# Patient Record
Sex: Female | Born: 1965 | Race: White | Hispanic: No | Marital: Married | Smoking: Never smoker
Health system: Southern US, Community
[De-identification: ages and names within clinical notes are randomized; demographics above are authoritative.]

## PROBLEM LIST (undated history)

## (undated) DIAGNOSIS — F319 Bipolar disorder, unspecified: Secondary | ICD-10-CM

---

## 1999-10-26 ENCOUNTER — Other Ambulatory Visit: Admission: RE | Admit: 1999-10-26 | Discharge: 1999-10-26 | Payer: Self-pay | Admitting: Obstetrics and Gynecology

## 2000-03-26 ENCOUNTER — Encounter: Admission: RE | Admit: 2000-03-26 | Discharge: 2000-03-26 | Payer: Self-pay | Admitting: Gastroenterology

## 2000-03-26 ENCOUNTER — Encounter: Payer: Self-pay | Admitting: Gastroenterology

## 2001-08-15 ENCOUNTER — Other Ambulatory Visit: Admission: RE | Admit: 2001-08-15 | Discharge: 2001-08-15 | Payer: Self-pay | Admitting: Obstetrics and Gynecology

## 2003-02-25 ENCOUNTER — Other Ambulatory Visit: Admission: RE | Admit: 2003-02-25 | Discharge: 2003-02-25 | Payer: Self-pay | Admitting: Obstetrics and Gynecology

## 2005-04-02 ENCOUNTER — Other Ambulatory Visit: Admission: RE | Admit: 2005-04-02 | Discharge: 2005-04-02 | Payer: Self-pay | Admitting: Obstetrics and Gynecology

## 2006-10-29 ENCOUNTER — Other Ambulatory Visit: Admission: RE | Admit: 2006-10-29 | Discharge: 2006-10-29 | Payer: Self-pay | Admitting: Family Medicine

## 2008-01-02 ENCOUNTER — Other Ambulatory Visit: Admission: RE | Admit: 2008-01-02 | Discharge: 2008-01-02 | Payer: Self-pay | Admitting: Family Medicine

## 2008-01-13 ENCOUNTER — Encounter: Admission: RE | Admit: 2008-01-13 | Discharge: 2008-01-13 | Payer: Self-pay | Admitting: Family Medicine

## 2009-08-29 ENCOUNTER — Other Ambulatory Visit
Admission: RE | Admit: 2009-08-29 | Discharge: 2009-08-29 | Payer: Self-pay | Source: Home / Self Care | Admitting: Family Medicine

## 2010-04-01 ENCOUNTER — Encounter: Payer: Self-pay | Admitting: Family Medicine

## 2010-04-02 ENCOUNTER — Encounter: Payer: Self-pay | Admitting: Family Medicine

## 2010-11-24 ENCOUNTER — Observation Stay (HOSPITAL_COMMUNITY)
Admission: EM | Admit: 2010-11-24 | Discharge: 2010-11-26 | DRG: 035 | Disposition: A | Discharge status: Home / Self Care | Payer: BC Managed Care – PPO | Attending: Diagnostic Neuroimaging | Admitting: Diagnostic Neuroimaging

## 2010-11-24 ENCOUNTER — Emergency Department (HOSPITAL_COMMUNITY): Payer: BC Managed Care – PPO

## 2010-11-24 DIAGNOSIS — R269 Unspecified abnormalities of gait and mobility: Principal | ICD-10-CM | POA: Insufficient documentation

## 2010-11-24 DIAGNOSIS — IMO0001 Reserved for inherently not codable concepts without codable children: Secondary | ICD-10-CM | POA: Insufficient documentation

## 2010-11-24 DIAGNOSIS — F411 Generalized anxiety disorder: Secondary | ICD-10-CM | POA: Insufficient documentation

## 2010-11-24 DIAGNOSIS — R5381 Other malaise: Secondary | ICD-10-CM | POA: Insufficient documentation

## 2010-11-24 DIAGNOSIS — R5383 Other fatigue: Secondary | ICD-10-CM | POA: Insufficient documentation

## 2010-11-24 DIAGNOSIS — F319 Bipolar disorder, unspecified: Secondary | ICD-10-CM | POA: Insufficient documentation

## 2010-11-24 DIAGNOSIS — R29898 Other symptoms and signs involving the musculoskeletal system: Secondary | ICD-10-CM | POA: Insufficient documentation

## 2010-11-24 DIAGNOSIS — F909 Attention-deficit hyperactivity disorder, unspecified type: Secondary | ICD-10-CM | POA: Insufficient documentation

## 2010-11-25 ENCOUNTER — Inpatient Hospital Stay (HOSPITAL_COMMUNITY): Payer: BC Managed Care – PPO

## 2010-11-25 LAB — CBC
HCT: 40 % (ref 36.0–46.0)
Hemoglobin: 13.5 g/dL (ref 12.0–15.0)
MCH: 31.9 pg (ref 26.0–34.0)
MCHC: 33.8 g/dL (ref 30.0–36.0)
MCV: 94.6 fL (ref 78.0–100.0)
Platelets: 256 10*3/uL (ref 150–400)
RBC: 4.23 MIL/uL (ref 3.87–5.11)
RDW: 12.2 % (ref 11.5–15.5)
WBC: 4.8 10*3/uL (ref 4.0–10.5)

## 2010-11-25 LAB — CSF CELL COUNT WITH DIFFERENTIAL
RBC Count, CSF: 1 /mm3 — ABNORMAL HIGH
Tube #: 1
WBC, CSF: 1 /mm3 (ref 0–5)

## 2010-11-25 LAB — COMPREHENSIVE METABOLIC PANEL
ALT: 18 U/L (ref 0–35)
AST: 21 U/L (ref 0–37)
Albumin: 3 g/dL — ABNORMAL LOW (ref 3.5–5.2)
Alkaline Phosphatase: 43 U/L (ref 39–117)
BUN: 14 mg/dL (ref 6–23)
CO2: 29 mEq/L (ref 19–32)
Calcium: 8.9 mg/dL (ref 8.4–10.5)
Chloride: 104 mEq/L (ref 96–112)
Creatinine, Ser: 0.95 mg/dL (ref 0.50–1.10)
GFR calc Af Amer: >60 mL/min (ref >60)
GFR calc non Af Amer: >60 mL/min (ref >60)
Glucose, Bld: 80 mg/dL (ref 70–99)
Potassium: 3.8 mEq/L (ref 3.5–5.1)
Sodium: 140 mEq/L (ref 135–145)
Total Bilirubin: 0.2 mg/dL — ABNORMAL LOW (ref 0.3–1.2)
Total Protein: 6 g/dL (ref 6.0–8.3)

## 2010-11-25 LAB — TSH: TSH: 1.395 u[IU]/mL (ref 0.350–4.500)

## 2010-11-25 LAB — PROTIME-INR
INR: 0.92 (ref 0.00–1.49)
Prothrombin Time: 12.6 seconds (ref 11.6–15.2)

## 2010-11-25 LAB — VITAMIN B12: Vitamin B-12: 448 pg/mL (ref 211–911)

## 2010-11-25 LAB — CRYPTOCOCCAL ANTIGEN, CSF: Crypto Ag: NEGATIVE

## 2010-11-25 LAB — PROTEIN AND GLUCOSE, CSF
Glucose, CSF: 74 mg/dL (ref 43–76)
Total  Protein, CSF: 22 mg/dL (ref 15–45)

## 2010-11-25 LAB — SEDIMENTATION RATE: Sed Rate: 2 mm/hr (ref 0–22)

## 2010-11-25 MED ORDER — GADOBENATE DIMEGLUMINE 529 MG/ML IV SOLN
13.0000 mL | Freq: Once | INTRAVENOUS | Status: AC | PRN
  Administered 2010-11-25: 13 mL via INTRAVENOUS

## 2010-11-26 NOTE — Consult Note (Signed)
  Tabitha Bennett, Tabitha Bennett NO.:  [id number]  MEDICAL RECORD NO.:  [id number]  LOCATION:  3035                         FACILITY:  [facility]  PHYSICIAN:  Thana Farr, MD    DATE OF BIRTH:  [birth year]  DATE OF PROCEDURE:  11/25/2010                                 PROCEDURE NOTE   The patient's MRI of the brain and MRI of the thoracic spine were reviewed.  Thoracic spine was only significant for an area of degenerative changes.  Cord was intact.  MRI of the brain shows some nonspecific white matter changes.  With these findings on MRI of the brain and the patient's gait, cannot rule out the possibility of MS. This was discussed with the patient.  It was determined that the patient would have a spinal tap performed this evening.  Procedure was explained to the patient and risk and benefits discussed as well.  Consent was signed.  The patient was placed in the seated position.  Area was cleaned with Betadine and anesthetized.  Under sterile conditions, 20- gauge LP needle was placed at approximately L3-L4 and 20 mL of clear and colorless fluid was obtained.  No complications were noted.  Fluid was sent for studies.          ______________________________ Thana Farr, MD     LR/MEDQ  D:  11/25/2010  T:  11/25/2010  Job:  [id number]  Electronically Signed by Thana Farr MD on 11/26/2010 09:32:07 AM

## 2010-11-27 LAB — PATHOLOGIST SMEAR REVIEW

## 2010-11-27 LAB — ANA: Anti Nuclear Antibody(ANA): NEGATIVE

## 2010-11-28 LAB — CSF IGG: IgG, CSF: 1 mg/dL (ref 0.8–7.7)

## 2010-11-29 LAB — CSF CULTURE W GRAM STAIN

## 2010-11-29 LAB — CSF CULTURE: Culture: NO GROWTH

## 2010-12-01 LAB — MYELIN BASIC PROTEIN, CSF: Myelin Basic Protein: <2 mcg/L (ref 0.0–4.0)

## 2010-12-01 LAB — OLIGOCLONAL BANDS, CSF + SERM

## 2010-12-07 NOTE — Discharge Summary (Signed)
[name], [name] NO.:  [id number]  MEDICAL RECORD NO.:  [id number]  LOCATION:  3035                         FACILITY:  [facility]  PHYSICIAN:  Tabitha Farr, MD   DATE OF BIRTH:  1966-02-13  DATE OF ADMISSION:  11/24/2010 DATE OF DISCHARGE:  11/26/2010                              DISCHARGE SUMMARY   ADMISSION DIAGNOSES: 1. Gait abnormality. 2. Bipolar disorder. 3. Attention deficit hyperactivity disorder.  DISCHARGE DIAGNOSES: 1. Gait disorder/inpatient neurological workup unremarkable.     Improved. 2. Bipolar disorder. 3. Attention deficit hyperactivity disorder.  HISTORY OF PRESENT ILLNESS:  [name] is a 45 year old female with a history of bipolar disorder and ADHD, who was in normal state of health until November 22, 2010, when she developed lower extremity weakness, shakiness, and difficulty walking.  She thinks she may have taken a double dose of Lamictal the evening prior, but could not be certain.  On Thursday evening, her symptoms were present and worsened through Friday morning. She drove herself to her primary care who evaluated her and recommended she come to emergency room for further evaluation. The patient was examined  by Dr. Marjory Bennett, and found her symptoms localizing to the thoracic or L-spine route.  She was admitted for further evaluation.  Of note, there is a family history of polymysitis, for which the patient had a negative work up  by her primary care provider, Dr. Laurann Bennett, in February 2012, including a TSH, CPK, LDH, aldolase, ANA, LFTs, and HIV.   COURSE OF HOSPITAL:  The patient was admitted to [facility] on November 24, 2010, with diagnoses of lower extremity weakness and gait disturbance.    MRI of the T-spine was negative except for mild chronic T7 inferior endplate deformity.  MRI of the L-spine without contrast was negative except for mild-to- moderate facet degeneration at L3-4 and L4-L5.  MRI of the  brain with and without contrast on November 25, 2010, showed no acute intracranial abnormality.  Mild age advanced nonspecific cerebral white matter signal changes and no associated enhancement. Otherwise, MRI normal appearance.  Laboratory studies in the hospital showed a white count of 4.8, hemoglobin 13.5, and platelets 256.  Sodium 140, potassium 3.8, BUN 14, creatinine 0.95.  LFTs within normal limits.  INR was normal at 0.92. Sed rate was 2.  TSH was 1.395 and B12 was 448.  The patient was seen by physical therapy during her hospital stay.  The patient was able to ambulate 86 feet with minimal guarded assistance. At times, she took short steps with upper extremities postured posteriorly.  She is able to change the speed of her gait with cueing, was able to take a long steps and slower steps.  There seemed to be intermittent ataxia of the lower extremities.  Fluctuating symptoms with inconsistent overall picture.  The patient was given closed chair lower extremity exercises and encouraged to ambulate with assistance.  PT will make final recommendations prior to discharge.  On the afternoon of November 25, 2010, the patient underwent lumbar puncture by Dr. Thad Bennett which was performed without difficulty.  A 20 gauge LP needle was placed at L3-L4 and 20 mL of clear colorless fluid was obtained and  sent for studies.  Initial results showed clear fluid with one wbc, one rbc, glucose of 74, and protein of 22.  Initial culture shows no organisms seen.  Cryptococcal antigen is negative. Fungal, acid fast, IgG, Lyme, myelin basic protein and oligoclonal bands are pending and will need to be followed up as an outpatient.  On November 26, 2010, the patient was feeling stable for discharge. She is ambulating without assistance, although leaning slightly to the right.  Her gait is steady.  Her strength is equal in her bilateral lower  extremities and deep tendon reflexes are 2-3+ in the  patellars and two plus at the Achilles.  She does complain of some left-sided neck pain which keeps her from turning her head freely.  This can be evaluated further as an outpatient.  She does have some tenderness around the trapezius muscles, the left side of the neck.  DISCHARGE/PLAN:   1. The patient will be discharged to home.  2. We will plan for outpatient neurology followup.   3. I will call physical therapy prior to her discharge to see if she requires any DME or additional outpatient physical therapy.  The patient was encouraged to perform the exercises as recommended by physical therapy.  4. No changes to her medications made.   5. 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  6. If she has any problems or questions in the interim, she needs to call her primary care provider, Dr. Cliffton Bennett or [facility].     Tabitha Bennett, P.A.   ______________________________ Tabitha Farr, MD    TCJ/MEDQ  D:  11/26/2010  T:  11/26/2010  Job:  [id number]  cc:   Tabitha Bennett, M.D. Dr. Kandace Parkins, MD  Electronically Signed by Tabitha Bennett P.A. on 11/28/2010 06:51:34 PM Electronically Signed by Tabitha Farr MD on 12/07/2010 03:18:37 PM

## 2010-12-08 LAB — B. BURGDORFI ANTIBODIES, CSF

## 2010-12-24 LAB — FUNGUS CULTURE W SMEAR: Fungal Smear: NONE SEEN

## 2010-12-29 NOTE — H&P (Signed)
Tabitha Bennett, Tabitha Bennett            ACCOUNT NO.:  [id number]  MEDICAL RECORD NO.:  [id number]  LOCATION:  [facility]                         FACILITY:  [facility]  PHYSICIAN:  Joycelyn Schmid, MD   DATE OF BIRTH:  Mar 12, 1966  DATE OF ADMISSION:  11/24/2010 DATE OF DISCHARGE:                             HISTORY & PHYSICAL   CHIEF COMPLAINT:  Lower extremity weakness and gait difficulty.  HISTORY OF PRESENT ILLNESS:  A 45 year old female with history of bipolar disorder, ADHD, who was in normal state of health until November 22, 2010, when she developed lower extremity weakness, shakiness, and difficulty walking.  She thinks she may have taken double the dose of Lamictal in that evening, but she cannot recall.  The next morning, she felt better throughout the day.  On Thursday, her symptoms returned.  On Friday morning, today, the patient's lower extremity weakness worsened again.  She drove herself to her primary care physician who evaluated her and recommended that she come to the emergency room for further evaluation.  Of note, the day prior to her evaluation, the patient had seen her primary care physician, Laurann Montana and was inquiring about her diffuse muscle weakness and fatigue. The patient's grandmother had diagnosis of polymyositis.  Then, the patient has had generalized muscle fatigue since February 2012. Therefore, she was evaluated with screening lab work including TSH, CPK, LDH, aldolase, ANA, LFTs, HIV; all of which were negative.  The patient has been under increased stress related to her work as a Education officer, [employer].  PAST MEDICAL HISTORY:  Bipolar disorder and ADHD.  MEDICATIONS: 1. Vyvanse 50 mg daily. 2. Lamictal 100 mg in the morning, 2 mg at night. 3. Citalopram 20 mg daily. 4. Xanax 0.5 mg 3 times a day.  ALLERGIES:  EFFEXOR, NUVIGIL, PAXIL, FLUVOXAMINE, ABILIFY.  FAMILY HISTORY:  Polymyositis in her grandmother.  SOCIAL HISTORY:  She lives with her  husband and 2 children.  Denies tobacco use.  She has rare alcohol use.  No illicit drug use.  REVIEW OF SYSTEMS:  As per the HPI.  No recent fevers, chills, nausea, vomiting, diarrhea, rash, chest pain.  She does have some shortness of breath on exertion that has been going on since February 2012.  She had a vaccination 1 month ago.  PHYSICAL EXAMINATION:  VITAL SIGNS:  Temperature 98.2, blood pressure 104/72, heart rate 76, respirations 16, O2 sat 100% on room air. GENERAL:  Awake and alert, thin female. NEUROLOGIC:  Language is fluent and comprehension intact.  Cranial nerve examination; pupils reactive from 3-2 mm.  Visual fields full to confrontation.  Extraocular muscles intact.  Facial sensation and strength is symmetric.  Uvula is midline.  Shoulders are symmetric. Tongue is midline.  Motor examination; normal bulk and tone, about 5/5 strength bilateral upper extremities.  Mild weakness in the triceps bilaterally.  Bilateral lower extremities give way.  Weakness of fluctuation in the bilateral hip flexors, knee flexors, and knee extension.  5/5 strength distally and dorsiflexion and plantar flexion of the foot.  Leg abduction is 5/5, leg adduction fluctuates.  At times, she is able to give momentary 5/5 strength.  Sensory examination intact to light touch, pinprick, temperature, vibration, and  proprioception. Reflexes 2+ in the arms, 3 at the knees, 2 at the ankles.  Downgoing toes.  Coordination testing, finger-nose-finger smooth and symmetric. Heel-to-shin is limited by the patient's weakness, gait.  The patient is able to sit at the bedside without difficulty.  Once she places her feet on the ground, she stoops over and begins to shake with her upper and lower body.  When I told the patient to relax, take deep breaths, and stand up right, she is able to do so without shaking.  When I ask her to take some steps, she finds her foot forward along the ground one at a time with  small steps.  When I ask her to take steps by picking her feet up off the ground, she is able to do so, although very cautiously.  She has a narrow-based gait.  Overall, I would characterize her gait and station as astasia-abasia.  She is able to take a few steps on her toes. She is able to tandem.  TEST RESULTS:  Outside lab testing includes LFTs, TSH, CPK, LDH, aldolase, ANA, HIV; all of which are negative.  MRI of the lumbar spine which I reviewed shows no spinal stenosis or foraminal narrowing.  Study is essentially normal.  ASSESSMENT AND PLAN:  A 45 year old female with bipolar disorder, attention deficit hyperactivity disorder, with new-onset lower extremity weakness.  Symptoms localize to thoracic spine or lumbosacral roots. Examination is quite variable with some functional overlay.  DIFFERENTIAL DIAGNOSES:  Thoracic spine process, Guillain-Barre syndrome, conversion reaction.  RECOMMENDATIONS:  MRI of the thoracic spine, lumbar puncture, PT evaluation.  After this testing, the patient agreed for admission.     Joycelyn Schmid, MD     VP/MEDQ  D:  11/24/2010  T:  11/24/2010  Job:  [id number]  Electronically Signed by Joycelyn Schmid  on 12/29/2010 05:44:42 PM

## 2011-01-09 LAB — AFB CULTURE WITH SMEAR (NOT AT ARMC): Acid Fast Smear: NONE SEEN

## 2012-06-24 ENCOUNTER — Other Ambulatory Visit: Payer: Self-pay | Admitting: Dermatology

## 2012-08-08 ENCOUNTER — Other Ambulatory Visit (HOSPITAL_COMMUNITY)
Admission: RE | Admit: 2012-08-08 | Discharge: 2012-08-08 | Disposition: A | Discharge status: Home / Self Care | Payer: BC Managed Care – PPO | Source: Ambulatory Visit | Attending: Family Medicine | Admitting: Family Medicine

## 2012-08-08 ENCOUNTER — Other Ambulatory Visit: Payer: Self-pay | Admitting: Family Medicine

## 2012-08-08 DIAGNOSIS — Z0000 Encounter for general adult medical examination without abnormal findings: Secondary | ICD-10-CM | POA: Insufficient documentation

## 2014-07-22 ENCOUNTER — Other Ambulatory Visit: Payer: Self-pay | Admitting: Family Medicine

## 2014-07-22 ENCOUNTER — Other Ambulatory Visit (HOSPITAL_COMMUNITY)
Admission: RE | Admit: 2014-07-22 | Discharge: 2014-07-22 | Disposition: A | Discharge status: Home / Self Care | Payer: 59 | Source: Ambulatory Visit | Attending: Family Medicine | Admitting: Family Medicine

## 2014-07-22 DIAGNOSIS — Z124 Encounter for screening for malignant neoplasm of cervix: Secondary | ICD-10-CM | POA: Diagnosis not present

## 2014-07-23 LAB — CYTOLOGY - PAP

## 2014-10-27 DIAGNOSIS — Z85828 Personal history of other malignant neoplasm of skin: Secondary | ICD-10-CM | POA: Insufficient documentation

## 2015-03-17 ENCOUNTER — Other Ambulatory Visit: Payer: Self-pay | Admitting: Family Medicine

## 2015-03-17 ENCOUNTER — Ambulatory Visit
Admission: RE | Admit: 2015-03-17 | Discharge: 2015-03-17 | Disposition: A | Discharge status: Home / Self Care | Payer: BLUE CROSS/BLUE SHIELD | Source: Ambulatory Visit | Attending: Family Medicine | Admitting: Family Medicine

## 2015-03-17 DIAGNOSIS — R0600 Dyspnea, unspecified: Secondary | ICD-10-CM

## 2016-02-20 DIAGNOSIS — Z719 Counseling, unspecified: Secondary | ICD-10-CM | POA: Insufficient documentation

## 2016-04-22 ENCOUNTER — Emergency Department (HOSPITAL_COMMUNITY): Payer: BLUE CROSS/BLUE SHIELD

## 2016-04-22 ENCOUNTER — Encounter (HOSPITAL_COMMUNITY): Payer: Self-pay | Admitting: Emergency Medicine

## 2016-04-22 ENCOUNTER — Emergency Department (HOSPITAL_COMMUNITY)
Admission: EM | Admit: 2016-04-22 | Discharge: 2016-04-23 | Disposition: A | Discharge status: Home / Self Care | Payer: BLUE CROSS/BLUE SHIELD | Attending: Emergency Medicine | Admitting: Emergency Medicine

## 2016-04-22 DIAGNOSIS — R260 Ataxic gait: Secondary | ICD-10-CM | POA: Diagnosis not present

## 2016-04-22 DIAGNOSIS — R270 Ataxia, unspecified: Secondary | ICD-10-CM

## 2016-04-22 DIAGNOSIS — R269 Unspecified abnormalities of gait and mobility: Secondary | ICD-10-CM

## 2016-04-22 DIAGNOSIS — R262 Difficulty in walking, not elsewhere classified: Secondary | ICD-10-CM | POA: Diagnosis present

## 2016-04-22 HISTORY — DX: Bipolar disorder, unspecified: F31.9

## 2016-04-22 LAB — CBC
HCT: 40.2 % (ref 36.0–46.0)
Hemoglobin: 13.5 g/dL (ref 12.0–15.0)
MCH: 32.4 pg (ref 26.0–34.0)
MCHC: 33.6 g/dL (ref 30.0–36.0)
MCV: 96.4 fL (ref 78.0–100.0)
Platelets: 264 10*3/uL (ref 150–400)
RBC: 4.17 MIL/uL (ref 3.87–5.11)
RDW: 12.4 % (ref 11.5–15.5)
WBC: 5.8 10*3/uL (ref 4.0–10.5)

## 2016-04-22 LAB — URINALYSIS, ROUTINE W REFLEX MICROSCOPIC
Bilirubin Urine: NEGATIVE
Glucose, UA: NEGATIVE mg/dL
Hgb urine dipstick: NEGATIVE
Ketones, ur: NEGATIVE mg/dL
Nitrite: NEGATIVE
Protein, ur: NEGATIVE mg/dL
Specific Gravity, Urine: 1.005 (ref 1.005–1.030)
pH: 6 (ref 5.0–8.0)

## 2016-04-22 LAB — BASIC METABOLIC PANEL
Anion gap: 6 (ref 5–15)
BUN: 10 mg/dL (ref 6–20)
CO2: 28 mmol/L (ref 22–32)
Calcium: 9.1 mg/dL (ref 8.9–10.3)
Chloride: 103 mmol/L (ref 101–111)
Creatinine, Ser: 1.02 mg/dL — ABNORMAL HIGH (ref 0.44–1.00)
GFR calc Af Amer: >60 mL/min (ref >60)
GFR calc non Af Amer: >60 mL/min (ref >60)
Glucose, Bld: 110 mg/dL — ABNORMAL HIGH (ref 65–99)
Potassium: 3.8 mmol/L (ref 3.5–5.1)
Sodium: 137 mmol/L (ref 135–145)

## 2016-04-22 LAB — I-STAT BETA HCG BLOOD, ED (MC, WL, AP ONLY): I-stat hCG, quantitative: <5 m[IU]/mL (ref <5)

## 2016-04-22 LAB — CBG MONITORING, ED: Glucose-Capillary: 93 mg/dL (ref 65–99)

## 2016-04-22 LAB — LITHIUM LEVEL: Lithium Lvl: <0.06 mmol/L — ABNORMAL LOW (ref 0.60–1.20)

## 2016-04-22 NOTE — ED Provider Notes (Signed)
[facility]-EMERGENCY DEPT Provider Note   CSN: [id number] Arrival date & time: 04/22/16  1352     History   Chief Complaint Chief Complaint  Patient presents with  . Weakness    HPI Tabitha Bennett is a 51 y.o. female.  Patient with history of bipolar disorder on Lamictal and escitalopram for 20 years -- presents with trouble walking and lower extremity weakness. Patient reports having difficulty with her memory over the past 1 year. Patient saw an integrative medicine specialist 2 days ago prescribed multiple supplements which she took once 2 days ago. Upon awaking yesterday, patient got out of bed and had difficulty with ambulation. She feels like her legs are rigid. She has not fallen. She does not feel particularly off-balance. She has not had symptoms like this in the past. She denies difficulty speaking, facial droop. She does not feel like her legs are weak. She has significant tremors when she walks or stands up. Patient reports having a minor neck injury in December after lifting heavy object. Patient had some minimal tingling in her right arm after this this resolved with chiropractic treatment. She has had continued minor right neck soreness. The onset of this condition was acute. The course is constant. Aggravating factors: none. Alleviating factors: none.   Supplements: super omega 3, lithium orotate, daily vitamin, pregnenolone, ashwaghanda, vitamin E, naltrexone, vitamin C, taurine, co q10, topical magnesium, chinese skullcap, alpha lipoic acid, l-acetyl carnitine, niacinamide, CBD capsule, benfotiamine, pantothenic acid, vinpocetine, bacopa, melatonin, 5-HTP, coriander.       Past Medical History:  Diagnosis Date  . Bipolar 1 disorder (HCC)     There are no active problems to display for this patient.   History reviewed. No pertinent surgical history.  OB History    No data available       Home Medications    Prior to Admission medications   Not on File     Family History No family history on file.  Social History Social History  Substance Use Topics  . Smoking status: Never Smoker  . Smokeless tobacco: Never Used  . Alcohol use Yes     Comment: 1 glass of wine per week     Allergies   Patient has no allergy information on record.   Review of Systems Review of Systems  Constitutional: Negative for fever.  HENT: Negative for congestion, dental problem, rhinorrhea, sinus pressure and sore throat.   Eyes: Negative for photophobia, discharge, redness and visual disturbance.  Respiratory: Negative for cough and shortness of breath.   Cardiovascular: Negative for chest pain.  Gastrointestinal: Negative for abdominal pain, diarrhea, nausea and vomiting.  Genitourinary: Negative for dysuria.  Musculoskeletal: Positive for gait problem. Negative for myalgias, neck pain and neck stiffness.  Skin: Negative for rash.  Neurological: Positive for tremors (with standing/walking) and weakness. Negative for dizziness, seizures, syncope, facial asymmetry, speech difficulty, light-headedness, numbness and headaches.  Psychiatric/Behavioral: Positive for decreased concentration. Negative for confusion.     Physical Exam Updated Vital Signs BP 124/80 (BP Location: Right Arm)   Pulse 69   Temp 98.4 F (36.9 C) (Oral)   Resp 12   Ht 5\' 7"  (1.702 m)   Wt 59.6 kg   SpO2 99%   BMI 20.60 kg/m   Physical Exam  Constitutional: She is oriented to person, place, and time. She appears well-developed and well-nourished.  HENT:  Head: Normocephalic and atraumatic.  Right Ear: Tympanic membrane, external ear and ear canal normal.  Left Ear: Tympanic  membrane, external ear and ear canal normal.  Nose: Nose normal.  Mouth/Throat: Uvula is midline, oropharynx is clear and moist and mucous membranes are normal.  Eyes: Conjunctivae, EOM and lids are normal. Pupils are equal, round, and reactive to light. Right eye exhibits no nystagmus. Left eye  exhibits no nystagmus.  Neck: Normal range of motion. Neck supple.  Cardiovascular: Normal rate and regular rhythm.   Pulmonary/Chest: Effort normal and breath sounds normal.  Abdominal: Soft. There is no tenderness.  Musculoskeletal:       Cervical back: She exhibits normal range of motion, no tenderness and no bony tenderness.  Neurological: She is alert and oriented to person, place, and time. She has normal strength. No cranial nerve deficit or sensory deficit. She displays a negative Romberg sign. Coordination and gait normal. GCS eye subscore is 4. GCS verbal subscore is 5. GCS motor subscore is 6.  Reflex Scores:      Tricep reflexes are 2+ on the right side and 2+ on the left side.      Brachioradialis reflexes are 2+ on the right side and 2+ on the left side.      Patellar reflexes are 4+ on the right side and 4+ on the left side. Normal finger-to-nose. Patient can stand without difficulty. Can stay upright with difficulty with Romberg. Walks with shaking in her legs, slides her feet along the floor slowly. Normal distal sensation. Cranial nerves intact.   Skin: Skin is warm and dry.  Psychiatric: She has a normal mood and affect.  Nursing note and vitals reviewed.    ED Treatments / Results  Labs (all labs ordered are listed, but only abnormal results are displayed) Labs Reviewed  BASIC METABOLIC PANEL - Abnormal; Notable for the following:       Result Value   Glucose, Bld 110 (*)    Creatinine, Ser 1.02 (*)    All other components within normal limits  URINALYSIS, ROUTINE W REFLEX MICROSCOPIC - Abnormal; Notable for the following:    Color, Urine STRAW (*)    Leukocytes, UA TRACE (*)    Bacteria, UA FEW (*)    Squamous Epithelial / LPF 0-5 (*)    All other components within normal limits  LITHIUM LEVEL - Abnormal; Notable for the following:    Lithium Lvl <0.06 (*)    All other components within normal limits  CBC  VITAMIN B12  ZINC  COPPER, SERUM  CBG MONITORING,  ED  I-STAT BETA HCG BLOOD, ED ([facility], [facility], [facility] ONLY)    Radiology Ct Head Wo Contrast  Result Date: 04/22/2016 CLINICAL DATA:  Lower extremity weakness, ataxia EXAM: CT HEAD WITHOUT CONTRAST TECHNIQUE: Contiguous axial images were obtained from the base of the skull through the vertex without intravenous contrast. COMPARISON:  None. FINDINGS: Brain: No evidence of acute infarction, hemorrhage, hydrocephalus, extra-axial collection or mass lesion/mass effect. Subcortical white matter and periventricular small vessel ischemic changes. Vascular: No hyperdense vessel or unexpected calcification. Skull: Normal. Negative for fracture or focal lesion. Sinuses/Orbits: No acute finding. Other: None. IMPRESSION: No evidence of acute intracranial abnormality. Mild small vessel ischemic changes. Electronically Signed   By: Charline Bills M.D.   On: 04/22/2016 17:47   Mr Brain Wo Contrast  Result Date: 04/22/2016 CLINICAL DATA:  51 y/o F; weakness, ataxia, bilateral leg weakness, and unstable gait. EXAM: MRI HEAD WITHOUT CONTRAST TECHNIQUE: Multiplanar, multiecho pulse sequences of the brain and surrounding structures were obtained without intravenous contrast. COMPARISON:  11/25/2010 MRI of the  head. FINDINGS: Brain: Enlarged pituitary measuring 11 mm craniocaudal with convex superior margin, possibly in contact with the optic chiasm. In comparison with prior MRI this may be minimally increased in size. Nonspecific scattered foci of T2 FLAIR hyperintense foci predominantly in subcortical white matter, with few foci in the left cerebellar hemisphere, is are mildly progressed in comparison with the prior MRI of the brain. No lesion is identified within the brainstem, basal ganglia, or corpus callosum. No focal mass effect. No abnormal susceptibility hypointensity to indicate intracranial hemorrhage. No diffusion signal abnormality. No extra-axial collection. No hydrocephalus. Vascular: Normal flow voids. Skull and upper  cervical spine: Normal marrow signal. Sinuses/Orbits: Negative. Other: None. IMPRESSION: 1. No acute intracranial abnormality identified. 2. Foci of white matter T2 FLAIR hyperintensity probably represent mild chronic microvascular ischemic changes and are mildly progressed from 2012. 3. Enlarged pituitary gland may represent an underlying pituitary adenoma, stable to minimally increased in size from 2012. This can be further characterized with a pituitary protocol MRI of the brain with and without intravenous contrast. Electronically Signed   By: Mitzi Hansen M.D.   On: 04/22/2016 22:46   Mr Cervical Spine Wo Contrast  Result Date: 04/22/2016 CLINICAL DATA:  51 y/o  F; lower extremity weakness and ataxia. EXAM: MRI CERVICAL, THORACIC AND LUMBAR SPINE WITHOUT CONTRAST TECHNIQUE: Multiplanar and multiecho pulse sequences of the cervical spine, to include the craniocervical junction and cervicothoracic junction, and thoracic and lumbar spine, were obtained without intravenous contrast. COMPARISON:  Concurrent MRI of the brain. 11/24/2010 lumbar MRI. 11/25/2010 thoracic MRI. FINDINGS: MRI CERVICAL SPINE FINDINGS Alignment: Mild reversal of cervical lordosis and grade 1 degenerative anterolisthesis at C3-4. Vertebrae: No fracture, evidence of discitis, or bone lesion. Cord: No abnormal cord signal. Posterior Fossa, vertebral arteries, paraspinal tissues: Negative. Disc levels: C2-3: No significant disc displacement, foraminal narrowing, or canal stenosis. Mild facet hypertrophy. C3-4: Anterolisthesis with small uncovered disc bulge and mild facet hypertrophy. Mild bilateral foraminal narrowing. No significant canal stenosis. C4-5: No significant disc displacement, foraminal narrowing, or canal stenosis. C5-6: Small disc osteophyte complex with uncovertebral and facet hypertrophy. Moderate left and mild right foraminal narrowing. Mild canal stenosis. C6-7: Disc osteophyte complex eccentric to the right  and right-sided uncovertebral hypertrophy. Moderate right foraminal narrowing. No significant left foraminal narrowing or canal stenosis. C7-T1: No significant disc displacement, foraminal narrowing, or canal stenosis. MRI THORACIC SPINE FINDINGS Alignment:  Physiologic. Vertebrae: Stable T7 inferior endplate Schmorl's node. No evidence of acute discitis, fracture, or bone lesion. Cord:  Normal signal and morphology. Paraspinal and other soft tissues: Negative. Disc levels: Tiny T8-9 and T11-12 right subarticular disc protrusion without cord or nerve root contact are new. MRI LUMBAR SPINE FINDINGS Segmentation:  Standard. Alignment:  Physiologic. Vertebrae:  No fracture, evidence of discitis, or bone lesion. Conus medullaris: Extends to the L1 level and appears normal. Paraspinal and other soft tissues: Negative. Disc levels: L1-2: No significant disc displacement, foraminal narrowing, or canal stenosis. L2-3: Small disc bulge eccentric to the left. No significant foraminal narrowing or canal stenosis. L3-4: Increased moderate disc bulge eccentric to the left with moderate facet and ligamentum flavum hypertrophy. Mild bilateral foraminal and lateral recess narrowing. No significant canal stenosis. L4-5: Increased moderate disc bulge with small central protrusion and annular fissure combined with moderate facet and ligamentum flavum hypertrophy. Mild bilateral foraminal and lateral recess narrowing with mild canal stenosis. L5-S1: Minimal disc bulge and mild facet hypertrophy. No significant foraminal narrowing or canal stenosis. IMPRESSION: 1. No acute osseous abnormality  or abnormal cord signal of the cervical, thoracic, and lumbar spine. 2. No high-grade canal stenosis or foraminal narrowing of the cervical, thoracic, and lumbar spine. 3. Multilevel cervical spondylosis greatest at the C5-6 level where there is moderate left mild right foraminal narrowing and mild canal stenosis. 4. New tiny T8-9 and T11-12 right  subarticular disc protrusions without cord or nerve root contact. 5. Progression of lumbar spondylosis at L3-4 where there is mild bilateral foraminal narrowing and at L4-5 where there is mild foraminal narrowing and mild canal stenosis. Electronically Signed   By: Mitzi Hansen M.D.   On: 04/22/2016 23:03   Mr Thoracic Spine Wo Contrast  Result Date: 04/22/2016 CLINICAL DATA:  51 y/o  F; lower extremity weakness and ataxia. EXAM: MRI CERVICAL, THORACIC AND LUMBAR SPINE WITHOUT CONTRAST TECHNIQUE: Multiplanar and multiecho pulse sequences of the cervical spine, to include the craniocervical junction and cervicothoracic junction, and thoracic and lumbar spine, were obtained without intravenous contrast. COMPARISON:  Concurrent MRI of the brain. 11/24/2010 lumbar MRI. 11/25/2010 thoracic MRI. FINDINGS: MRI CERVICAL SPINE FINDINGS Alignment: Mild reversal of cervical lordosis and grade 1 degenerative anterolisthesis at C3-4. Vertebrae: No fracture, evidence of discitis, or bone lesion. Cord: No abnormal cord signal. Posterior Fossa, vertebral arteries, paraspinal tissues: Negative. Disc levels: C2-3: No significant disc displacement, foraminal narrowing, or canal stenosis. Mild facet hypertrophy. C3-4: Anterolisthesis with small uncovered disc bulge and mild facet hypertrophy. Mild bilateral foraminal narrowing. No significant canal stenosis. C4-5: No significant disc displacement, foraminal narrowing, or canal stenosis. C5-6: Small disc osteophyte complex with uncovertebral and facet hypertrophy. Moderate left and mild right foraminal narrowing. Mild canal stenosis. C6-7: Disc osteophyte complex eccentric to the right and right-sided uncovertebral hypertrophy. Moderate right foraminal narrowing. No significant left foraminal narrowing or canal stenosis. C7-T1: No significant disc displacement, foraminal narrowing, or canal stenosis. MRI THORACIC SPINE FINDINGS Alignment:  Physiologic. Vertebrae: Stable  T7 inferior endplate Schmorl's node. No evidence of acute discitis, fracture, or bone lesion. Cord:  Normal signal and morphology. Paraspinal and other soft tissues: Negative. Disc levels: Tiny T8-9 and T11-12 right subarticular disc protrusion without cord or nerve root contact are new. MRI LUMBAR SPINE FINDINGS Segmentation:  Standard. Alignment:  Physiologic. Vertebrae:  No fracture, evidence of discitis, or bone lesion. Conus medullaris: Extends to the L1 level and appears normal. Paraspinal and other soft tissues: Negative. Disc levels: L1-2: No significant disc displacement, foraminal narrowing, or canal stenosis. L2-3: Small disc bulge eccentric to the left. No significant foraminal narrowing or canal stenosis. L3-4: Increased moderate disc bulge eccentric to the left with moderate facet and ligamentum flavum hypertrophy. Mild bilateral foraminal and lateral recess narrowing. No significant canal stenosis. L4-5: Increased moderate disc bulge with small central protrusion and annular fissure combined with moderate facet and ligamentum flavum hypertrophy. Mild bilateral foraminal and lateral recess narrowing with mild canal stenosis. L5-S1: Minimal disc bulge and mild facet hypertrophy. No significant foraminal narrowing or canal stenosis. IMPRESSION: 1. No acute osseous abnormality or abnormal cord signal of the cervical, thoracic, and lumbar spine. 2. No high-grade canal stenosis or foraminal narrowing of the cervical, thoracic, and lumbar spine. 3. Multilevel cervical spondylosis greatest at the C5-6 level where there is moderate left mild right foraminal narrowing and mild canal stenosis. 4. New tiny T8-9 and T11-12 right subarticular disc protrusions without cord or nerve root contact. 5. Progression of lumbar spondylosis at L3-4 where there is mild bilateral foraminal narrowing and at L4-5 where there is mild foraminal narrowing and  mild canal stenosis. Electronically Signed   By: Mitzi Hansen  M.D.   On: 04/22/2016 23:03   Mr Lumbar Spine Wo Contrast  Result Date: 04/22/2016 CLINICAL DATA:  51 y/o  F; lower extremity weakness and ataxia. EXAM: MRI CERVICAL, THORACIC AND LUMBAR SPINE WITHOUT CONTRAST TECHNIQUE: Multiplanar and multiecho pulse sequences of the cervical spine, to include the craniocervical junction and cervicothoracic junction, and thoracic and lumbar spine, were obtained without intravenous contrast. COMPARISON:  Concurrent MRI of the brain. 11/24/2010 lumbar MRI. 11/25/2010 thoracic MRI. FINDINGS: MRI CERVICAL SPINE FINDINGS Alignment: Mild reversal of cervical lordosis and grade 1 degenerative anterolisthesis at C3-4. Vertebrae: No fracture, evidence of discitis, or bone lesion. Cord: No abnormal cord signal. Posterior Fossa, vertebral arteries, paraspinal tissues: Negative. Disc levels: C2-3: No significant disc displacement, foraminal narrowing, or canal stenosis. Mild facet hypertrophy. C3-4: Anterolisthesis with small uncovered disc bulge and mild facet hypertrophy. Mild bilateral foraminal narrowing. No significant canal stenosis. C4-5: No significant disc displacement, foraminal narrowing, or canal stenosis. C5-6: Small disc osteophyte complex with uncovertebral and facet hypertrophy. Moderate left and mild right foraminal narrowing. Mild canal stenosis. C6-7: Disc osteophyte complex eccentric to the right and right-sided uncovertebral hypertrophy. Moderate right foraminal narrowing. No significant left foraminal narrowing or canal stenosis. C7-T1: No significant disc displacement, foraminal narrowing, or canal stenosis. MRI THORACIC SPINE FINDINGS Alignment:  Physiologic. Vertebrae: Stable T7 inferior endplate Schmorl's node. No evidence of acute discitis, fracture, or bone lesion. Cord:  Normal signal and morphology. Paraspinal and other soft tissues: Negative. Disc levels: Tiny T8-9 and T11-12 right subarticular disc protrusion without cord or nerve root contact are new. MRI  LUMBAR SPINE FINDINGS Segmentation:  Standard. Alignment:  Physiologic. Vertebrae:  No fracture, evidence of discitis, or bone lesion. Conus medullaris: Extends to the L1 level and appears normal. Paraspinal and other soft tissues: Negative. Disc levels: L1-2: No significant disc displacement, foraminal narrowing, or canal stenosis. L2-3: Small disc bulge eccentric to the left. No significant foraminal narrowing or canal stenosis. L3-4: Increased moderate disc bulge eccentric to the left with moderate facet and ligamentum flavum hypertrophy. Mild bilateral foraminal and lateral recess narrowing. No significant canal stenosis. L4-5: Increased moderate disc bulge with small central protrusion and annular fissure combined with moderate facet and ligamentum flavum hypertrophy. Mild bilateral foraminal and lateral recess narrowing with mild canal stenosis. L5-S1: Minimal disc bulge and mild facet hypertrophy. No significant foraminal narrowing or canal stenosis. IMPRESSION: 1. No acute osseous abnormality or abnormal cord signal of the cervical, thoracic, and lumbar spine. 2. No high-grade canal stenosis or foraminal narrowing of the cervical, thoracic, and lumbar spine. 3. Multilevel cervical spondylosis greatest at the C5-6 level where there is moderate left mild right foraminal narrowing and mild canal stenosis. 4. New tiny T8-9 and T11-12 right subarticular disc protrusions without cord or nerve root contact. 5. Progression of lumbar spondylosis at L3-4 where there is mild bilateral foraminal narrowing and at L4-5 where there is mild foraminal narrowing and mild canal stenosis. Electronically Signed   By: Mitzi Hansen M.D.   On: 04/22/2016 23:03    Procedures Procedures (including critical care time)  Medications Ordered in ED Medications - No data to display   Initial Impression / Assessment and Plan / ED Course  I have reviewed the triage vital signs and the nursing notes.  Pertinent labs &  imaging results that were available during my care of the patient were reviewed by me and considered in my medical decision making (see  chart for details).     Patient seen and examined. Work-up initiated.   Vital signs reviewed and are as follows: BP 124/80 (BP Location: Right Arm)   Pulse 69   Temp 98.4 F (36.9 C) (Oral)   Resp 12   Ht 5\' 7"  (1.702 m)   Wt 59.6 kg   SpO2 99%   BMI 20.60 kg/m   Discussed with Dr. Juleen ChinaKohut who will see.   6:44 PM Head CT neg. Pt stable. Spoke with Dr. Grace Isaacoqeer. Advises re-imaging of the spine and brain with MRI.   Spoke later with Dr. Otelia LimesLindzen. Discussed plan for imaging. If neg, can discharge with outpatient neurology follow-up. Encouraged adding a B12 level, zinc, copper. If any grossly abnormal findings, will call back.  12:13 AM discussed MRI findings with patient and husband at bedside. We discussed minor disc bulges, white matter changes, slightly enlarged pituitary gland. Discussed that these were unlikely to cause her current symptoms.  Discussed that at this point, if patient feels comfortable, we can discharge her to home. She states that she has good support at home and feels safe with this. She will stop all supplements. [facility]. Patient encouraged to call for an appointment as well. Patient encouraged to follow-up with her primary care physician.  We discussed at length that symptoms can be unpredictable. Given findings tonight, feel that she is low risk overall for symptom progression and worsening, but I cannot assure her that symptoms won't progress. However, if she does experience any worsening, including worsening weakness, inability to walk, difficulty breathing, that she would need to return to the emergency department immediately. She states that she understands and seems reliable to do this.  Final Clinical Impressions(s) / ED Diagnoses   Final diagnoses:  Ataxia  Abnormality of gait   Patient with gait  difficulty starting acutely yesterday. Patient had similar symptoms in 2012. Prior to this occurrence, patient took multiple different supplements documented above. Unclear if any interactions are contributing. Neuro input appreciated. Imaging performed tonight without any findings that would require inpatient admission or neurosurgery consultation. Patient does not seem to be having ascending paralysis consistent with Guillain-Barr, however patient will need close neuro follow-up. We discussed return instructions as above. Patient ambulated without assistance prior to discharge.   New Prescriptions New Prescriptions   No medications on file     Renne CriglerJoshua Sherronda Sweigert, PA-C 04/23/16 0017    Raeford RazorStephen Kohut, MD 04/30/16 [phone]

## 2016-04-22 NOTE — ED Notes (Signed)
PT unable to give urine sample at this time.Will info staff when able to give

## 2016-04-22 NOTE — ED Provider Notes (Signed)
Medical screening examination/treatment/procedure(s) were conducted as a shared visit with non-physician practitioner(s) and myself.  I personally evaluated the patient during the encounter.   EKG Interpretation None      50yF with ambulatory dysfunction. On exam, she has hyperreflexia in b/l LE. Muscle tone seems normal and strength is normal but movements are somewhat spastic. +Rhomberg. Toes down going.   She denies back pain or acute trauma. No urinary retention/incontinence. Has neck pain but this sounds more chronic in nature and, if this is a spinal cord issue, symptoms correspond to a lower process.   Interestingly, she reports similar symptoms previously but not quite as severe. Per review of records, in 2012 she was seen by neurology. W/u included LP, MR thoracic/lumbar spine and brain. No clear etiology and symptoms resolved.   Symptoms/exam interesting but I don't have a clear explanation. Will discuss with neurology.    Raeford RazorStephen Jamaine Quintin, MD 04/22/16 1655

## 2016-04-22 NOTE — ED Triage Notes (Addendum)
Patient seen by a doctor and he prescribed her 22 different vitamins/herbs on Friday. Reports Saturday morning she began to feel weak and has a shuffling gait. Patient alert and oriented. Patient able to move all extremities and follow commands.

## 2016-04-22 NOTE — ED Notes (Signed)
Pt has been able to ambulate to the bathroom and back to her room with assistance by family member.

## 2016-04-22 NOTE — ED Notes (Signed)
ED Provider at bedside. 

## 2016-04-23 LAB — VITAMIN B12: Vitamin B-12: 1476 pg/mL — ABNORMAL HIGH (ref 180–914)

## 2016-04-23 NOTE — Discharge Instructions (Signed)
Please read and follow all provided instructions.  Your diagnoses today include:  1. Abnormality of gait   2. Ataxia     Tests performed today include:  CT scan of your head that did not show any serious injury.  MRI of your brain and spine - your pituitary is slightly enlarged, you have some small disc bulges, no findings that explain your trouble walking  Blood counts and electrolytes  Vital signs. See below for your results today.   Medications prescribed:   None  Take any prescribed medications only as directed.  Home care instructions:  Follow any educational materials contained in this packet.  Follow-up instructions: Please follow-up with your primary care provider in the next 3 days for further evaluation of your symptoms. You should also follow-up with the neurologist for further evaluation.   Return instructions:  SEEK IMMEDIATE MEDICAL ATTENTION IF:  You notice dizziness or unsteadiness which is getting worse, or inability to walk.   You have convulsions or unconsciousness.   You experience severe, persistent headaches not relieved by Tylenol.  There are changes in pupil sizes. (This is the black center in the colored part of the eye)   There is clear or bloody discharge from the nose or ears.   You have change in speech, vision, swallowing, or understanding.   Localized weakness, numbness, tingling, or change in bowel or bladder control.  You have any other emergent concerns.  Your vital signs today were: BP 114/76 (BP Location: Right Arm)    Pulse 62    Temp 98.4 F (36.9 C) (Oral)    Resp 20    Ht 5\' 7"  (1.702 m)    Wt 59.6 kg    SpO2 99%    BMI 20.60 kg/m  If your blood pressure (BP) was elevated above 135/85 this visit, please have this repeated by your doctor within one month. --------------

## 2016-04-25 ENCOUNTER — Telehealth: Payer: Self-pay | Admitting: Neurology

## 2016-04-25 ENCOUNTER — Encounter: Payer: Self-pay | Admitting: Neurology

## 2016-04-25 ENCOUNTER — Ambulatory Visit (INDEPENDENT_AMBULATORY_CARE_PROVIDER_SITE_OTHER): Payer: BLUE CROSS/BLUE SHIELD | Admitting: Neurology

## 2016-04-25 VITALS — BP 116/72 | HR 83 | Ht 66.0 in | Wt 129.4 lb

## 2016-04-25 DIAGNOSIS — R9389 Abnormal findings on diagnostic imaging of other specified body structures: Secondary | ICD-10-CM

## 2016-04-25 DIAGNOSIS — R938 Abnormal findings on diagnostic imaging of other specified body structures: Secondary | ICD-10-CM | POA: Diagnosis not present

## 2016-04-25 DIAGNOSIS — G3184 Mild cognitive impairment, so stated: Secondary | ICD-10-CM | POA: Diagnosis not present

## 2016-04-25 DIAGNOSIS — R930 Abnormal findings on diagnostic imaging of skull and head, not elsewhere classified: Secondary | ICD-10-CM | POA: Diagnosis not present

## 2016-04-25 LAB — ZINC: Zinc: 74 ug/dL (ref 56–134)

## 2016-04-25 LAB — COPPER, SERUM: Copper: 112 ug/dL (ref 72–166)

## 2016-04-25 NOTE — Telephone Encounter (Signed)
Pt called said her symptoms are getting worse, says she is concerned, she doesn't expect to get worked in sooner than her appt today at 2:00. Pt said she is going to come to clinic and sit in the parking lot and wait, said she wants to be close if something happens. Operator advised her to go ED as this office does not triage. Pt said she does not think ED will be of benefit, said she's had 4 MRI's and was seen at ED 2/11. Operator asked what her symptoms are, she said odd gait, arms feel heavy, head tingling. Again operator advised her to go to ED, she said her boyfriend is on the way to pick her up and they will sit in the parking lot that if an opening becomes available to be seen

## 2016-04-25 NOTE — Progress Notes (Signed)
[facility] [address] [place]. [place] [address] [phone]       OFFICE CONSULT NOTE  Ms. Halford Decamp Date of Birth:  Apr 15, 1965 Medical Record Number:  [id number]   Referring MD:  Raeford Razor Reason for Referral:  Abnormal gait HPI: Ms Tabitha Bennett is a 73 year Caucasian lady accompanied today by her [place]. History is obtained from the patient, fianc and review of ER visit notes. Patient states that about a week ago she saw of physician Dr. Reino Kent from [facility] who started her on multiple herbs. A few days after starting it she feels she may have had a reaction. She complains that her legs are not working. She has trouble walking and stepping. She is able to get up by herself. But she feels her legs and on right. She feels she is off balance. She had no falls or injuries. She also now complains of numbness and tingling in the last few days as well as heaviness in the chest. She has trouble sitting up straight. She has tingling sensation in her head. Her neck hurts. She was seen in the emergency room and 04/22/16 and was found to have brisk hyperreflexia and underwent extensive imaging studies including MRI scan of the brain, cervical, thoracic and lumbar spine which have reviewed personally. MRI scan of the brain shows multiple scattered periventricular and subcortical nonspecific white matter hyperintensities which appear to have progressed compared with previous MRI scan dated 2012. MRI scan also Michael spine shows degenerative changes at C5-6 with bulging disc but no significant compression. MRI scan of the thoracic spine also shows disc degenerative changes at T8-9 and T11-12 but no definite compression. MRI scan the lumbar spine shows spondylitic changes at L3-4 and L4-5 with mild foraminal narrowing but no significant compression again. The patient also had extensive blood work on 04/22/16 including zinc, copper, lithium levels which were normal. Vitamin B12  levels were supranormal. Basic metabolic panel, CBC was normal. Patient was evaluated earlier in September 2012 atthe hospital by Dr. Steele Berg emboli for lower extremity weakness and gait difficulty. At that time MRI scan had shown nonspecific white matter hyperintensities in the brain. She was evaluated for inflammatory, autoimmune and multiple sclerosis and workup was all negative. Lab work included a that time TSH, CPK, LDH, aldolase, ANA, LFTs, HIV all of it was negative. Spinal tap was also done and CSF was benign and oligoclonal bands were negative. MRI of the cervical and thoracic spine was also obtain a that time which was unremarkable. Patient denies any symptoms suggestive of multiple sclerosis in the form of loss of vision, blurred vision, diplopia, vertigo, panda or bowel control issues and fatigue. She does complain of cognitive impairment but trouble with attention, focusing and multitasking. She's not had detailed cognitive testing for that yet.  ROS:   14 system review of systems is positive for  fatigue, memory loss, numbness, weakness, dizziness, depression, change in appetite, disinterest in activities and all other systems negative  PMH:  Past Medical History:  Diagnosis Date  . Bipolar 1 disorder (HCC)     Social History:  Social History   Social History  . Marital status: Married    Spouse name: N/A  . Number of children: N/A  . Years of education: N/A   Occupational History  . Not on file.   Social History Main Topics  . Smoking status: Never Smoker  . Smokeless tobacco: Never Used  . Alcohol use 0.6 oz/week  1 Glasses of wine per week     Comment: 1 glass of wine per week  . Drug use: No  . Sexual activity: Not on file   Other Topics Concern  . Not on file   Social History Narrative  . No narrative on file    Medications:   Current Outpatient Prescriptions on File Prior to Visit  Medication Sig Dispense Refill  . BENFOTIAMINE PO Take 80 mg by mouth  daily.    . citalopram (CELEXA) 20 MG tablet Take 30 mg by mouth daily.  1  . estradiol (ESTRACE) 0.5 MG tablet Take 0.5 mg by mouth daily.  2  . ibuprofen (ADVIL,MOTRIN) 200 MG tablet Take 200 mg by mouth every 6 (six) hours as needed for fever, headache or cramping.    . lamoTRIgine (LAMICTAL) 100 MG tablet Take 300 mg by mouth daily. Take 200mg  by mouth in the morning, and 100mg  by mouth at bedtime  1  . Multiple Vitamin (MULTIVITAMIN WITH MINERALS) TABS tablet Take 1 tablet by mouth daily. "highly absorbable daily multivitamin" by health as it ought to be    . progesterone (PROMETRIUM) 100 MG capsule Take 200 mg by mouth at bedtime.  2   No current facility-administered medications on file prior to visit.     Allergies:  No Known Allergies  Physical Exam General: Frail middle-aged Caucasian lady, seated, in no evident distress Head: head normocephalic and atraumatic.   Neck: supple with no carotid or supraclavicular bruits Cardiovascular: regular rate and rhythm, no murmurs Musculoskeletal: no deformity Skin:  no rash/petichiae Vascular:  Normal pulses all extremities  Neurologic Exam Mental Status: Awake and fully alert. Oriented to place and time. Recent and remote memory intact. Attention span, concentration and fund of knowledge appropriate. Mood and affect appropriate.   Recall is 3/3. Animal naming 13. Clock drawing 4/4. Does not appear depressed Cranial Nerves: Fundoscopic exam reveals sharp disc margins. Pupils equal, briskly reactive to light. Extraocular movements full without nystagmus. Visual fields full to confrontation. Hearing intact. Facial sensation intact. Face, tongue, palate moves normally and symmetrically.  Motor: Normal bulk and tone. Normal strength in all tested extremity muscles. Sensory.: intact to touch , pinprick , position and vibratory sensation.  Coordination: Rapid alternating movements normal in all extremities. Finger-to-nose and heel-to-shin  performed accurately bilaterally. Gait and Station: Arises from chair without difficulty. Stance is normal. Gait is bizarre with nonorganic quality with long measured steps with stiffness but exam shows normal tone.Reflexes: 2+ brisk and symmetric except both ankle jerks are exaggerated. No clonus. Toes downgoing.       ASSESSMENT:  50 year occasional lady with new onset of gait difficulties, paresthesias of unclear etiology. Temporal relationship with starting of herbal medications makes this a possible side effect .  Neurological exam is unremarkable except for mild hyperreflexia. Abnormal MRI scan of the brain showing nonspecific.periventricular and subcortical white matter hyperintensities which appear to have progressed compared to previous scan from 2012. Mild cognitive difficulties of unclear etiology which need further evaluation    PLAN: I had a long discussion with the patient and her fianc about her recent episodes of gait and walking difficulties of sudden onset seizure of unclear etiology and perhaps related to recent starting multiple herbal medications. I also discussed with her the results of MRI scan of the brain showing multiple nonspecific white matter hyperintensities and discussed the differential diagnosis and plan for evaluation. I recommend repeat MRI scan of the brain with contrast  to  look for any enhancing lesions and check lab work for inflammatory conditions. Referred to neuropsychology for detailed cognitive testing for her mild cognitive impairment. I also advised her to see her physician from integrative medicine to discuss her recent symptoms following starting the herbal medications. Also check Transcranial Doppler bubble study to look for patent foramen ovale has similar white matter hyperintensities have been described in patients with PFO. MRI brain reports suggest an enlarged pituitary but my impression it does not appear significantly changed compared to the  previous MRI from 2012 Greater than 50% time during this 45 minute consultation visit was spent on counseling and coordination of care about her gait and multifocal symptoms, abnormal brain MRI scan, plan for evaluation and on some questions Delia HeadyPramod Lashana Spang, MD  [facility] [address] [place] [id number]  Phone [phone] Fax [phone] Note: This document was prepared with digital dictation and possible smart phrase technology. Any transcriptional errors that result from this process are unintentional.

## 2016-04-25 NOTE — Telephone Encounter (Signed)
Rn call patient back. Pt stated she is not in the parking lot but wanted to be close.Pt stated her arms are heavy and she has a weak gait. PT stated she went to the ED on 04/22/2016 and they could not find out why she is having this weakness. Pt stated her boyfriend is close by and she was just nervous. Rn stated her appt is at 0200pm with Dr. Pearlean BrownieSethi. Rn stated If she feels worse or cannot function to call 911 and seek the hospital. Pt verbalized understanding.

## 2016-04-25 NOTE — Telephone Encounter (Signed)
Called to schedule Patient's Doppler / TCD/Bubble . Patient relayed she want's to hold off on Doppler  until MRI results come back.  Dr. Pearlean BrownieSethi are you ok with this please advise Annabelle HarmanDana?

## 2016-04-25 NOTE — Patient Instructions (Addendum)
I had a long discussion with the patient and her fianc about her recent episodes of gait and walking difficulties of sudden onset seizure of unclear etiology and perhaps related to recent starting multiple herbal medications. I also discussed with her the results of MRI scan of the brain showing multiple nonspecific white matter hyperintensities and discussed the differential diagnosis and plan for evaluation. I recommend repeat MRI scan of the brain with contrast  to look for any enhancing lesions and check lab work for inflammatory conditions. Referred to neuropsychology for detailed cognitive testing for her mild cognitive impairment. I also advised her to see her physician from integrative medicine to discuss her recent symptoms following starting the herbal medications. Also check transcranial Doppler bubble study to look for patent foramen ovale has similar white matter hyperintensities have been described in patients with PFO

## 2016-04-26 ENCOUNTER — Ambulatory Visit (INDEPENDENT_AMBULATORY_CARE_PROVIDER_SITE_OTHER): Payer: Self-pay

## 2016-04-26 ENCOUNTER — Other Ambulatory Visit: Payer: BLUE CROSS/BLUE SHIELD

## 2016-04-26 ENCOUNTER — Telehealth: Payer: Self-pay | Admitting: Neurology

## 2016-04-26 ENCOUNTER — Other Ambulatory Visit: Payer: Self-pay

## 2016-04-26 DIAGNOSIS — Z0289 Encounter for other administrative examinations: Secondary | ICD-10-CM

## 2016-04-26 DIAGNOSIS — R930 Abnormal findings on diagnostic imaging of skull and head, not elsewhere classified: Secondary | ICD-10-CM

## 2016-04-26 LAB — ANA: Anti Nuclear Antibody(ANA): NEGATIVE

## 2016-04-26 LAB — B. BURGDORFI ANTIBODIES: Lyme IgG/IgM Ab: <0.91 {ISR} (ref 0.00–0.90)

## 2016-04-26 LAB — HIV ANTIBODY (ROUTINE TESTING W REFLEX): HIV Screen 4th Generation wRfx: NONREACTIVE

## 2016-04-26 LAB — ANGIOTENSIN CONVERTING ENZYME: Angio Convert Enzyme: 24 U/L (ref 14–82)

## 2016-04-26 NOTE — Telephone Encounter (Signed)
New order #[id number] placed for bubble study for pt to have [facility].

## 2016-04-26 NOTE — Telephone Encounter (Signed)
Patient is requesting to have her TCD/ Bubble done at [facility] . [phone].  Order needs to be redone to this order number [id number]  . Thanks Annabelle Harmanana.

## 2016-04-26 NOTE — Telephone Encounter (Signed)
[facility] is [facility] 05/01/2016 arrive at 2:30 for 3:00  apt.  Dr. Pearlean BrownieSethi will be in the hospital that week [facility] will reach him buy pager. Patient is having TCD/ Bubble . Patient is aware of all details.

## 2016-04-26 NOTE — Telephone Encounter (Signed)
Dr. Pearlean BrownieSethi Patient has decided to she is going to have TCD/ Bubble. Patient called this am. Thanks Annabelle Harmanana.

## 2016-04-26 NOTE — Telephone Encounter (Signed)
Ok

## 2016-05-01 ENCOUNTER — Ambulatory Visit (HOSPITAL_COMMUNITY)
Admission: RE | Admit: 2016-05-01 | Discharge: 2016-05-01 | Disposition: A | Discharge status: Home / Self Care | Payer: BLUE CROSS/BLUE SHIELD | Source: Ambulatory Visit | Attending: Neurology | Admitting: Neurology

## 2016-05-01 ENCOUNTER — Ambulatory Visit (HOSPITAL_COMMUNITY): Payer: BLUE CROSS/BLUE SHIELD

## 2016-05-01 DIAGNOSIS — R930 Abnormal findings on diagnostic imaging of skull and head, not elsewhere classified: Secondary | ICD-10-CM | POA: Insufficient documentation

## 2016-05-01 NOTE — Progress Notes (Signed)
*  PRELIMINARY RESULTS* Vascular Ultrasound Transcranial Doppler with Bubbles has been completed with Dr. Pearlean BrownieSethi. High Intensity Transient Signals (HITS) were heard at rest and with valsalva maneuver, suggestive of a medium to large PFO.  05/01/2016 4:40 PM Tabitha Bennett, BS, RVT, RDCS, RDMS

## 2016-05-03 ENCOUNTER — Emergency Department (HOSPITAL_COMMUNITY): Payer: BLUE CROSS/BLUE SHIELD

## 2016-05-03 ENCOUNTER — Encounter (HOSPITAL_COMMUNITY): Payer: Self-pay

## 2016-05-03 ENCOUNTER — Emergency Department (HOSPITAL_COMMUNITY)
Admission: EM | Admit: 2016-05-03 | Discharge: 2016-05-03 | Discharge status: Against Medical Advice | Payer: BLUE CROSS/BLUE SHIELD | Attending: Emergency Medicine | Admitting: Emergency Medicine

## 2016-05-03 DIAGNOSIS — R079 Chest pain, unspecified: Secondary | ICD-10-CM

## 2016-05-03 DIAGNOSIS — Z79899 Other long term (current) drug therapy: Secondary | ICD-10-CM | POA: Insufficient documentation

## 2016-05-03 DIAGNOSIS — R0789 Other chest pain: Secondary | ICD-10-CM | POA: Diagnosis not present

## 2016-05-03 LAB — CBC
HCT: 39.5 % (ref 36.0–46.0)
Hemoglobin: 12.8 g/dL (ref 12.0–15.0)
MCH: 31.3 pg (ref 26.0–34.0)
MCHC: 32.4 g/dL (ref 30.0–36.0)
MCV: 96.6 fL (ref 78.0–100.0)
Platelets: 279 10*3/uL (ref 150–400)
RBC: 4.09 MIL/uL (ref 3.87–5.11)
RDW: 12.3 % (ref 11.5–15.5)
WBC: 6.6 10*3/uL (ref 4.0–10.5)

## 2016-05-03 LAB — BASIC METABOLIC PANEL
Anion gap: 8 (ref 5–15)
BUN: 13 mg/dL (ref 6–20)
CO2: 26 mmol/L (ref 22–32)
Calcium: 9.2 mg/dL (ref 8.9–10.3)
Chloride: 104 mmol/L (ref 101–111)
Creatinine, Ser: 1.02 mg/dL — ABNORMAL HIGH (ref 0.44–1.00)
GFR calc Af Amer: >60 mL/min (ref >60)
GFR calc non Af Amer: >60 mL/min (ref >60)
Glucose, Bld: 100 mg/dL — ABNORMAL HIGH (ref 65–99)
Potassium: 3.8 mmol/L (ref 3.5–5.1)
Sodium: 138 mmol/L (ref 135–145)

## 2016-05-03 LAB — I-STAT TROPONIN, ED
Troponin i, poc: 0 ng/mL (ref 0.00–0.08)
Troponin i, poc: 0.01 ng/mL (ref 0.00–0.08)

## 2016-05-03 NOTE — ED Notes (Addendum)
Pt states she has a prickling feeling in her chest. Pt denies nausea, vomiting, diaphoresis. Pt states she started to have weakness. 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 Pt currently not in acute distress. Vitals stable.

## 2016-05-03 NOTE — ED Provider Notes (Signed)
[facility]-EMERGENCY DEPT Provider Note   CSN: [id number] Arrival date & time: 05/03/16  1521     History   Chief Complaint Chief Complaint  Patient presents with  . Chest Pain    HPI Tabitha Bennett is a 51 y.o. female presenting with increased heart rate and burning chest with a prickly feeling in her left upper chest and doesn't feel like she is walking as well. She states that she was sitting down at onset and felt weak and dizzy and her chest burning. She currently feels lightheaded and chest tightness. She denies shortness of breath, nausea, vomiting, leg swelling. She expires that 2 weeks ago she has had an episode of trouble walking shaky legs and weakness and had a full workup done where they did an MRI she was doing fine and back to normal 2 days ago before she had this episode today.  HPI  Past Medical History:  Diagnosis Date  . Bipolar 1 disorder [facility])     Patient Active Problem List   Diagnosis Date Noted  . Abnormal MRI 04/25/2016    History reviewed. No pertinent surgical history.  OB History    No data available       Home Medications    Prior to Admission medications   Medication Sig Start Date End Date Taking? Authorizing Provider  citalopram (CELEXA) 20 MG tablet Take 30 mg by mouth daily. 02/13/16  Yes Historical Provider, MD  estradiol (ESTRACE) 0.5 MG tablet Take 0.5 mg by mouth at bedtime.  04/20/16  Yes Historical Provider, MD  lamoTRIgine (LAMICTAL) 100 MG tablet Take 100-200 mg by mouth See admin instructions. Take 200mg  by mouth in the morning, and 100mg  by mouth at bedtime 03/19/16  Yes Historical Provider, MD  lisdexamfetamine (VYVANSE) 30 MG capsule Take 30 mg by mouth daily.   Yes Historical Provider, MD  progesterone (PROMETRIUM) 200 MG capsule Take 200 mg by mouth at bedtime.   Yes Historical Provider, MD    Family History No family history on file.  Social History Social History  Substance Use Topics  . Smoking status: Never Smoker  .  Smokeless tobacco: Never Used  . Alcohol use 0.6 oz/week    1 Glasses of wine per week     Comment: 1 glass of wine per week     Allergies   Patient has no known allergies.   Review of Systems Review of Systems  Constitutional: Negative for chills and fever.  HENT: Positive for ear pain. Negative for congestion and sore throat.   Eyes: Negative for pain and visual disturbance.  Respiratory: Positive for chest tightness. Negative for cough, shortness of breath and wheezing.   Cardiovascular: Negative for chest pain and palpitations.  Gastrointestinal: Negative for abdominal distention, abdominal pain, blood in stool, diarrhea, nausea and vomiting.  Genitourinary: Negative for difficulty urinating, dysuria and hematuria.  Musculoskeletal: Positive for neck pain. Negative for arthralgias, back pain and neck stiffness.       Reports months of left-sided neck pain which she thinks the blood vessel.  Skin: Negative for color change and rash.  Neurological: Positive for weakness and light-headedness. Negative for seizures, syncope, numbness and headaches.  All other systems reviewed and are negative.    Physical Exam Updated Vital Signs BP 116/76   Pulse 76   Temp 99.8 F (37.7 C) (Oral)   Resp 18   SpO2 100%   Physical Exam  Constitutional: She appears well-developed and well-nourished. No distress.  Afebrile, nontoxic-appearing, lying comfortably  in bed in no acute distress  HENT:  Head: Normocephalic and atraumatic.  Eyes: Conjunctivae are normal. Pupils are equal, round, and reactive to light.  Neck: Normal range of motion. Neck supple.  Cardiovascular: Normal rate, regular rhythm, normal heart sounds and intact distal pulses.   No murmur heard. Pulmonary/Chest: Effort normal and breath sounds normal. No respiratory distress. She has no wheezes. She has no rales. She exhibits no tenderness.  Abdominal: Soft. She exhibits no distension. There is no tenderness. There is no  guarding.  Musculoskeletal: Normal range of motion. She exhibits no edema.  Neurological: She is alert.  Skin: Skin is warm and dry. She is not diaphoretic. No pallor.  Psychiatric: She has a normal mood and affect.  Nursing note and vitals reviewed.    ED Treatments / Results  Labs (all labs ordered are listed, but only abnormal results are displayed) Labs Reviewed  BASIC METABOLIC PANEL - Abnormal; Notable for the following:       Result Value   Glucose, Bld 100 (*)    Creatinine, Ser 1.02 (*)    All other components within normal limits  CBC  I-STAT TROPOININ, ED  Rosezena SensorI-STAT TROPOININ, ED    EKG  EKG Interpretation None       Radiology Dg Chest 2 View  Result Date: 05/03/2016 CLINICAL DATA:  51 y/o  F; chest pain. EXAM: CHEST  2 VIEW COMPARISON:  03/17/2015 chest radiograph. FINDINGS: Stable heart size and mediastinal contours are within normal limits. Both lungs are clear. The visualized skeletal structures are unremarkable. IMPRESSION: No active cardiopulmonary disease. Electronically Signed   By: Mitzi HansenLance  Furusawa-Stratton M.D.   On: 05/03/2016 15:55    Procedures Procedures (including critical care time)  Medications Ordered in ED Medications - No data to display   Initial Impression / Assessment and Plan / ED Course  I have reviewed the triage vital signs and the nursing notes.  Pertinent labs & imaging results that were available during my care of the patient were reviewed by me and considered in my medical decision making (see chart for details).     Labs unremarkable, chest x-ray without acute cardiopulmonary process, EKG normal sinus rhythm Reassuring exam, stable vitals. Patient left AMA before workup was fully completed.  Most of the results were already discussed with patient. I had updated her, explaining that her initial troponin was negative, chest x-ray was negative and EKG normal . She was well-appearing in no acute distress, eating dinner sitting  comfortably in bed   Final Clinical Impressions(s) / ED Diagnoses   Final diagnoses:  Nonspecific chest pain    New Prescriptions Discharge Medication List as of 05/03/2016  8:59 PM       Georgiana ShoreJessica B Wanona Stare, PA-C 05/04/16 0216    Azalia BilisKevin Campos, MD 05/04/16 [id number]

## 2016-05-03 NOTE — ED Notes (Signed)
Pt's SO came to nurse's station, asking if they could leave now. I encouraged them to wait for their provider to speak with the pt and provide d/c instructions and f/u recommendations. Spoke with pt about same, but also stated it was in her right to depart AMA, understanding that she accepted the risks of such conditions. Pt agreed to acceptance of risks, and signed out AMA. Pt departed in NAD, refusing use of wheelchair.

## 2016-05-03 NOTE — ED Notes (Signed)
ED Provider at bedside. 

## 2016-05-03 NOTE — ED Notes (Signed)
Pt states she is pain free and "ready to go".

## 2016-05-03 NOTE — ED Triage Notes (Signed)
Pt reports chest heaviness and burning that started today associated with dizziness, lack of appetite and "it feels like my heart is beating fast."

## 2016-05-04 ENCOUNTER — Encounter: Payer: Self-pay | Admitting: Neurology

## 2016-05-07 ENCOUNTER — Encounter: Payer: Self-pay | Admitting: Neurology

## 2016-05-07 DIAGNOSIS — R930 Abnormal findings on diagnostic imaging of skull and head, not elsewhere classified: Secondary | ICD-10-CM | POA: Diagnosis not present

## 2016-05-08 ENCOUNTER — Ambulatory Visit (INDEPENDENT_AMBULATORY_CARE_PROVIDER_SITE_OTHER): Payer: Self-pay

## 2016-05-08 ENCOUNTER — Encounter: Payer: Self-pay | Admitting: Neurology

## 2016-05-08 DIAGNOSIS — R930 Abnormal findings on diagnostic imaging of skull and head, not elsewhere classified: Secondary | ICD-10-CM

## 2016-05-08 DIAGNOSIS — Z0289 Encounter for other administrative examinations: Secondary | ICD-10-CM

## 2016-05-09 ENCOUNTER — Other Ambulatory Visit: Payer: BLUE CROSS/BLUE SHIELD

## 2016-05-10 ENCOUNTER — Telehealth: Payer: Self-pay | Admitting: Neurology

## 2016-05-10 NOTE — Telephone Encounter (Signed)
Pt called request MRI results. Pt says she had MRI on Monday, I said Tuesday she said no Monday. I advised her it can take 4-5 days to get the results as the provider has to read the images. Pt was advised RN or provider will call once the image is read. Pt understood.

## 2016-05-10 NOTE — Telephone Encounter (Signed)
Rn call patient to let her know the MRI brain with contrast was normal. Rn also stated to patient Dr.Sethi will call her back to discuss all her test and discuss the plan for her care. PT verbalized understanding.

## 2016-05-10 NOTE — Telephone Encounter (Signed)
I returned patient's call and informed her of the results of MRI scan of the brain with contrast being normal enzyme do not believe the white matter hyperintensities on brain MRI scan from multiple sclerosis. I had also done a transcranial Doppler   bubble study which was positive indicative of a patent foramen ovale but I do not believe that this is responsible for the patient's current symptoms and do not believe PFO closure is indicated at the present time. She voiced understanding. I recommend she keep her appointment for neuropsych testing on March 29. I personally spoke to the neuropsychologist to see if the appointment could be moved earlier and she said she would put the patient on a cancellation list. The patient appears frustrated due to lack of definite diagnosis and her persistent ongoing symptoms. I offered a second opinion in town as well as a referral to a tertiary care center but the patient wants to wait.

## 2016-05-10 NOTE — Telephone Encounter (Signed)
Rn spoke with Dr.Sethi and the patients MRI was normal. PEr Dr. Pearlean BrownieSethi he will call the patient back to discuss all the test and overall plan.

## 2016-05-15 ENCOUNTER — Other Ambulatory Visit: Payer: Self-pay | Admitting: Neurology

## 2016-05-15 ENCOUNTER — Telehealth: Payer: Self-pay | Admitting: Neurology

## 2016-05-15 DIAGNOSIS — R42 Dizziness and giddiness: Secondary | ICD-10-CM

## 2016-05-15 NOTE — Telephone Encounter (Signed)
Ok will do so 

## 2016-05-15 NOTE — Telephone Encounter (Signed)
Referral put in for patient to see per her request Dr. Cristopher PeruHemang Shah for second opinion.

## 2016-05-15 NOTE — Telephone Encounter (Signed)
Patient called and she would like a referral to see Dr. Verdie MosherHernang Shah  For 2nd Opinion  He is affiliated  with [facility] . Telephone [phone] - fax [phone].   I relayed to Patient that Dr. Pearlean BrownieSethi was out of the office until next week. Patient understood details.

## 2016-05-16 NOTE — Telephone Encounter (Signed)
Noted Referral has been sent. Thanks Dana. °

## 2016-06-07 ENCOUNTER — Ambulatory Visit (INDEPENDENT_AMBULATORY_CARE_PROVIDER_SITE_OTHER): Payer: BLUE CROSS/BLUE SHIELD | Admitting: Psychology

## 2016-06-07 DIAGNOSIS — F319 Bipolar disorder, unspecified: Secondary | ICD-10-CM

## 2016-06-07 DIAGNOSIS — R413 Other amnesia: Secondary | ICD-10-CM

## 2016-06-07 NOTE — Progress Notes (Signed)
NEUROPSYCHOLOGICAL INTERVIEW (CPT: [id number])  Name: Tabitha Bennett Date of Birth: [birth year] Date of Interview: 06/07/2016  Reason for Referral:  Tabitha Bennett is a 51 y.o. female who is referred for neuropsychological evaluation by Dr. Pearlean Brownie of [facility] due to cognitive concerns. This patient is unaccompanied in the office for today's visit.  History of Presenting Problem:  Tabitha Bennett reported a change in cognitive functioning which she first noticed in January 2017. There were no medical issues or situational factors that she can identifying correlating with onset of cognitive changes. She reported that cognitive functioning has continued to worsen since then. Cognitive symptoms are present every day and interfere with her ability to function as she used to. She reported she makes frequent errors and has attention lapses even when completing everyday tasks like making coffee. She stated she will get everything out and then put away an item before she even uses it. This is very frustrating to her. The other day she turned on her hazard lights when driving and when she went to turn them off she could not remember where they were. She reported she had to change the home screen on her iphone so that it has only 2 apps on it because she was too confused and overwhelmed by the page full of apps and would not know what to do. She reported she is less organized and it takes her a long time to complete tasks which used to be quick and easy for her. She reported she misplaces items all the time, has difficulty concentrating, starts but does not finish tasks and processing information more slowly. She endorsed word finding difficulty. She denied forgetting recent conversations or events or difficulty with navigating when driving. She denied comprehension difficulty in conversation. 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 She reported that as a result of her cognitive symptoms she no longer wants to be around people, because she feels stupid and finds it stressful. She continues to live alone and manage all instrumental ADLs. She denied any problems with driving, managing her medications and managing her appointments. She is finding complex financial paperwork more difficult; she has procrastinated on completing her taxes this year because she knows it will be challenging and she may even hire someone to do it for her. She does her own cooking but has difficulty with attention/concentration and organization when doing so.   Tabitha Bennett was seen in the ED on 04/22/2016 for gait abnormality. She was having difficulty walking and felt as though her legs were rigid. She had seen an integrative medicine specialist two days prior and started multiple supplements. On exam, she was noted to have hyperreflexia in bilateral lower extremities. Neuroimaging did not find any cause for symptoms and bloodwork was normal. Her gait improved and she was discharged home. She was advised to stop all supplements. She followed up with outpatient neurology (Dr. Pearlean Brownie at [facility]) on 04/25/2016. She also complained of paresthesias as that time. Neurological exam was unremarkable aside except for mild hyperreflexia. Etiology of her symptoms remained unclear. She completed MRI of the brain with contrast on 04/28/2016; there were no abnormal enhancing lesions noted.   MRI of the brain without contrast during her ED visit on 04/22/2016 did not reveal any acute intracranial abnormality. There were foci of white matter T2 FLAIR hyperintensity probably representing mild chronic  microvascular ischemic changes, mildly progressed from 2012. Also noted was enlarged pituitary gland (relatively stable from 2012).   The patient also has a history of gait difficulty on one occasion in 2012 but she reports this was less  severe. She was hospitalized 11/24/2010-11/25/2016 and underwent neuroimaging and lumbar puncture. No etiology for symptoms was found and they eventually resolved.  Tabitha Bennett reported that she continues to have gait difficulty at this time but it comes and goes. She showed me a video on her phone of a night she was having difficulty walking. Spasticity and tremors in upper extremities were observed. She is going to see a [facility] neurologist for a second opinion of her gait difficulties. The last time she had symptoms was four days prior to her clinical interview with me. She reported that she was walking as slow as a snail in the morning but later on she was walking normally. She has not had any falls. She does feel that her walking is overall less steady over the past couple of weeks, even when she is not having other symptoms.  Family neurologic history is significant for polymyositis in her grandmother and Alzheimer's disease in her mother (mother passed away in June 2017 but showed significant signs at age 52 and in fact had to stop working at that age).   With regard to psychiatric history, the patient has a history of bipolar disorder, diagnosed around age 51. She reported a history of hypomania. She feels she has been stable for a while now. Her symptoms are managed with Lamictal and Celexa. She has a history of one psychiatric hospitalization and suicide attempt at age 51. Family history is significant for bipolar disorder in both her mother and sister. [name] has seen psychiatrists and therapists in the past but is not currently. Her medications are now prescribed by her PCP. She also has taken Vyvanse for 15 years. She reported that this was prescribed more for depression than ADHD, but both of her sons have ADHD. She noted that she recently lowered the dose of her Vyvanse and found that she felt less confused but a bit more depressed.   At the present time, she reports depression and anxiety  related to current stressors (neurologic symptoms, cognitive decline, and wedding planning). She denied any significant sleep difficulty. Her appetite is "voracious" currently but she reported that when she started having the walking difficulties earlier this year she completely lost her appetite. She denied current suicidal ideation or intention. She denied hallucinations.  History of substance abuse or dependence was denied.    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 After she had problem the problems with her legs earlier this year, she was out for 2 weeks. She agreed to be listed as a substitute for the rest of semester rather than coming back full time.  Marital history: She was married for 28 years and got divorced 3-4 years ago. She is currently engaged. She has two sons from her first marriage. Alcohol/Tobacco/Substances: She reported she drinks one glass of wine 2-3 times a week. She has never been a smoker or tobacco user. She denied recreational drug use.    Medical History: Past Medical History:  Diagnosis Date  . Bipolar 1 disorder (HCC)      Current Medications:  Outpatient Encounter Prescriptions as of 06/07/2016  Medication Sig  . citalopram (CELEXA) 20 MG tablet Take 30 mg by mouth daily.  Marland Kitchen. estradiol (ESTRACE) 0.5 MG tablet  Take 0.5 mg by mouth at bedtime.   . lamoTRIgine (LAMICTAL) 100 MG tablet Take 100-200 mg by mouth See admin instructions. Take 200mg  by mouth in the morning, and 100mg  by mouth at bedtime  . lisdexamfetamine (VYVANSE) 30 MG capsule Take 30 mg by mouth daily.  . progesterone (PROMETRIUM) 200 MG capsule Take 200 mg by mouth at bedtime.   No facility-administered encounter medications on file as of 06/07/2016.      Behavioral Observations:   Appearance: Neatly and appropriately dressed and groomed Gait: Ambulated independently, no gross abnormalities observed Speech: Fluent; normal rate, rhythm and  volume Thought process: Generally linear Affect: Full, mildly anxious, dysthymic Interpersonal: Pleasant, appropriate   TESTING: There is medical necessity to proceed with neuropsychological assessment as the results will be used to aid in differential diagnosis and clinical decision-making and to inform specific treatment recommendations. Per the patient and medical records reviewed, there has been a change in cognitive functioning and a reasonable suspicion of neurocognitive disorder. There is a need for objective assessment of her subjective complaints in order to determine neurologic vs psychiatric etiology.   PLAN: The patient will return for a full battery of neuropsychological testing with a psychometrician under my supervision. Education regarding testing procedures was provided. Subsequently, the patient will see this provider for a follow-up session at which time her test performances and my impressions and treatment recommendations will be reviewed in detail.   Full neuropsychological evaluation report to follow.

## 2016-06-08 ENCOUNTER — Encounter: Payer: Self-pay | Admitting: Psychology

## 2016-06-11 ENCOUNTER — Ambulatory Visit (INDEPENDENT_AMBULATORY_CARE_PROVIDER_SITE_OTHER): Payer: BLUE CROSS/BLUE SHIELD | Admitting: Psychology

## 2016-06-11 DIAGNOSIS — R413 Other amnesia: Secondary | ICD-10-CM

## 2016-06-12 NOTE — Progress Notes (Signed)
   Neuropsychology Note  Tabitha Bennett returned today for 3 hours of neuropsychological testing with technician, Wallace Keller, BS, under the supervision of Dr. Elvis Coil. The patient did not appear overtly distressed by the testing session, per behavioral observation or via self-report to the technician. Rest breaks were offered. Tabitha Bennett will return within 2 weeks for a feedback session with Dr. Alinda Dooms at which time her test performances, clinical impressions and treatment recommendations will be reviewed in detail. The patient understands she can contact our office should she require our assistance before this time.  Full report to follow.

## 2016-06-20 NOTE — Progress Notes (Signed)
NEUROPSYCHOLOGICAL EVALUATION   Name:    Tabitha Bennett  Date of Birth:   01/01/1966 Date of Interview:  06/07/2016 Date of Testing:  06/11/2016   Date of Feedback:  06/21/2016       Background Information:  Reason for Referral:  Tabitha Bennett is a 51 y.o. female referred by Dr. Leonie Man of [facility] to assess her current level of cognitive functioning and assist in differential diagnosis. The current evaluation consisted of a review of available medical records, an interview with the patient, and the completion of a neuropsychological testing battery. Informed consent was obtained.  The patient requested that this report not be sent directly to Dr. Leonie Man. She reported she has transferred her care to the [facility]. She requested that I provide a summary of this report to her new neurologist, Dr. Brigitte Pulse.  History of Presenting Problem:  Tabitha Bennett reported a change in cognitive functioning which she first noticed in January 2017. There were no medical issues or situational factors that she can identifying correlating with onset of cognitive changes. She reported that cognitive functioning has continued to worsen since then. Cognitive symptoms are present every day and interfere with her ability to function as she used to. She reported she makes frequent errors and has attention lapses even when completing everyday tasks like making coffee. She stated she will get everything out and then put away an item before she even uses it. This is very frustrating to her. The other day she turned on her hazard lights when driving and when she went to turn them off she could not remember where they were. She reported she had to change the home screen on her iphone so that it has only 2 apps on it because she was too confused and overwhelmed by the page full of apps and would not know what to do. She reported she is less organized and it takes her a long time to complete tasks  which used to be quick and easy for her. She reported she misplaces items all the time, has difficulty concentrating, starts but does not finish tasks and is processing information more slowly. She endorsed word finding difficulty. She denied forgetting recent conversations or events or difficulty with navigating when driving. She denied comprehension difficulty in conversation. 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 She reported that as a result of her cognitive symptoms she no longer wants to be around people, because she feels stupid and finds it stressful. She continues to live alone and manage all instrumental ADLs. She denied any problems with driving, managing her medications and managing her appointments. She is finding complex financial paperwork more difficult; she has procrastinated on completing her taxes this year because she knows it will be challenging and she may even hire someone to do it for her. She does her own cooking but has difficulty with attention/concentration and organization when doing so.   Tabitha Bennett was seen in the ED on 04/22/2016 for gait abnormality. She was having difficulty walking and felt as though her legs were rigid. She had seen an integrative medicine specialist two days prior and started multiple supplements. On exam, she was noted to have hyperreflexia in bilateral lower extremities. Neuroimaging did not find any cause for symptoms and bloodwork was normal. Her gait improved and she was discharged home. She  was advised to stop all supplements. She followed up with outpatient neurology (Dr. Leonie Man at [facility]) on 04/25/2016. She also complained of paresthesias as that time. Neurological exam was unremarkable aside except for mild hyperreflexia. Etiology of her symptoms remained unclear. She completed MRI of the brain with contrast on 04/28/2016; there were no  abnormal enhancing lesions noted.   MRI of the brain without contrast during her ED visit on 04/22/2016 did not reveal any acute intracranial abnormality. There were foci of white matter T2 FLAIR hyperintensity probably representing mild chronic microvascular ischemic changes, mildly progressed from 2012. Also noted was enlarged pituitary gland (relatively stable from 2012).   The patient also has a history of gait difficulty on one occasion in 2012 but she reports this was less severe. She was hospitalized 11/24/2010-11/26/2010 and underwent neuroimaging and lumbar puncture. No etiology for symptoms was found and they eventually resolved.  Tabitha Bennett reported that she continues to have gait difficulty but it comes and goes. She showed me a video on her phone of a night she was having difficulty walking. Spasticity was observed as were tremors in the upper extremities. She is going to see a [facility] neurologist for a second opinion of her gait difficulties. The last time she had symptoms was four days prior to her clinical interview with me. She reported that she was walking "as slow as a snail" in the morning but later on she was walking normally. She has not had any falls. She does feel that her walking is overall less steady over the past couple of weeks, even when she is not having other symptoms.  Family neurologic history is significant for polymyositis in her grandmother and Alzheimer's disease in her mother (mother passed away in Sep 11, 2015 but showed significant signs at age 71 and in fact had to stop working at that age).   With regard to psychiatric history, the patient has a history of bipolar disorder, diagnosed around age 61. She reported a history of hypomania, with most recent hypomanic episode in October 2017. Her symptoms are managed with Lamictal and Celexa. She has a history of one psychiatric hospitalization and suicide attempt at age 72. Family history is significant for bipolar disorder  in both her mother and sister. Tabitha Bennett has seen psychiatrists and therapists in the past but is not currently. Her medications are now prescribed by her PCP. She also has taken Vyvanse for 15 years. She reported that this was prescribed more for depression than ADHD, but both of her sons have ADHD. She noted that she recently started taking only half the dose of her prescribed Vyvanse and found that she felt less confused but a bit more depressed.   At the present time, she reports depression and anxiety related to current stressors (neurologic symptoms, cognitive decline, and wedding planning). She denied any significant sleep difficulty. Her appetite is "voracious" currently but she reported that when she started having the walking difficulties earlier this year she completely lost her appetite. She denied current suicidal ideation or intention. She denied hallucinations.  History of substance abuse or dependence was denied.   Social History: Educational history: Attended college as an adult (age 41-41). College graduate (16 years). Occupational history: Was working as an Higher education careers adviser at [employer]. After she had problem the problems with her legs earlier this year, she was out for 2 weeks. She agreed to be listed as a substitute for the rest of semester rather than coming back full time.  Marital history:  She was married for 28 years and got divorced 3-4 years ago. She is currently engaged. She has two sons from her first marriage. Alcohol/Tobacco/Substances: She reported she drinks one glass of wine 2-3 times a week. She has never been a smoker or tobacco user. She denied recreational drug use.   Medical History:  Past Medical History:  Diagnosis Date  . Bipolar 1 disorder ([place])     Current medications:  Outpatient Encounter Prescriptions as of 06/21/2016  Medication Sig  . citalopram (CELEXA) 20 MG tablet Take 30 mg by mouth daily.  Marland Kitchen estradiol (ESTRACE) 0.5 MG tablet Take 0.5 mg by mouth  at bedtime.   . lamoTRIgine (LAMICTAL) 100 MG tablet Take 100-200 mg by mouth See admin instructions. Take 275m by mouth in the morning, and 1023mby mouth at bedtime  . lisdexamfetamine (VYVANSE) 30 MG capsule Take 30 mg by mouth daily.  . progesterone (PROMETRIUM) 200 MG capsule Take 200 mg by mouth at bedtime.   No facility-administered encounter medications on file as of 06/21/2016.     Current Examination:  Behavioral Observations:   Appearance: Neatly and appropriately dressed and groomed Gait: Ambulated independently, no gross abnormalities observed Speech: Fluent; normal rate, rhythm and volume Thought process: Generally linear Affect: Full, mildly anxious, dysthymic Interpersonal: Pleasant, appropriate Orientation: Oriented to all spheres. Accurately named the current President and his predecessor.  Tests Administered: . Test of Premorbid Functioning (TOPF) . Wechsler Adult Intelligence Scale-Fourth Edition (WAIS-IV): Similarities, Block Design, Matrix Reasoning, Arithmetic, Symbol Search, Coding and Digit Span subtests . Wechsler Memory Scale-Fourth Edition (WMS-IV) Adult Version (ages 1653-69 Logical Memory I, II and Recognition subtests  . CaWisconsinerbal Learning Test - 2nd Edition (CVLT-2) Standard Form . Conners Continuous Performance Test 3rd Edition (CPT3) . WiLandAmerica FinancialWCST) . Repeatable Battery for the Assessment of Neuropsychological Status (RBANS) Form A:  Figure Copy and Figure Recall subtests . Neuropsychological Assessment Battery (NAB) Language Module, Form 1:  Naming and Bill Payment subtests . Controlled Oral Word Association Test (COWAT) . Trail Making Test A and B . Boston Diagnostic Aphasia Examination (BDAE): Complex Ideational Material Subtest . Personality Assessment Inventory (PAI)  Test Results: Note: Standardized scores are presented only for use by appropriately trained professionals and to allow for any future test-retest  comparison. These scores should not be interpreted without consideration of all the information that is contained in the rest of the report. The most recent standardization samples from the test publisher or other sources were used whenever possible to derive standard scores; scores were corrected for age, gender, ethnicity and education when available.   Test Scores:  Test Name Raw Score Standardized Score Descriptor  TOPF 59/70 SS= 116 High average  WAIS-IV Subtests     Similarities 27/36 ss= 10 Average  Block Design 30/66 ss= 8 Average  Matrix Reasoning 14/26 ss= 8 Average  Arithmetic 14/22 ss= 10 Average  Symbol Search 31/60 ss= 10 Average  Coding 69/135 ss= 11 Average  Digit Span 27/48 ss= 10 Average  WAIS-IV Index Scores     Working Memory  SS= 100 Average  Processing Speed  SS= 102 Average  WMS-IV Subtests     LM I 16/50 ss= 6 Low average  LM II 12/50 ss= 6 Low average  LM II Recognition 21/30 Cum %: 10-16 Below average  CVLT-II Scores     Trial 1 3/16 Z= -2 Impaired  Trial 5 15/16 Z= 1 High average  Trials 1-5 total 44/80 T= 44 Average  SD Free Recall 13/16 Z= 0.5 Average  SD Cued Recall 15/16 Z= 1 High average  LD Free Recall 14/16 Z= 1 High average  LD Cued Recall 14/16 Z= 0.5 Average  Recognition Discriminability 16/16 hits, 1 false positive Z= 1 High average  Forced Choice Recognition 16/16  WNL  CPT3     Detectability  T= 68 Elevated  Omissions  T= 52 Average  Commissions  T= 73 Very elevated  Perseverations  T= 59 High average  HRT SD  T= 86 Very elevated  HRT ISI Change  T= 90 Very elevated  WCST     Total Errors 10 T= 55 Average  Perseverative Responses 5 T= 55 Average  Perseverative Errors 5 T= 49 Average  Conceptual Level Responses 52 T= 52 Average  Categories Completed 4 >16%ile WNL  Trials to Complete 1st Category 18 11-16%ile   Failure to Maintain Set 1    RBANS Subtests     Figure Copy 19/20 Z= 0.6 Average  Figure Recall 15/20 Z= 0.5 Average  NAB  Language subtests     Naming 30/31 T= 52 Average  Bill Payment 17/19  Below expectation  COWAT-FAS 60 T= 64 Superior  COWAT-Animals 22 T= 50 Average  Trail Making Test A  32" 0 errors T= 53 Average  Trail Making Test B 0 errors 86" 1 error T= 47 Average  BDAE Complex Ideational Material 11/12  Below expectation  PAI  Only elevated clinical scales are shown here:   DEP  T= 70      Description of Test Results:  Embedded performance validity indicators were within normal limits. As such, the patient's current performance on neurocognitive testing is judged to be a relatively accurate representation of her current level of neurocognitive functioning.   Premorbid verbal intellectual abilities were estimated to have been within the high average range based on a test of word reading. Psychomotor processing speed was average. Focused auditory attention and working memory were average. On a test of sustained visual attention, she had a total of 4 abnormal/impaired scores which is associated with a high likelihood of having a disorder characterized by attention deficits. Relative to the normative sample, she was less able to differentiate targets from non-targets, made more commission errors, displayed less consistency in response speed and displayed more of a reduction in response speed at longer inter-stimulus intervals. There were indications of inattentiveness, impulsivity and vigilance problems on this task. Visual-spatial construction was average. Language abilities were somewhat variable. Specifically, confrontation naming and semantic verbal fluency were average, while her auditory comprehension of complex ideational material (requiring attention and inference) was slightly below expectation. Her performance on a simulated bill payment task (requiring multiple aspects of expressive and receptive language) was also below expectation. With regard to verbal memory, encoding and acquisition of  non-contextual information (i.e., word list) was impaired on the first learning trial but improved significantly over the course of the five learning trials to the high average range. This pattern of encoding is reflective of attention issues and a tendency to be overwhelmed by incoming information, but significant improvement with repetition of to-be-learned information. After an interference task, free recall was average (13/16 items recalled). With semantic cueing, she recalled an additional two words (high average). After a delay, free recall was high average (14/16 items recalled). Performance on a yes/no recognition task was high average. On another verbal memory test, encoding and acquisition of contextual auditory information (i.e., short stories) was low average and below expectation. After a  delay, free recall was low average and below expectation. Performance on a yes/no recognition task was below average. With regard to non-verbal memory, delayed free recall of visual information was average. Performance on measures of various aspects of executive functioning was intact overall. Mental flexibility and set-shifting were average on Trails B. Verbal fluency with phonemic search restrictions was superior. Verbal abstract reasoning was average. Non-verbal abstract reasoning was average. Deductive reasoning and problem solving were average.   On an extensive measure of psychopathology and personality (PAI), the patient produced a valid profile, suggesting that the patient answered in a reasonably forthright manner and did not attempt to present an unrealistic or inaccurate impression that was either more negative or more positive than the clinical picture would warrant. Her PAI clinical profile is marked by a significant elevation on the Depression scale. The patient reports a number of difficulties consistent with a significant depressive experience.  The quality of the patient's depression seems primarily  marked by cognitive features such as negative expectancies and low self-esteem.  She is likely to be quite pessimistic and plagued by thoughts of worthlessness, hopelessness, and personal failure.  Experienced sadness and physiological disturbances, however, appear to play only a minimal to moderate role in the clinical picture. The patient describes her thought processes as marked by confusion, distractibility, and difficulty concentrating.  She may also have problems communicating clearly with other people because of speech that may tend to be tangential or circumstantial. The patient is reporting that she experiences a great deal of tension, has difficulty in relaxing, and likely encounters fatigue as a result of high perceived stress. The patient indicates some concerns about physical functioning and health matters in general.  According to the patient's self-report, she describes NO significant problems in the following areas: antisocial behavior; problems with empathy; undue suspiciousness or hostility; extreme moodiness and impulsivity; unusually elevated mood or heightened activity; problematic behaviors used to manage anxiety.  Also, she reports NO significant problems with alcohol or drug abuse or dependence.  With respect to suicidal ideation, the patient is NOT reporting distress from thoughts of self-harm. The patient's interest in and motivation for treatment is typical of individuals being seen in treatment settings, and she appears more motivated for treatment than adults who are not being seen in a therapeutic setting.  Her responses suggest an acknowledgement of important problems and the perception of a need for help in dealing with these problems.  She reports a positive attitude towards the possibility of personal change, the value of therapy, and the importance of personal responsibility.  In addition, she reports a number of other strengths that are positive indications for a relatively  smooth treatment process and a reasonably good prognosis.   Clinical Impressions: Bipolar Disorder, current episode depressed. Mild Neurocognitive Disorder, Unspecified (Rule out ADHD).  Ms. Herron's performance on cognitive testing revealed significant deficits in sustained attention. It should be noted that she took her Vyvanse (half of prescribed dose) for this evaluation, suggesting there may even be more of an attention deficit than is reflected here. Additionally, on  memory tests, the patient demonstrated difficulty with initial encoding of verbal information but this improved considerably with repetition of to-be-learned information, and retrieval was intact. This pattern of performance on memory testing is more indicative of attention deficits/executive dysfunction than a memory disorder. Overall, the patient's test results reflect what is commonly seen in adults with longstanding ADHD. The patient does carry a diagnosis of ADHD but she reports that she was  never formally tested or diagnosed with the condition in the past, and that she is prescribed Vyvanse for depression. Upon further questioning about developmental history, it remains unclear if she would have met diagnostic criteria for an attention disorder as a child. Regardless of whether or not there is underlying ADHD, her cognitive symptoms have clearly been exacerbated in recent months. Her current depressive episode may be contributing to this.  The etiology of her sporadic gait changes and other neurologic symptoms remains unclear at this time, but she is undergoing further workup and second opinion at [facility]. If a neurologic condition is found and diagnosed, this will help with differential diagnosis of current cognitive symptoms.   Recommendations/Plan: Based on the findings of the present evaluation, the following recommendations are offered:  1. Psychiatry evaluation/consultation and treatment was recommended but the  patient is unsure if she wants to do this at the present time. 2. Counseling for depression was also recommended but she does not feel this would be helpful. 3. She may benefit from behavioral strategies to compensate for attention/executive functioning deficits (see attached patient instructions).   Feedback to Patient: ANTHONELLA KLAUSNER returned for a feedback appointment on 06/21/2016 to review the results of her neuropsychological evaluation with this provider. 30 minutes face-to-face time was spent reviewing her test results, my impressions and my recommendations as detailed above.    Total time spent on this patient's case: [id number] unit for interview with psychologist; [phone] units of testing by psychometrician under psychologist's supervision; [phone] units for medical record review, scoring of neuropsychological tests, interpretation of test results, preparation of this report, and review of results to the patient by psychologist.      Thank you for your referral of KAIRI HARSHBARGER. Please feel free to contact me if you have any questions or concerns regarding this report.

## 2016-06-21 ENCOUNTER — Ambulatory Visit (INDEPENDENT_AMBULATORY_CARE_PROVIDER_SITE_OTHER): Payer: BLUE CROSS/BLUE SHIELD | Admitting: Psychology

## 2016-06-21 ENCOUNTER — Encounter: Payer: Self-pay | Admitting: Psychology

## 2016-06-21 DIAGNOSIS — G3184 Mild cognitive impairment, so stated: Secondary | ICD-10-CM

## 2016-06-21 DIAGNOSIS — R41840 Attention and concentration deficit: Secondary | ICD-10-CM | POA: Diagnosis not present

## 2016-06-21 NOTE — Patient Instructions (Signed)
Strategies to enhance cognitive functioning Attention, concentration, memory encoding and consolidation    . Make a plan and be prepared o If you find that you are more attentive at certain times of the day (i.e., the morning), plan important activities and discussions at that time o Determine which activities take the most time and which are most important, then prioritize your "to do list" based on this information o Break tasks into simpler parts, understand the steps involved before starting o Rehearse the steps mentally or write them down. If you write them down, you can use this as a checklist to check off as you complete them. o Visualize completing the task  . Use external aids  o Write everything down that you do not need to know or work with right now. Don't store extra information in your brain that you don't need right now.  o Use a calendar or planner to make checklists, due dates and "to do" lists. o Use ONLY ONE calendar or planner and look at it frequently o Set alarms for important deadlines or appointments  . Minimize interruptions and distractions  o Find a good work environment, e.g., quiet room with a desk, close curtains, use earplugs, mask sounds with a fan or white noise machine o Turn off cell phone and/or email alerts during important tasks. In fact, it is helpful to schedule a block of time each day where you limit phone and email interruptions and focus on just the more detailed work you have. o Try to minimize the amount of background noise (i.e., television, music) when engaged in important tasks or conversations with others (note that some individuals find soft background music helpful in minimize distraction, so you may need to experiment with optimal level of noise for specific situations)  . Use active effort = consciously attending to details, closely analyzing o Failures of encoding may reflect failure to attend to one's own actions o Be prepared to work  more slowly than you usually do  o When reading, allow time for re-reading sections  o Check your work for errors  . Avoid multitasking o Do not attempt to complete more than one task at one time. Focus on one task until it is completed and then move on to the next one. o Avoid other activities while engaged in important tasks, such as talking on the phone while balancing the checkbook, making a shopping list during a meeting.   . Use self-talk during tasks o Repeat the steps of the activity to yourself as you complete them o Talk to yourself about your progress o This helps you maintain focus on the task and makes it easier to remember completing the task (Similar to "active effort" above)  . Conserve energy o Conserve energy to avoid fatigue and its effects on cognition - Get enough sleep - Pace yourself  and make sure to take breaks - Be open to receiving help - Exercise for increased energy  . Conversational vigilance = paying attention during a conversation  o Listen actively: focus on the speaker and position yourself so that you can clearly hear the him/her, and have open/relaxed posture  o Eye contact: Maintaining eye contact with the person you are speaking with may increase the likelihood that important information is properly received  o Ask questions: Ask questions for clarification (e.g., request that the speaker explain something in a different way) or ask for information to be repeated if you become distracted, or if you do not   hear or understand something during a conversation  o Paraphrase: Summarize or repeat back important information from a conversation in your own words to facilitate communication and ensure that you have heard correctly and understand  

## 2016-06-28 ENCOUNTER — Telehealth: Payer: Self-pay | Admitting: Psychology

## 2016-06-28 NOTE — Progress Notes (Signed)
Patient had called and left messages with front desk staff that she was requesting a referral to a psychiatrist and to Dr. Arbutus Leas. I called the patient back and spoke with her more about her requests. She stated that she is interested in seeing a psychiatrist to evaluate options for ADHD other than Vyvanse. I agreed this may be helpful, and offered a referral to [facility], but she did not want to go there. She asked if I knew someone in private practice so I offered a referral to Dr. Milagros Evener, which she accepted.  She then stated that she had seen the note that I sent to Dr. Sherryll Burger (at her request, a note was sent instead of her full report), and that she was surprised that I did not say she had a memory problem. I reviewed the test results with her again and explained that while she may be having functional memory deficits, her testing suggested that this would be due to an attention deficit as opposed to a memory consolidation deficit. She appeared to understand after my explanation.  She then asked that I send the only the results of her testing pertaining to ADHD to her PCP, Dr. Cliffton Asters. I explained that it would be most helpful if I sent the full report, but she was opposed to this and then asked for a copy of her medical records. I informed her she could contact Medical Records in order to attain any records, and she stated she would do this.  She did not ask about a referral to Dr. Arbutus Leas during this phone call, but I have spoken with Dr. Arbutus Leas and we feel that there is not likely any more that can be offered since she has already seen two neurologists.  I will submit referral to Dr. Evelene Croon as requested by the patient.

## 2016-08-02 NOTE — Progress Notes (Deleted)
Patient's Name: Tabitha Bennett  MRN: [id number]  Referring Provider: Meade Maw, MD  DOB: February 10, 1966  PCP: Harlan Stains, MD  DOS: 08/09/2016  Note by: Dionisio David NP  Service setting: Ambulatory outpatient  Specialty: Interventional Pain Management  Location: [facility] (AMB) Pain Management Facility    Patient type: New Patient    Primary Reason(s) for Visit: Initial Patient Evaluation CC: No chief complaint on file.  HPI  [name] is a 51 y.o. year old, female patient, who comes today for an initial evaluation. She has Abnormal MRI on her problem list.. Her primarily concern today is the No chief complaint on file.  Pain Assessment: Self-Reported Pain Score:  /10 {Blank single:19197::"Clinically the patient looks like a","     "} {Blank single:19197::"0/10","1/10","2/10","3/10","4/10","5/10","     "} {Blank single:19197::"Reported level is inconsistent with clinical observations.","Clear symptom exaggeration. Reported level of pain is not compatible with clinical observations.","Reported level is compatible with observation."} {Blank single:19197::"Information on the proper use of the pain scale provided to the patient today","Exaggerated score may be due to the reporting of a "suffering" component","Score may indicate symptom exaggeration","     "}    Onset and Duration: {Hx; Onset and Duration:210120511} Cause of pain: {Hx; Cause:210120521} Severity: {Pain Severity:210120502} Timing: {Symptoms; Timing:210120501} Aggravating Factors: {Causes; Aggravating pain factors:210120507} Alleviating Factors: {Causes; Alleviating Factors:210120500} Associated Problems: {Hx; Associated problems:210120515} Quality of Pain: {Hx; Symptom quality or Descriptor:210120531} Previous Examinations or Tests: {Hx; Previous examinations or test:210120529} Previous Treatments: {Hx; Previous Treatment:210120503}  The patient comes into the clinics today for the first time for a chronic pain management  evaluation. ***  Today I took the time to provide the patient with information regarding this pain practice. The patient was informed that the practice is divided into two sections: an interventional pain management section, as well as a completely separate and distinct medication management section. I explained that there are procedure days for interventional therapies, and evaluation days for follow-ups and medication management. Because of the amount of documentation required during both, they are kept separated. This means that there is the possibility that *** may be scheduled for a procedure on one day, and medication management the next. I have also informed *** that because of staffing and facility limitations, this practice will no longer take patients for medication management only. To illustrate the reasons for this, I gave the patient the example of surgeons, and how inappropriate it would be to refer a patient to his/her care, just to write for the post-surgical antibiotics on a surgery done by a different surgeon.   Because interventional pain management is part of the board-certified specialty for the doctors, the patient was informed that joining this practice means that they are open to any and all interventional therapies. I made it clear that this does not mean that they will be forced to have any procedures done. What this means is that I believe interventional therapies to be essential part of the diagnosis and proper management of chronic pain conditions. Therefore, patients not interested in these interventional alternatives will be better served under the care of a different practitioner.  The patient was also made aware of my Comprehensive Pain Management Safety Guidelines where by joining this practice, they limit all of their nerve blocks and joint injections to those done by our practice, for as long as we are retained to manage their care. Historic Controlled Substance  Pharmacotherapy Review  PMP and historical list of controlled substances: *** Highest opioid analgesic regimen found: ***  Most recent opioid analgesic: *** Current opioid analgesics: *** Highest recorded MME/day: *** mg/day MME/day: *** mg/day Medications: {Blank single:19197::"Patient brought medications to be checked, as requested","The patient did not bring the medication(s) to the appointment, as requested in our "New Patient Package""} Pharmacodynamics: Desired effects: Analgesia: The patient reports {Blank single:19197::"no","<50%","50%",">50%"} benefit. Reported improvement in function: The patient reports {Blank single:19197::"being unable to accomplish basic ADLs.","medication allows her to have a normal productive life, including accomplishing all ADLs.","medication allows her to accomplish basic ADLs."} Clinically meaningful improvement in function (CMIF): {Blank single:19197::"Medication does not meet basic CMIF","Sustained CMIF goals met"} Perceived effectiveness: {Blank single:19197::"None","Described as ineffective and would like to make some changes","Described as relatively effective but with some room for improvement","Described as relatively effective, allowing for increase in activities of daily living (ADL)"} Undesirable effects: Side-effects or Adverse reactions: {Blank single:19197::"Minimal","Moderate","Significant","Constipation","Oversedation. This could suggest a drug interaction or accumulation","None reported"} Historical Monitoring: The patient  reports that she does not use drugs. List of all UDS Test(s): No results found for: MDMA, COCAINSCRNUR, PCPSCRNUR, PCPQUANT, CANNABQUANT, THCU, [place] List of all Serum Drug Screening Test(s):  No results found for: AMPHSCRSER, BARBSCRSER, BENZOSCRSER, COCAINSCRSER, PCPSCRSER, PCPQUANT, THCSCRSER, CANNABQUANT, OPIATESCRSER, OXYSCRSER, PROPOXSCRSER Historical Background Evaluation: [place] PDMP: {Blank single:19197::"   ","Unable  to conduct review of the controlled substance reporting system due to technological failure.","Online review of the past 44-monthperiod conducted.","Six (6) year initial data search conducted."} {Blank single:19197::"Abnormal pattern detected.","Regular, uninterrupted pattern of monthly opioid refills detected.","Discrepancies found between information provided by the patient and the database.","No abnormal patterns identified.","     "} {Blank single:19197::"Pattern of multiple prescribers found","Pattern of multiple pharmacies found","A pattern of multiple prescribers and multiple pharmacies was identified","     "} [place] Department of public safety, offender search: (Editor, commissioningInformation) {Blank single:19197::"Positive for","No criminal record(s) found","Non-contributory"} Risk Assessment Profile: Aberrant behavior: {Aberrant Behavior:210120800::"None observed or detected today"} Risk factors for fatal opioid overdose: {Risk Factors:210120801::"None identified today"} Fatal overdose hazard ratio (HR): {Blank single:19197::"1.32 for 20-49 MME/day","1.92 for 50-99 MME/day","2.04 for doses equal to, or higher than 100 MME/day","Calculation deferred"} Non-fatal overdose hazard ratio (HR): {Blank single:19197::"1.44 for 20-49 MME/day","3.73 for 50-99 MME/day","8.87 for 100-199 MME/day","2.88 for doses equal to, or higher than 200 MME/day","Calculation deferred"} Risk of opioid abuse or dependence: 0.7-3.0% with doses ? 36 MME/day and 6.1-26% with doses ? 120 MME/day. Substance use disorder (SUD) risk level: {Blank single:19197::"High","Moderate","Low","Pending results of Medical Psychology Evaluation for SUD"} Opioid risk tool (ORT) (Total Score):    ORT Scoring interpretation table:  Score <3 = Low Risk for SUD  Score between 4-7 = Moderate Risk for SUD  Score >8 = High Risk for Opioid Abuse   PHQ-2 Depression Scale:  Total score:    PHQ-2 Scoring interpretation table: (Score and probability of major  depressive disorder)  Score 0 = No depression  Score 1 = 15.4% Probability  Score 2 = 21.1% Probability  Score 3 = 38.4% Probability  Score 4 = 45.5% Probability  Score 5 = 56.4% Probability  Score 6 = 78.6% Probability   PHQ-9 Depression Scale:  Total score:    PHQ-9 Scoring interpretation table:  Score 0-4 = No depression  Score 5-9 = Mild depression  Score 10-14 = Moderate depression  Score 15-19 = Moderately severe depression  Score 20-27 = Severe depression (2.4 times higher risk of SUD and 2.89 times higher risk of overuse)   Pharmacologic Plan: {Blank single:19197::"Adjust therapy: ***","Discontinue therapy","Continue therapy as is","Pending ordered tests and/or consults"}  Meds  The patient has a current medication  list which includes the following prescription(s): citalopram, estradiol, lamotrigine, lisdexamfetamine, and progesterone.  Current Outpatient Prescriptions on File Prior to Visit  Medication Sig  . citalopram (CELEXA) 20 MG tablet Take 30 mg by mouth daily.  Marland Kitchen estradiol (ESTRACE) 0.5 MG tablet Take 0.5 mg by mouth at bedtime.   . lamoTRIgine (LAMICTAL) 100 MG tablet Take 100-200 mg by mouth See admin instructions. Take 293m by mouth in the morning, and 1027mby mouth at bedtime  . lisdexamfetamine (VYVANSE) 30 MG capsule Take 30 mg by mouth daily.  . progesterone (PROMETRIUM) 200 MG capsule Take 200 mg by mouth at bedtime.   No current facility-administered medications on file prior to visit.    Imaging Review  Cervical Imaging: Cervical MR wo contrast:  Results for orders placed during the hospital encounter of 04/22/16  MR Cervical Spine Wo Contrast   Narrative CLINICAL DATA:  89/o  F; lower extremity weakness and ataxia.  EXAM: MRI CERVICAL, THORACIC AND LUMBAR SPINE WITHOUT CONTRAST  TECHNIQUE: Multiplanar and multiecho pulse sequences of the cervical spine, to include the craniocervical junction and cervicothoracic junction, and thoracic and  lumbar spine, were obtained without intravenous contrast.  COMPARISON:  Concurrent MRI of the brain. 11/24/2010 lumbar MRI. 11/25/2010 thoracic MRI.  FINDINGS: MRI CERVICAL SPINE FINDINGS  Alignment: Mild reversal of cervical lordosis and grade 1 degenerative anterolisthesis at C3-4.  Vertebrae: No fracture, evidence of discitis, or bone lesion.  Cord: No abnormal cord signal.  Posterior Fossa, vertebral arteries, paraspinal tissues: Negative.  Disc levels:  C2-3: No significant disc displacement, foraminal narrowing, or canal stenosis. Mild facet hypertrophy.  C3-4: Anterolisthesis with small uncovered disc bulge and mild facet hypertrophy. Mild bilateral foraminal narrowing. No significant canal stenosis.  C4-5: No significant disc displacement, foraminal narrowing, or canal stenosis.  C5-6: Small disc osteophyte complex with uncovertebral and facet hypertrophy. Moderate left and mild right foraminal narrowing. Mild canal stenosis.  C6-7: Disc osteophyte complex eccentric to the right and right-sided uncovertebral hypertrophy. Moderate right foraminal narrowing. No significant left foraminal narrowing or canal stenosis.  C7-T1: No significant disc displacement, foraminal narrowing, or canal stenosis.  MRI THORACIC SPINE FINDINGS  Alignment:  Physiologic.  Vertebrae: Stable T7 inferior endplate Schmorl's node. No evidence of acute discitis, fracture, or bone lesion.  Cord:  Normal signal and morphology.  Paraspinal and other soft tissues: Negative.  Disc levels:  Tiny T8-9 and T11-12 right subarticular disc protrusion without cord or nerve root contact are new.  MRI LUMBAR SPINE FINDINGS  Segmentation:  Standard.  Alignment:  Physiologic.  Vertebrae:  No fracture, evidence of discitis, or bone lesion.  Conus medullaris: Extends to the L1 level and appears normal.  Paraspinal and other soft tissues: Negative.  Disc levels:  L1-2: No significant  disc displacement, foraminal narrowing, or canal stenosis.  L2-3: Small disc bulge eccentric to the left. No significant foraminal narrowing or canal stenosis.  L3-4: Increased moderate disc bulge eccentric to the left with moderate facet and ligamentum flavum hypertrophy. Mild bilateral foraminal and lateral recess narrowing. No significant canal stenosis.  L4-5: Increased moderate disc bulge with small central protrusion and annular fissure combined with moderate facet and ligamentum flavum hypertrophy. Mild bilateral foraminal and lateral recess narrowing with mild canal stenosis.  L5-S1: Minimal disc bulge and mild facet hypertrophy. No significant foraminal narrowing or canal stenosis.  IMPRESSION: 1. No acute osseous abnormality or abnormal cord signal of the cervical, thoracic, and lumbar spine. 2. No high-grade canal stenosis or foraminal narrowing  of the cervical, thoracic, and lumbar spine. 3. Multilevel cervical spondylosis greatest at the C5-6 level where there is moderate left mild right foraminal narrowing and mild canal stenosis. 4. New tiny T8-9 and T11-12 right subarticular disc protrusions without cord or nerve root contact. 5. Progression of lumbar spondylosis at L3-4 where there is mild bilateral foraminal narrowing and at L4-5 where there is mild foraminal narrowing and mild canal stenosis.   Electronically Signed   By: Kristine Garbe M.D.   On: 04/22/2016 23:03    Cervical MR wo contrast:  Cervical MR w/wo contrast:  Cervical MR w contrast:  Cervical CT wo contrast:  Cervical CT w/wo contrast:  Cervical CT w/wo contrast:  Cervical CT w contrast:  Cervical CT outside:  Cervical DG 1 view:  Cervical DG 2-3 views:  Cervical DG F/E views:  Cervical DG 2-3 clearing views:  Cervical DG Bending/F/E views:  Cervical DG complete:  Cervical DG Myelogram views:  Cervical DG Myelogram views:  Cervical Discogram views:   Shoulder  Imaging: Shoulder-R MR w contrast:  Shoulder-L MR w contrast:  Shoulder-R MR w/wo contrast:  Shoulder-L MR w/wo contrast:  Shoulder-R MR wo contrast:  Shoulder-L MR wo contrast:  Shoulder-R CT w contrast:  Shoulder-L CT w contrast:  Shoulder-R CT w/wo contrast:  Shoulder-L CT w/wo contrast:  Shoulder-R CT wo contrast:  Shoulder-L CT wo contrast:  Automotive engineer DG Arthrogram:  Shoulder-L DG Arthrogram:  Shoulder-R DG 1 view:  Shoulder-L DG 1 view:  Shoulder-R DG:  Shoulder-L DG:   Thoracic Imaging: Thoracic MR wo contrast:  Results for orders placed during the hospital encounter of 04/22/16  MR THORACIC SPINE WO CONTRAST   Narrative CLINICAL DATA:  51 y/o  F; lower extremity weakness and ataxia.  EXAM: MRI CERVICAL, THORACIC AND LUMBAR SPINE WITHOUT CONTRAST  TECHNIQUE: Multiplanar and multiecho pulse sequences of the cervical spine, to include the craniocervical junction and cervicothoracic junction, and thoracic and lumbar spine, were obtained without intravenous contrast.  COMPARISON:  Concurrent MRI of the brain. 11/24/2010 lumbar MRI. 11/25/2010 thoracic MRI.  FINDINGS: MRI CERVICAL SPINE FINDINGS  Alignment: Mild reversal of cervical lordosis and grade 1 degenerative anterolisthesis at C3-4.  Vertebrae: No fracture, evidence of discitis, or bone lesion.  Cord: No abnormal cord signal.  Posterior Fossa, vertebral arteries, paraspinal tissues: Negative.  Disc levels:  C2-3: No significant disc displacement, foraminal narrowing, or canal stenosis. Mild facet hypertrophy.  C3-4: Anterolisthesis with small uncovered disc bulge and mild facet hypertrophy. Mild bilateral foraminal narrowing. No significant canal stenosis.  C4-5: No significant disc displacement, foraminal narrowing, or canal stenosis.  C5-6: Small disc osteophyte complex with uncovertebral and facet hypertrophy. Moderate left and mild right foraminal narrowing. Mild canal stenosis.  C6-7:  Disc osteophyte complex eccentric to the right and right-sided uncovertebral hypertrophy. Moderate right foraminal narrowing. No significant left foraminal narrowing or canal stenosis.  C7-T1: No significant disc displacement, foraminal narrowing, or canal stenosis.  MRI THORACIC SPINE FINDINGS  Alignment:  Physiologic.  Vertebrae: Stable T7 inferior endplate Schmorl's node. No evidence of acute discitis, fracture, or bone lesion.  Cord:  Normal signal and morphology.  Paraspinal and other soft tissues: Negative.  Disc levels:  Tiny T8-9 and T11-12 right subarticular disc protrusion without cord or nerve root contact are new.  MRI LUMBAR SPINE FINDINGS  Segmentation:  Standard.  Alignment:  Physiologic.  Vertebrae:  No fracture, evidence of discitis, or bone lesion.  Conus medullaris: Extends to the L1 level and appears normal.  Paraspinal and other soft tissues: Negative.  Disc levels:  L1-2: No significant disc displacement, foraminal narrowing, or canal stenosis.  L2-3: Small disc bulge eccentric to the left. No significant foraminal narrowing or canal stenosis.  L3-4: Increased moderate disc bulge eccentric to the left with moderate facet and ligamentum flavum hypertrophy. Mild bilateral foraminal and lateral recess narrowing. No significant canal stenosis.  L4-5: Increased moderate disc bulge with small central protrusion and annular fissure combined with moderate facet and ligamentum flavum hypertrophy. Mild bilateral foraminal and lateral recess narrowing with mild canal stenosis.  L5-S1: Minimal disc bulge and mild facet hypertrophy. No significant foraminal narrowing or canal stenosis.  IMPRESSION: 1. No acute osseous abnormality or abnormal cord signal of the cervical, thoracic, and lumbar spine. 2. No high-grade canal stenosis or foraminal narrowing of the cervical, thoracic, and lumbar spine. 3. Multilevel cervical spondylosis greatest at the  C5-6 level where there is moderate left mild right foraminal narrowing and mild canal stenosis. 4. New tiny T8-9 and T11-12 right subarticular disc protrusions without cord or nerve root contact. 5. Progression of lumbar spondylosis at L3-4 where there is mild bilateral foraminal narrowing and at L4-5 where there is mild foraminal narrowing and mild canal stenosis.   Electronically Signed   By: Kristine Garbe M.D.   On: 04/22/2016 23:03    Thoracic MR wo contrast:  Thoracic MR w/wo contrast:  Results for orders placed during the hospital encounter of 11/24/10  MR Thoracic Spine W Wo Contrast   Narrative *RADIOLOGY REPORT*  Clinical Data: 51 year old female with unexplained weakness.  MRI THORACIC SPINE WITHOUT AND WITH CONTRAST  Technique:  Multiplanar and multiecho pulse sequences of the thoracic spine were obtained without and with intravenous contrast.  Contrast: 23m MULTIHANCE GADOBENATE DIMEGLUMINE 529 MG/ML IV SOLN In conjunction with brain MRI which is reported separately.  Comparison: Lumbar MRI 11/24/2010.  Findings: Limited sagittal imaging of the cervical spine is suggestive of multi level mild lower cervical disc bulges.  No cervical spinal stenosis suspected.  Visualized paraspinal soft tissues are within normal limits. Negative visualized thoracic viscera except for mild dependent atelectasis.  Negative visualized upper abdominal viscera.  Mild wedging and chronic inferior endplate deformity at T7. Otherwise normal thoracic vertebral height, alignment, and marrow signal. No marrow edema or evidence of acute osseous abnormality.  Spinal cord signal is within normal limits at all visualized levels.  Normal conus medullaris re-identified at L1.  No abnormal intradural enhancement.  No abnormal enhancement identified elsewhere.  Occasional mild thoracic disc bulges.  No thoracic spinal or neural foraminal stenosis.  IMPRESSION:  1.  Negative  thoracic MRI except for mild chronic T7 inferior endplate deformity. 2.  Brain MRI reported separately.  Original Report Authenticated By: HRandall An M.D.   Thoracic MR w contrast:  Thoracic CT wo contrast:  Thoracic CT w/wo contrast:  Thoracic CT w/wo contrast:  Thoracic CT w contrast:  Thoracic DG 2-3 views:  Thoracic DG 4 views:  Thoracic DG:  Thoracic DG w/swimmers view:  Thoracic DG Myelogram views:  Thoracic DG Myelogram views:   Lumbosacral Imaging: Lumbar MR wo contrast:  Results for orders placed during the hospital encounter of 04/22/16  MR LUMBAR SPINE WO CONTRAST   Narrative CLINICAL DATA:  51y/o  F; lower extremity weakness and ataxia.  EXAM: MRI CERVICAL, THORACIC AND LUMBAR SPINE WITHOUT CONTRAST  TECHNIQUE: Multiplanar and multiecho pulse sequences of the cervical spine, to include the craniocervical junction and cervicothoracic junction, and thoracic and  lumbar spine, were obtained without intravenous contrast.  COMPARISON:  Concurrent MRI of the brain. 11/24/2010 lumbar MRI. 11/25/2010 thoracic MRI.  FINDINGS: MRI CERVICAL SPINE FINDINGS  Alignment: Mild reversal of cervical lordosis and grade 1 degenerative anterolisthesis at C3-4.  Vertebrae: No fracture, evidence of discitis, or bone lesion.  Cord: No abnormal cord signal.  Posterior Fossa, vertebral arteries, paraspinal tissues: Negative.  Disc levels:  C2-3: No significant disc displacement, foraminal narrowing, or canal stenosis. Mild facet hypertrophy.  C3-4: Anterolisthesis with small uncovered disc bulge and mild facet hypertrophy. Mild bilateral foraminal narrowing. No significant canal stenosis.  C4-5: No significant disc displacement, foraminal narrowing, or canal stenosis.  C5-6: Small disc osteophyte complex with uncovertebral and facet hypertrophy. Moderate left and mild right foraminal narrowing. Mild canal stenosis.  C6-7: Disc osteophyte complex eccentric to the  right and right-sided uncovertebral hypertrophy. Moderate right foraminal narrowing. No significant left foraminal narrowing or canal stenosis.  C7-T1: No significant disc displacement, foraminal narrowing, or canal stenosis.  MRI THORACIC SPINE FINDINGS  Alignment:  Physiologic.  Vertebrae: Stable T7 inferior endplate Schmorl's node. No evidence of acute discitis, fracture, or bone lesion.  Cord:  Normal signal and morphology.  Paraspinal and other soft tissues: Negative.  Disc levels:  Tiny T8-9 and T11-12 right subarticular disc protrusion without cord or nerve root contact are new.  MRI LUMBAR SPINE FINDINGS  Segmentation:  Standard.  Alignment:  Physiologic.  Vertebrae:  No fracture, evidence of discitis, or bone lesion.  Conus medullaris: Extends to the L1 level and appears normal.  Paraspinal and other soft tissues: Negative.  Disc levels:  L1-2: No significant disc displacement, foraminal narrowing, or canal stenosis.  L2-3: Small disc bulge eccentric to the left. No significant foraminal narrowing or canal stenosis.  L3-4: Increased moderate disc bulge eccentric to the left with moderate facet and ligamentum flavum hypertrophy. Mild bilateral foraminal and lateral recess narrowing. No significant canal stenosis.  L4-5: Increased moderate disc bulge with small central protrusion and annular fissure combined with moderate facet and ligamentum flavum hypertrophy. Mild bilateral foraminal and lateral recess narrowing with mild canal stenosis.  L5-S1: Minimal disc bulge and mild facet hypertrophy. No significant foraminal narrowing or canal stenosis.  IMPRESSION: 1. No acute osseous abnormality or abnormal cord signal of the cervical, thoracic, and lumbar spine. 2. No high-grade canal stenosis or foraminal narrowing of the cervical, thoracic, and lumbar spine. 3. Multilevel cervical spondylosis greatest at the C5-6 level where there is moderate left  mild right foraminal narrowing and mild canal stenosis. 4. New tiny T8-9 and T11-12 right subarticular disc protrusions without cord or nerve root contact. 5. Progression of lumbar spondylosis at L3-4 where there is mild bilateral foraminal narrowing and at L4-5 where there is mild foraminal narrowing and mild canal stenosis.   Electronically Signed   By: Kristine Garbe M.D.   On: 04/22/2016 23:03    Lumbar MR wo contrast:  Lumbar MR w/wo contrast:  Lumbar MR w contrast:  Lumbar CT wo contrast:  Lumbar CT w/wo contrast:  Lumbar CT w/wo contrast:  Lumbar CT w contrast:  Lumbar DG 1V:  Lumbar DG 1V (Clearing):  Lumbar DG 2-3V (Clearing):  Lumbar DG 2-3 views:  Lumbar DG (Complete) 4+V:  Lumbar DG F/E views:  Lumbar DG Bending views:  Lumbar DG Myelogram views:  Lumbar DG Myelogram:  Lumbar DG Myelogram:  Lumbar DG Myelogram:  Lumbar DG Myelogram Lumbosacral:  Lumbar DG Diskogram views:  Lumbar DG Diskogram views:  Lumbar DG  Epidurogram OP:  Lumbar DG Epidurogram IP:   Sacroiliac Joint Imaging: Sacroiliac Joint DG:  Sacroiliac Joint MR w/wo contrast:  Sacroiliac Joint MR wo contrast:   Spine Imaging: Whole Spine DG Myelogram views:  Whole Spine MR Mets screen:  Whole Spine MR Mets screen:  Whole Spine MR w/wo:  MRA Spinal Canal w/ cm:  MRA Spinal Canal wo/ cm:  MRA Spinal Canal w/wo cm:  Spine Outside MR Films:  Spine Outside CT Films:  CT-Guided Biopsy:  CT-Guided Needle Placement:  DG Spine outside:  IR Spine outside:  NM Spine outside:   Hip Imaging: Hip-R MR w contrast:  Hip-L MR w contrast:  Hip-R MR w/wo contrast:  Hip-L MR w/wo contrast:  Hip-R MR wo contrast:  Hip-L MR wo contrast:  Hip-R CT w contrast:  Hip-L CT w contrast:  Hip-R CT w/wo contrast:  Hip-L CT w/wo contrast:  Hip-R CT wo contrast:  Hip-L CT wo contrast:  Hip-R DG 2-3 views:  Hip-L DG 2-3 views:  Hip-R DG Arthrogram:  Hip-L DG Arthrogram:  Hip-B DG Bilateral:    Knee Imaging: Knee-R MR w contrast:  Knee-L MR w contrast:  Knee-R MR w/wo contrast:  Knee-L MR w/wo contrast:  Knee-R MR wo contrast:  Knee-L MR wo contrast:  Knee-R CT w contrast:  Knee-L CT w contrast:  Knee-R CT w/wo contrast:  Knee-L CT w/wo contrast:  Knee-R CT wo contrast:  Knee-L CT wo contrast:  Knee-R DG 1-2 views:  Knee-L DG 1-2 views:  Knee-R DG 3 views:  Knee-L DG 3 views:  Knee-R DG 4 views:  Knee-L DG 4 views:  Knee-R DG Arthrogram:  Knee-L DG Arthrogram:   Note: {Blank single:19197::"No new results found.","No results found under the [employer] electronic medical record.","Imaging results reviewed and explained to patient in Layman's terms.","Results of ordered imaging test(s) reviewed and explained to patient in Layman's terms.","Imaging results reviewed.","Available results from prior imaging studies were reviewed."} {Blank single:19197::"Results made available to patient","Copy of results provided to patient","     "}  ROS  Cardiovascular History: {Hx; Cardiovascular History:210120525} Pulmonary or Respiratory History: {Hx; Pumonary and/or Respiratory History:210120523} Neurological History: {Hx; Neurological:210120504} Review of Past Neurological Studies:  Results for orders placed or performed in visit on 05/08/16  [facility] [address] [place], [place] [address] [phone]  NEUROIMAGING REPORT   STUDY DATE: 05/07/2016 PATIENT NAME: Tabitha Bennett DOB: Jan 12, 1966 MRN: [id number]  ORDERING CLINICIAN:  DrSethi CLINICAL HISTORY:  51 year patient with abnormal MRI brain COMPARISON FILMS: MRI Brain wo 04/22/2016 EXAM: MRI Brain with only TECHNIQUE: post contrast sagittal, T1 and coronal images were obtained  through the brain  CONTRAST:  Iv gadavist IMAGING SITE: [facility]  FINDINGS:  Limited postcontrast images were obtained only. No abnormal enhancing   lesion is noted. The brain parenchyma appears unremarkable. The orbits,  paranasal sinuses and visualized portion of the cervical spine show no  abnormalities.      Impression    Normal limited postcontrast MRI scan of the brain.  INTERPRETING PHYSICIAN:  Antony Contras, MD Certified in  Neuroimaging by [employer] [facility] and [place] [facility] Subspecial[facility]    Results for orders placed or performed during the hospital encounter of 04/22/16  MR Brain Wo Contrast   Narrative   CLINICAL DATA:  51 y/o F; weakness, ataxia, bilateral leg weakness, and unstable gait.  EXAM: MRI  HEAD WITHOUT CONTRAST  TECHNIQUE: Multiplanar, multiecho pulse sequences of the brain and surrounding structures were obtained without intravenous contrast.  COMPARISON:  11/25/2010 MRI of the head.  FINDINGS: Brain: Enlarged pituitary measuring 11 mm craniocaudal with convex superior margin, possibly in contact with the optic chiasm. In comparison with prior MRI this may be minimally increased in size.  Nonspecific scattered foci of T2 FLAIR hyperintense foci predominantly in subcortical white matter, with few foci in the left cerebellar hemisphere, is are mildly progressed in comparison with the prior MRI of the brain. No lesion is identified within the brainstem, basal ganglia, or corpus callosum.  No focal mass effect. No abnormal susceptibility hypointensity to indicate intracranial hemorrhage. No diffusion signal abnormality. No extra-axial collection. No hydrocephalus.  Vascular: Normal flow voids.  Skull and upper cervical spine: Normal marrow signal.  Sinuses/Orbits: Negative.  Other: None.  IMPRESSION: 1. No acute intracranial abnormality identified. 2. Foci of white matter T2 FLAIR hyperintensity probably represent mild chronic microvascular ischemic changes and are mildly progressed from 2012. 3. Enlarged pituitary gland may represent an underlying  pituitary adenoma, stable to minimally increased in size from 2012. This can be further characterized with a pituitary protocol MRI of the brain with and without intravenous contrast.   Electronically Signed   By: Kristine Garbe M.D.   On: 04/22/2016 22:46   CT HEAD WO CONTRAST   Narrative   CLINICAL DATA:  Lower extremity weakness, ataxia  EXAM: CT HEAD WITHOUT CONTRAST  TECHNIQUE: Contiguous axial images were obtained from the base of the skull through the vertex without intravenous contrast.  COMPARISON:  None.  FINDINGS: Brain: No evidence of acute infarction, hemorrhage, hydrocephalus, extra-axial collection or mass lesion/mass effect.  Subcortical white matter and periventricular small vessel ischemic changes.  Vascular: No hyperdense vessel or unexpected calcification.  Skull: Normal. Negative for fracture or focal lesion.  Sinuses/Orbits: No acute finding.  Other: None.  IMPRESSION: No evidence of acute intracranial abnormality.  Mild small vessel ischemic changes.   Electronically Signed   By: Julian Hy M.D.   On: 04/22/2016 17:47   Results for orders placed or performed during the hospital encounter of 11/24/10  MR Brain W Wo Contrast   Narrative   *RADIOLOGY REPORT*  Clinical Data: 51 year old female with gait dysfunction, lower extremity weakness.  MRI HEAD WITHOUT AND WITH CONTRAST  Technique:  Multiplanar, multiecho pulse sequences of the brain and surrounding structures were obtained according to standard protocol without and with intravenous contrast  Contrast: 59m MULTIHANCE GADOBENATE DIMEGLUMINE 529 MG/ML IV SOLN In conjunction with MR of the thoracic MRI which is reported separately.  Comparison: None.  Findings: Normal cerebral volume. No restricted diffusion to suggest acute infarction.  No midline shift, mass effect, evidence of mass lesion, ventriculomegaly, extra-axial collection or acute intracranial  hemorrhage.  Cervicomedullary junction and pituitary (incidental congenitally narrow sella turcica) are within normal limits.  Major intracranial vascular flow voids are preserved. Scattered small T2 and FLAIR hyperintense foci in the cerebral white matter, mostly subcortical.  No associated enhancement.  Deep gray matter nuclei, brainstem, and cerebellum within normal limits. Negative visualized cervical spine. No abnormal enhancement identified.  Visualized bone marrow signal is within normal limits.  Visualized paranasal sinuses and mastoids are clear.  Visualized orbit soft tissues are within normal limits.  Visualized scalp soft tissues are within normal limits.  IMPRESSION: 1. No acute intracranial abnormality.  Mildly age advanced nonspecific cerebral white matter signal changes.  No associated enhancement. 2.  Otherwise normal MRI appearance of the brain. 3.  Thoracic MRI reported separately.  Original Report Authenticated By: Randall An, M.D.   Psychological-Psychiatric History: {Hx; Psychological-Psychiatric History:210120512} Gastrointestinal History: {Hx; Gastrointestinal:210120527} Genitourinary History: {Hx; Genitourinary:210120506} Hematological History: {Hx; Hematological:210120510} Endocrine History: {Hx; Endocrine history:210120509} Rheumatologic History: {Hx; Rheumatological:210120530} Musculoskeletal History: {Hx; Musculoskeletal:210120528} Work History: {Hx; Work history:210120514}  Allergies  [name] has No Known Allergies.  Laboratory Chemistry  Inflammation Markers Lab Results  Component Value Date   ESRSEDRATE 2 11/25/2010   (CRP: Acute Phase) (ESR: Chronic Phase) Renal Function Markers Lab Results  Component Value Date   BUN 13 05/03/2016   CREATININE 1.02 (H) 05/03/2016   GFRAA >60 05/03/2016   GFRNONAA >60 05/03/2016   Hepatic Function Markers Lab Results  Component Value Date   AST 21 11/25/2010   ALT 18 11/25/2010   ALBUMIN  3.0 (L) 11/25/2010   ALKPHOS 43 11/25/2010   Electrolytes Lab Results  Component Value Date   NA 138 05/03/2016   K 3.8 05/03/2016   CL 104 05/03/2016   CALCIUM 9.2 05/03/2016   Neuropathy Markers Lab Results  Component Value Date   VITAMINB12 1,476 (H) 04/22/2016   Bone Pathology Markers Lab Results  Component Value Date   ALKPHOS 43 11/25/2010   CALCIUM 9.2 05/03/2016   Coagulation Parameters Lab Results  Component Value Date   INR 0.92 11/25/2010   LABPROT 12.6 11/25/2010   PLT 279 05/03/2016   Cardiovascular Markers Lab Results  Component Value Date   HGB 12.8 05/03/2016   HCT 39.5 05/03/2016   Note: {Blank single:19197::"No results found under the [employer] electronic medical record","Results made available to patient.","Lab results reviewed and made available to patient.","Lab results reviewed and explained to patient in Layman's terms.","Lab results reviewed."}  PFSH  Drug: [name]  reports that she does not use drugs. Alcohol:  reports that she drinks about 0.6 oz of alcohol per week . Tobacco:  reports that she has never smoked. She has never used smokeless tobacco. Medical:  has a past medical history of Bipolar 1 disorder ([place]). Family: family history is not on file.  No past surgical history on file. Active Ambulatory Problems    Diagnosis Date Noted  . Abnormal MRI 04/25/2016   Resolved Ambulatory Problems    Diagnosis Date Noted  . No Resolved Ambulatory Problems   Past Medical History:  Diagnosis Date  . Bipolar 1 disorder ([place])    Constitutional Exam  General appearance: {general exam:210120802::"Well nourished, well developed, and well hydrated. In no apparent acute distress"} There were no vitals filed for this visit. BMI Assessment: Estimated body mass index is 20.89 kg/m as calculated from the following:   Height as of 04/25/16: '5\' 6"'  (1.676 m).   Weight as of 04/25/16: 129 lb 6.4 oz (58.7 kg).  BMI interpretation table: BMI  level Category Range association with higher incidence of chronic pain  <18 kg/m2 Underweight   18.5-24.9 kg/m2 Ideal body weight   25-29.9 kg/m2 Overweight Increased incidence by 20%  30-34.9 kg/m2 Obese (Class I) Increased incidence by 68%  35-39.9 kg/m2 Severe obesity (Class II) Increased incidence by 136%  >40 kg/m2 Extreme obesity (Class III) Increased incidence by 254%   BMI Readings from Last 4 Encounters:  04/25/16 20.89 kg/m  04/22/16 20.60 kg/m  04/22/16 20.52 kg/m  04/22/16 20.52 kg/m   Wt Readings from Last 4 Encounters:  04/25/16 129 lb 6.4 oz (58.7 kg)  04/22/16 131 lb 8 oz (59.6 kg)  04/22/16 131  lb (59.4 kg)  04/22/16 131 lb (59.4 kg)  Psych/Mental status: {Blank single:19197::"Alert and oriented x 3. Exaggerated physical and/or psychosocial pain behavior perceived.","Alert, oriented x 3 (person, place, & time)"} {Blank single:19197::"Ms. Herron's speech pattern and demeanor seems to suggest oversedation","     "} Eyes: {Blank single:19197::"Miotic (pupilary constriction) due to opiate use","Midriatic","Anisocoric","Evidence of ptosis","Pin-point pupils","PERLA"} Respiratory: {Blank single:19197::"Oxygen-dependent COPD","No evidence of acute respiratory distress"}  Cervical Spine Exam  Inspection: {Blank single:19197::"Well healed scar from previous spine surgery detected","Paravertebral muscle atrophy","No masses, redness, or swelling"} Alignment: {Blank single:19197::"Asymmetric","Symmetrical"} Functional ROM: {Blank single:19197::"Improved after treatment","Adequate ROM","Decreased ROM","Diminished ROM","Full ROM","Fused","Grossly intact ROM","Guarding","Limited ROM","Mechanically restricted ROM","Minimal ROM","Pain restricted ROM","Restricted ROM","Zero ROM","ROM appears unrestricted","ROM is within functional limits (WFL)","ROM is within normal limits (WNL)","Unrestricted ROM"}{Blank single:19197::", to the right",", to the left",", bilaterally","     "} Stability:  {Blank single:19197::"Possibly unstable","No instability detected"} Muscle strength & Tone: {Blank single:19197::"Inconsistent level of performance when tested","Guarding observed","Functionally intact"} Sensory: {Blank single:19197::"Improved","Movement-associated pain","Movement-associated discomfort","Impaired sensorium","Articular pain pattern","Arthropathic arthralgia","Dermatomal pain pattern","Musculoskeletal pain pattern","Myotome pain pattern","Neurogenic pain pattern","Neuropathic pain pattern","Referred pain pattern","Visceral pain pattern","Allodynia (Painful response to non-painful stimuli)","Anesthesia (Absence of sensation)","Anesthesia Dolorosa (Numbness over painful area)","Dysesthesias (Unpleasant sensation to touch)","Hyperalgesia (Increased sensitivity to pain)","Hyperesthesia (Increased sensitivity to touch)","Hyperpathia (Painful, exaggerated response to nociceptive stimuli)","Hypoalgesia (Decreased sensitivity to painful stimuli)","Hypoesthesia/Hypesthesia (Reduced sensation to touch)","Paresthesia (Burning sensation)","Paresthesia (Tingling sensation)","WNL","No anomaly detected","Unimpaired"} Palpation: {Blank single:19197::"Tender","Complains of area being tender to palpation","Uncomfortable","Positive","Negative","Increased muscle tone","Trigger Point","Muscular Atrophy","Non-tender","No complaints of tenderness","Hyperthermic","Hypothermic","Euthermic","No palpable anomalies"} {Blank single:19197::"Cervical compression test","Tinel's test","Trousseau's test (3 minute blood pressure cuff test) for hypocalcemia","     "} {Blank single:19197::"for right occipital neuralgia","for left occipital neuralgia","for bilateral occipital neuralgia","for cervical facet disease","     "}  Upper Extremity (UE) Exam    Side: Right upper extremity  Side: Left upper extremity  Inspection: {Blank single:19197::"Below elbow amputation (BEA)","Above elbow amputation  (AEA)","Contracture","Atrophy","Dystrophy","Degenerative arthropathy with ulnar deviation","Degenerative deforming arthropathy","Heberden's nodes (DIP)","Bouchard's nodes (PIP)","No gross anomalies detected","Edema","Positive color changes","Some redness observed","Increased temperature","Acrocyanosis","Normal skin color, temperature, and hair growth. No peripheral edema or cyanosis","No masses, redness, swelling, or asymmetry. No contractures"}  Inspection: {Blank single:19197::"Below elbow amputation (BEA)","Above elbow amputation (AEA)","Contracture","Atrophy","Dystrophy","Degenerative arthropathy with ulnar deviation","Degenerative deforming arthropathy","Heberden's nodes (DIP)","Bouchard's nodes (PIP)","No gross anomalies detected","Edema","Positive color changes","Some redness observed","Increased temperature","Acrocyanosis","Normal skin color, temperature, and hair growth. No peripheral edema or cyanosis","No masses, redness, swelling, or asymmetry. No contractures"}  Functional ROM: {Blank single:19197::"Improved after treatment","Impaired ROM","Adequate ROM","Decreased ROM","Diminished ROM","Full ROM","Fused","Grossly intact ROM","Guarding","Limited ROM","Mechanically restricted ROM","Minimal ROM","Pain restricted ROM","Restricted ROM","Zero ROM","ROM appears unrestricted","ROM is within functional limits (WFL)","ROM is within normal limits (WNL)","Unrestricted ROM"} {Blank single:19197::"for shoulder","for elbow","for shoulder and elbow","for wrist","for wrist and hand","for hand","for all joints of upper extremity","       "}  Functional ROM: {Blank single:19197::"Improved after treatment","Impaired ROM","Adequate ROM","Decreased ROM","Diminished ROM","Full ROM","Fused","Grossly intact ROM","Guarding","Limited ROM","Mechanically restricted ROM","Minimal ROM","Pain restricted ROM","Restricted ROM","Zero ROM","ROM appears unrestricted","ROM is within functional limits (WFL)","ROM is within normal limits  (WNL)","Unrestricted ROM"} {Blank single:19197::"for shoulder","for elbow","for shoulder and elbow","for wrist","for wrist and hand","for hand","for all joints of upper extremity","       "}  Muscle strength & Tone: {Blank single:19197::"Inconsistent level of performance when tested","Normal strength (5/5)","Movement possible against some resistance (4/5)","Movement possible against gravity, but not against resistance (3/5)","Movement possible, but not against gravity (1/6)","XWRUEA flickering, but no movement (1/5)","No motor contraction (0/5)","Flaccid paralysis","Spastic paralysis","Cogwheel rigidity","Clasp-knife rigidity","Give-away weakness","Deconditioned","Mild-to-medarate deconditioning","Moderate-to-severe deconditioning","Guarding","WNL","Unremarkable","Grossly normal","Grossly intact","Functionally intact"}  Muscle strength & Tone: {Blank single:19197::"Inconsistent level of performance when tested","Normal strength (5/5)","Movement possible against some resistance (4/5)","Movement possible against gravity, but not against resistance (3/5)","Movement possible, but not against gravity (5/4)","[id number] flickering, but no  movement (1/5)","No motor contraction (0/5)","Flaccid paralysis","Spastic paralysis","Cogwheel rigidity","Clasp-knife rigidity","Give-away weakness","Deconditioned","Mild-to-medarate deconditioning","Moderate-to-severe deconditioning","Guarding","WNL","Unremarkable","Grossly normal","Grossly intact","Functionally intact"}  Sensory: {Blank single:19197::"Improved","Movement-associated pain","Movement-associated discomfort","Impaired sensorium","Articular pain pattern","Arthropathic arthralgia","Dermatomal pain pattern","Non-dermatomal pain pattern","Musculoskeletal pain pattern","Myotome pain pattern","Neurogenic pain pattern","Neuropathic pain pattern","Referred pain pattern","Visceral pain pattern","Allodynia (Painful response to non-painful stimuli)","Anesthesia (Absence of  sensation)","Anesthesia Dolorosa (Numbness over painful area)","Dysesthesias (Unpleasant sensation to touch)","Hyperalgesia (Increased sensitivity to pain)","Hyperesthesia (Increased sensitivity to touch)","Hyperpathia (Painful, exaggerated response to nociceptive stimuli)","Hypoalgesia (Decreased sensitivity to painful stimuli)","Hypoesthesia/Hypesthesia (Reduced sensation to touch)","Paresthesia (Burning sensation)","Paresthesia (Tingling sensation)","WNL","No anomaly detected","Unimpaired"}  Sensory: {Blank single:19197::"Improved","Movement-associated pain","Movement-associated discomfort","Impaired sensorium","Articular pain pattern","Arthropathic arthralgia","Dermatomal pain pattern","Non-dermatomal pain pattern","Musculoskeletal pain pattern","Myotome pain pattern","Neurogenic pain pattern","Neuropathic pain pattern","Referred pain pattern","Visceral pain pattern","Allodynia (Painful response to non-painful stimuli)","Anesthesia (Absence of sensation)","Anesthesia Dolorosa (Numbness over painful area)","Dysesthesias (Unpleasant sensation to touch)","Hyperalgesia (Increased sensitivity to pain)","Hyperesthesia (Increased sensitivity to touch)","Hyperpathia (Painful, exaggerated response to nociceptive stimuli)","Hypoalgesia (Decreased sensitivity to painful stimuli)","Hypoesthesia/Hypesthesia (Reduced sensation to touch)","Paresthesia (Burning sensation)","Paresthesia (Tingling sensation)","WNL","No anomaly detected","Unimpaired"}  Palpation: {Blank single:19197::"Tender","Complains of area being tender to palpation","Uncomfortable","Positive","Negative","Increased muscle tone","Trigger Point","Muscular Atrophy","Non-tender","No complaints of tenderness","Hyperthermic","Hypothermic","Euthermic","No palpable anomalies"} {Blank single:19197::"Tinel's test","Trousseau's test (3 minute blood pressure cuff test) for hypocalcemia","     "} {Blank single:19197::"for right CTS","for left CTS","for bilateral CTS","      "}  Palpation: {Blank single:19197::"Tender","Complains of area being tender to palpation","Uncomfortable","Positive","Negative","Increased muscle tone","Trigger Point","Muscular Atrophy","Non-tender","No complaints of tenderness","Hyperthermic","Hypothermic","Euthermic","No palpable anomalies"} {Blank single:19197::"Tinel's test","Trousseau's test (3 minute blood pressure cuff test) for hypocalcemia","     "} {Blank single:19197::"for right CTS","for left CTS","for bilateral CTS","     "}  Specialized Test(s): {Blank single:19197::"Tinel's","Phalen's","Tinel's/Phalen's","Deferred"} {Blank single:19197::"(+)","(-)","(+)/(-)","(-)/(+)","      "}  Specialized Test(s): {Blank single:19197::"Tinel's","Phalen's","Tinel's/Phalen's","Deferred"} {Blank single:19197::"(+)","(-)","(+)/(-)","(-)/(+)","      "}   Thoracic Spine Exam  Inspection: {Blank single:19197::"Well healed scar from previous spine surgery detected","prominent thoracic Kyphosis","Significant thoracic kyphosis","Paravertebral muscle atrophy","No masses, redness, or swelling"} Alignment: {Blank single:19197::"Asymmetric","Symmetrical"} Functional ROM: {Blank single:19197::"Improved after treatment","Adequate ROM","Decreased ROM","Diminished ROM","Full ROM","Fused","Grossly intact ROM","Guarding","Limited ROM","Mechanically restricted ROM","Minimal ROM","Pain restricted ROM","Restricted ROM","Zero ROM","ROM appears unrestricted","ROM is within functional limits (WFL)","ROM is within normal limits (WNL)","Unrestricted ROM"} Stability: {Blank single:19197::"Possibly unstable","No instability detected"} Sensory: {Blank single:19197::"Improved","Movement associated pain","Movement associated discomfort","Impaired sensorium","Articular pain pattern","Dermatomal pain pattern","Musculoskeletal pain pattern","Myotome pain pattern","Neurogenic pain pattern","Neuropathic pain pattern","Referred pain pattern","Visceral pain pattern","Allodynia (Painful response  to non-painful stimuli)","Anesthesia (Absence of sensation)","Anesthesia Dolorosa (Numbness over painful area)","Dysesthesias (Unpleasant sensation to touch)","Hyperalgesia (Increased sensitivity to pain)","Hyperesthesia (Increased sensitivity to touch)","Hyperpathia (Painful, exaggerated response to nociceptive stimuli)","Hypoalgesia (Decreased sensitivity to painful stimuli)","Hypoesthesia/Hypesthesia (Reduced sensation to touch)","Paresthesia (Burning sensation)","Paresthesia (Tingling sensation)","WNL","No anomaly detected","Unimpaired"} Muscle strength & Tone: {Blank single:19197::"Tender","Complains of area being tender to palpation","Uncomfortable","Positive","Negative","Increased muscle tone","Trigger Point","Muscular Atrophy","Non-tender","No complaints of tenderness","Hyperthermic","Hypothermic","Euthermic","No palpable anomalies"}  Lumbar Spine Exam  Inspection: {Blank single:19197::"Well healed scar from previous spine surgery detected","Thoraco-lumbar Scoliosis","Lumbar Scoliosis","Paravertebral muscle atrophy","No masses, redness, or swelling"} Alignment: {Blank single:19197::"Scoliosis detected","Levoscoliosis","Dextroscoliosis","Asymmetric","Symmetrical"} Functional ROM: {Blank single:19197::"Improved after treatment","Adequate ROM","Decreased ROM","Diminished ROM","Full ROM","Fused","Grossly intact ROM","Guarding","Limited ROM","Mechanically restricted ROM","Minimal ROM","Pain restricted ROM","Restricted ROM","Zero ROM","ROM appears unrestricted","ROM is within functional limits (WFL)","ROM is within normal limits (WNL)","Unrestricted ROM"}{Blank single:19197::", to the right",", to the left",", bilaterally","     "} Stability: {Blank single:19197::"Possibly unstable","No instability detected"} Muscle strength & Tone: {Blank single:19197::"Increased muscle tone over affected area","Inconsistent level of performance when tested","Functionally intact"} Sensory: {Blank  single:19197::"Improved","Movement-associated pain","Movement-associated discomfort","Impaired sensorium","Articular pain pattern","Dermatomal pain pattern","Musculoskeletal pain pattern","Myotome pain pattern","Neurogenic pain pattern","Neuropathic pain pattern","Referred pain pattern","Visceral pain pattern","Allodynia (Painful response to non-painful stimuli)","Anesthesia (Absence of sensation)","Anesthesia Dolorosa (Numbness over painful area)","Dysesthesias (Unpleasant sensation to touch)","Hyperalgesia (Increased sensitivity to pain)","Hyperesthesia (Increased sensitivity to touch)","Hyperpathia (Painful, exaggerated response to nociceptive stimuli)","Hypoalgesia (Decreased sensitivity to painful stimuli)","Hypoesthesia/Hypesthesia (  Reduced sensation to touch)","Paresthesia (Burning sensation)","Paresthesia (Tingling sensation)","WNL","No anomaly detected","Unimpaired"} Palpation: {Blank single:19197::"Tender","Complains of area being tender to palpation","Uncomfortable","Positive","Negative","Increased muscle tone","Trigger Point","Muscular Atrophy","Non-tender","No complaints of tenderness","Hyperthermic","Hypothermic","Euthermic","No palpable anomalies"} {Blank single:19197::"Right Fist Percussion Test","Left Fist Percussion Test","Bilateral Fist Percussion Test","     "} Provocative Tests: Lumbar Hyperextension and rotation test: {Blank single:19197::"Positive","Negative","Equivocal","Improved after treatment","Unable to perform","Non-contributory","improved","worsened","no change from prior assessment","evaluation deferred today"} {Blank single:19197::"bilaterally for facet joint pain.","on the right for facet joint pain.","on the left for facet joint pain.","due to pain.","due to fusion restriction.","     "} Patrick's Maneuver: {Blank single:19197::"Positive","Negative","Non-diagnostic","Improved after treatment","Unable to perform","worsened","unimproved","Unchanged","no change from prior  assessment","evaluation deferred today"} {Blank single:19197::"for bilateral S-I arthralgia","for right-sided S-I arthralgia","for left-sided S-I arthralgia","     "} {Blank single:19197::"and","     "} {Blank single:19197::"for bilateral hip arthralgia","for right hip arthralgia","for left hip arthralgia","due to pain","due to fusion restriction","     "}  Gait & Posture Assessment  Ambulation: {Blank single:19197::"Limited","Patient ambulates using a cane","Patient ambulates using crutches","Patient ambulates using a walker","Patient ambulates using a wheel chair","Patient came in today in a wheel chair","Incapable of ambulation without assistance","Nonfunctional","Dependent, Level II (constant assistance required)","Dependent, Level I (intermittent assistance required)","Dependent, Supervision required","Independent, Level surfaces only","Independent, Level and Non-level surfaces","Unassisted"} Gait: {Blank single:19197::"Age-related, senile gait pattern","Antalgic","Limited. Using assistive device to ambulate","Very limited, using assistive device to ambulate","Antalgic gait (limping)","Apraxia (Inability to execute a movement, upon request, without loss of motor or sensory function)","Ataxia (Poor voluntary coordination)","Ataxia (Appendicular)","Ataxia (Cerebellar)(Irregular, uncoordinated movement with inability to balance on one leg or perform tandem gait test)","Ataxia (Friedreich's)","Ataxia (Sensory) (Unsteady "stomping" gait with heavy heel strikes. Postural instability worsened by closing eyes.)","Ataxia (Truncal)("Drunken sailor" gait)","Ataxia (Vestibular)","Awkward","Cautious","Charlie-Chaplin (due to tibial torsion)","Choreiform (Irregular, jerky, involuntary movements)(Hyperkinetic)","Circumduction gait (due to hemiplegia)","Clumsy","Compensatory","Dystaxia (Mild degree of ataxia)","Dystonic","Frontal gait (Apraxia)","Hemiataxia (Ataxia limited to one side)","Hemiparetic  (Post-stroke)(Ipsilateral arm flexion and tiptoe/lateral foot walk)","High stepping gait (foot drop)","Modified gait pattern (slower gait speed, wider stride width, and longer stance duration) associated with morbid obesity","Paraparetic (Stiffness, extension, adduction and scissoring of both legs)","Parkinsonian","Psychogenic","Scissor gait (cerebral palsy)","Shuffling gait","Staggering","Steppage","Stiff hip gait (hip ankylosis)","Stumbling","Trendelenburg (unstable hip)","Unaffected","Uneven","Waddling (Hip pathology)","Functionally WNL","Grossly intact","Improved after treatment","Relatively normal for age and body habitus"} Posture: {Blank single:19197::"Antalgic","Difficulty standing up straight, due to pain","Positive Romberg's test (Sensory Ataxia)(Worsening of balance and pointing with eyes closed)","Painful","Recombent","Relaxed","Tense","Difficulty with positional changes","Sway back","Lumbar lordosis","Thoracic kyphosis","Kyphosis-lordosis","Flat back","Forward head","Neutral Spine","Slouching","Drooping","Rigid","Poor","Good","WNL"}   Lower Extremity Exam    Side: Right lower extremity  Side: Left lower extremity  Inspection: {Blank single:19197::"Below knee amputation (BKA)","Above knee amputation (AKA)","Contracture","Atrophy","Dystrophy","No gross anomalies detected","Edema","Pitting edema","Venous stasis edema","Positive color changes","Some redness observed","Increased temperature","Acrocyanosis","Normal skin color, temperature, and hair growth. No peripheral edema or cyanosis","No masses, redness, swelling, or asymmetry. No contractures"}  Inspection: {Blank single:19197::"Below knee amputation (BKA)","Above knee amputation (AKA)","Contracture","Atrophy","Dystrophy","No gross anomalies detected","Edema","Pitting edema","Venous stasis edema","Positive color changes","Some redness observed","Increased temperature","Acrocyanosis","Normal skin color, temperature, and hair growth. No peripheral  edema or cyanosis","No masses, redness, swelling, or asymmetry. No contractures"}  Functional ROM: {Blank single:19197::"Improved after treatment","Impaired ROM","Adequate ROM","Decreased ROM","Diminished ROM","Full ROM","Fused","Grossly intact ROM","Guarding","Limited ROM","Mechanically restricted ROM","Minimal ROM","Pain restricted ROM","Restricted ROM","Zero ROM","ROM appears unrestricted","ROM is within functional limits (WFL)","ROM is within normal limits (WNL)","Unrestricted ROM"} {Blank single:19197::"for hip joint","for knee joint","for hip and knee joints","for all joints of the lower extremity","       "}  Functional ROM: {Blank single:19197::"Improved after treatment","Impaired ROM","Adequate ROM","Decreased ROM","Diminished ROM","Full ROM","Fused","Grossly intact ROM","Guarding","Limited ROM","Mechanically restricted ROM","Minimal ROM","Pain restricted ROM","Restricted ROM","Zero ROM","ROM appears unrestricted","ROM is within functional limits (WFL)","ROM is within normal limits (WNL)","Unrestricted ROM"} {Blank single:19197::"for hip joint","for knee joint","for hip and knee joints","for all joints of the lower extremity","       "}  Muscle  strength & Tone: {Blank single:19197::"Able to Toe-walk & Heel-walk without problems","Give-away weakness","Deconditioned","Mild-to-moderate deconditioning","Moderate-to-severe deconditioning","Guarding","Inconsistent level of performance when tested","Normal strength (5/5)","Movement possible against some resistance (4/5)","Movement possible against gravity, but not against resistance (3/5)","Movement possible, but not against gravity (9/9)","IPJASN flickering, but no movement (1/5)","No motor contraction (0/5)","Flaccid paralysis","Spastic paralysis","Cogwheel rigidity","Clasp-knife rigidity","WNL","Unremarkable","Grossly normal","Grossly intact","Functionally intact"}  Muscle strength & Tone: {Blank single:19197::"Able to Toe-walk & Heel-walk without  problems","Give-away weakness","Deconditioned","Mild-to-moderate deconditioning","Moderate-to-severe deconditioning","Guarding","Inconsistent level of performance when tested","Normal strength (5/5)","Movement possible against some resistance (4/5)","Movement possible against gravity, but not against resistance (3/5)","Movement possible, but not against gravity (0/5)","LZJQBH flickering, but no movement (1/5)","No motor contraction (0/5)","Flaccid paralysis","Spastic paralysis","Cogwheel rigidity","Clasp-knife rigidity","WNL","Unremarkable","Grossly normal","Grossly intact","Functionally intact"}  Sensory: {Blank single:19197::"Improved","Movement-associated pain","Movement-associated discomfort","Impaired sensorium","Articular pain pattern","Arthropathic arthralgia","Dermatomal pain pattern","Non-dermatomal pain pattern","Musculoskeletal pain pattern","Myotome pain pattern","Neurogenic pain pattern","Neuropathic pain pattern","Referred pain pattern","Visceral pain pattern","Allodynia (Painful response to non-painful stimuli)","Anesthesia (Absence of sensation)","Anesthesia Dolorosa (Numbness over painful area)","Dysesthesias (Unpleasant sensation to touch)","Hyperalgesia (Increased sensitivity to pain)","Hyperesthesia (Increased sensitivity to touch)","Hyperpathia (Painful, exaggerated response to nociceptive stimuli)","Hypoalgesia (Decreased sensitivity to painful stimuli)","Hypoesthesia/Hypesthesia (Reduced sensation to touch)","Paresthesia (Burning sensation)","Paresthesia (Tingling sensation)","WNL","No anomaly detected","Unimpaired"}  Sensory: {Blank single:19197::"Improved","Movement-associated pain","Movement-associated discomfort","Impaired sensorium","Articular pain pattern","Arthropathic arthralgia","Dermatomal pain pattern","Non-dermatomal pain pattern","Musculoskeletal pain pattern","Myotome pain pattern","Neurogenic pain pattern","Neuropathic pain pattern","Referred pain pattern","Visceral pain  pattern","Allodynia (Painful response to non-painful stimuli)","Anesthesia (Absence of sensation)","Anesthesia Dolorosa (Numbness over painful area)","Dysesthesias (Unpleasant sensation to touch)","Hyperalgesia (Increased sensitivity to pain)","Hyperesthesia (Increased sensitivity to touch)","Hyperpathia (Painful, exaggerated response to nociceptive stimuli)","Hypoalgesia (Decreased sensitivity to painful stimuli)","Hypoesthesia/Hypesthesia (Reduced sensation to touch)","Paresthesia (Burning sensation)","Paresthesia (Tingling sensation)","WNL","No anomaly detected","Unimpaired"}  Palpation: {Blank single:19197::"Tender","Complains of area being tender to palpation","Uncomfortable","Positive","Negative","Increased muscle tone","Trigger Point","Muscular Atrophy","Non-tender","No complaints of tenderness","Hyperthermic","Hypothermic","Euthermic","No palpable anomalies"}  Palpation: {Blank single:19197::"Tender","Complains of area being tender to palpation","Uncomfortable","Positive","Negative","Increased muscle tone","Trigger Point","Muscular Atrophy","Non-tender","No complaints of tenderness","Hyperthermic","Hypothermic","Euthermic","No palpable anomalies"}   Assessment  Primary Diagnosis & Pertinent Problem List: There were no encounter diagnoses.  Visit Diagnosis: No diagnosis found. Plan of Care  Initial treatment plan:  {Blank single:19197::"Please be advised that as per protocol, today's visit has been an evaluation only. We have not taken over the patient's controlled substance management."}  Problem-specific plan: No problem-specific Assessment & Plan notes found for this encounter.  Ordered Lab-work, Procedure(s), Referral(s), & Consult(s): No orders of the defined types were placed in this encounter.  Pharmacotherapy: Medications ordered:  No orders of the defined types were placed in this encounter.  Medications administered during this visit: [name] had no medications  administered during this visit.   Pharmacotherapy under consideration:  Opioid Analgesics: The patient was informed that there is no guarantee that she would be a candidate for opioid analgesics. The decision will be made following CDC guidelines. This decision will be based on the results of diagnostic studies, as well as Ms. Herron's risk profile.  Membrane stabilizer: {Blank single:19197::"Not indicated","Medically contraindicated","Tried and failed","To be determined at a later time"} Muscle relaxant: {Blank single:19197::"Not indicated","Medically contraindicated","Tried and failed","To be determined at a later time"} NSAID: {Blank single:19197::"Not indicated","Medically contraindicated","Tried and failed","To be determined at a later time"} Other analgesic(s): {Blank single:19197::"Not indicated","Medically contraindicated","Tried and failed","To be determined at a later time"}   Interventional therapies under consideration: [name] was informed that there is no guarantee that she would be a candidate for interventional therapies. The decision will be based on the results of diagnostic studies, as well as Ms. Herron's risk profile.  Possible procedure(s): ***   Provider-requested follow-up: No Follow-up on file.  Future Appointments Date Time Provider [place]  08/09/2016 10:15 AM Vevelyn Francois, NP [facility] None    Primary Care Physician: Dema Severin,  Caren Griffins, MD Location: [facility] Note by:  Date: 08/09/2016; Time: 1:33 PM  Pain Score Disclaimer: We use the NRS-11 scale. This is a self-reported, subjective measurement of pain severity with only modest accuracy. It is used primarily to identify changes within a particular patient. It must be understood that outpatient pain scales are significantly less accurate that those used for research, where they can be applied under ideal controlled circumstances with minimal exposure to variables. In  reality, the score is likely to be a combination of pain intensity and pain affect, where pain affect describes the degree of emotional arousal or changes in action readiness caused by the sensory experience of pain. Factors such as social and work situation, setting, emotional state, anxiety levels, expectation, and prior pain experience may influence pain perception and show large inter-individual differences that may also be affected by time variables.  Patient instructions provided during this appointment: There are no Patient Instructions on file for this visit.

## 2016-08-09 ENCOUNTER — Ambulatory Visit: Payer: BLUE CROSS/BLUE SHIELD | Admitting: Nurse Practitioner

## 2016-08-16 ENCOUNTER — Telehealth: Payer: Self-pay | Admitting: Psychology

## 2016-08-16 NOTE — Progress Notes (Signed)
Called patient back and informed her she could pick up records from our office. She thanked me, but she had already gotten what she needed from Medical Records so she does not need them from us.

## 2016-08-16 NOTE — Telephone Encounter (Signed)
-----   [facility] sent at 08/14/2016 10:51 AM EDT ----- PT called and wanted a copy of her test and asked if she can pick it up/Dawn CB#[phone]

## 2016-08-20 ENCOUNTER — Ambulatory Visit: Payer: BLUE CROSS/BLUE SHIELD | Admitting: Nurse Practitioner

## 2016-08-22 ENCOUNTER — Ambulatory Visit: Payer: BLUE CROSS/BLUE SHIELD | Attending: Nurse Practitioner | Admitting: Nurse Practitioner

## 2016-08-22 DIAGNOSIS — Z79891 Long term (current) use of opiate analgesic: Secondary | ICD-10-CM | POA: Insufficient documentation

## 2016-08-22 DIAGNOSIS — G894 Chronic pain syndrome: Secondary | ICD-10-CM | POA: Insufficient documentation

## 2016-08-22 NOTE — Progress Notes (Deleted)
Patient's Name: Tabitha Bennett  MRN: [id number]  Referring Provider: Meade Maw, MD  DOB: 07/04/1965  PCP: Harlan Stains, MD  DOS: 08/22/2016  Note by: Dionisio David NP  Service setting: Ambulatory outpatient  Specialty: Interventional Pain Management  Location: [facility] (AMB) Pain Management Facility    Patient type: New Patient    Primary Reason(s) for Visit: Initial Patient Evaluation CC: No chief complaint on file.  HPI  [name] is a 51 y.o. year old, female patient, who comes today for an initial evaluation. She has Abnormal MRI; Encounter for counseling; History of skin cancer; Chronic pain syndrome; and Encounter for long-term opiate analgesic use on her problem list.. Her primarily concern today is the No chief complaint on file.  Pain Assessment: Self-Reported Pain Score:  /10             Reported level is compatible with observation.          Onset and Duration: {Hx; Onset and Duration:210120511} Cause of pain: {Hx; Cause:210120521} Severity: {Pain Severity:210120502} Timing: {Symptoms; Timing:210120501} Aggravating Factors: {Causes; Aggravating pain factors:210120507} Alleviating Factors: {Causes; Alleviating Factors:210120500} Associated Problems: {Hx; Associated problems:210120515} Quality of Pain: {Hx; Symptom quality or Descriptor:210120531} Previous Examinations or Tests: {Hx; Previous examinations or test:210120529} Previous Treatments: {Hx; Previous Treatment:210120503}  The patient comes into the clinics today for the first time for a chronic pain management evaluation. ***  Today I took the time to provide the patient with information regarding this pain practice. The patient was informed that the practice is divided into two sections: an interventional pain management section, as well as a completely separate and distinct medication management section. I explained that there are procedure days for interventional therapies, and evaluation days for  follow-ups and medication management. Because of the amount of documentation required during both, they are kept separated. This means that there is the possibility that *** may be scheduled for a procedure on one day, and medication management the next. I have also informed *** that because of staffing and facility limitations, this practice will no longer take patients for medication management only. To illustrate the reasons for this, I gave the patient the example of surgeons, and how inappropriate it would be to refer a patient to his/her care, just to write for the post-surgical antibiotics on a surgery done by a different surgeon.   Because interventional pain management is part of the board-certified specialty for the doctors, the patient was informed that joining this practice means that they are open to any and all interventional therapies. I made it clear that this does not mean that they will be forced to have any procedures done. What this means is that I believe interventional therapies to be essential part of the diagnosis and proper management of chronic pain conditions. Therefore, patients not interested in these interventional alternatives will be better served under the care of a different practitioner.  The patient was also made aware of my Comprehensive Pain Management Safety Guidelines where by joining this practice, they limit all of their nerve blocks and joint injections to those done by our practice, for as long as we are retained to manage their care. Historic Controlled Substance Pharmacotherapy Review  PMP and historical list of controlled substances: *** Highest opioid analgesic regimen found: *** Most recent opioid analgesic: *** Current opioid analgesics: *** Highest recorded MME/day: *** mg/day MME/day: *** mg/day Medications: The patient did not bring the medication(s) to the appointment, as requested in our "New Patient Package" Pharmacodynamics: Desired  effects: Analgesia: The patient reports >50% benefit. Reported improvement in function: The patient reports medication allows her to accomplish basic ADLs. Clinically meaningful improvement in function (CMIF): Sustained CMIF goals met Perceived effectiveness: Described as relatively effective, allowing for increase in activities of daily living (ADL) Undesirable effects: Side-effects or Adverse reactions: None reported Historical Monitoring: The patient  reports that she does not use drugs. List of all UDS Test(s): No results found for: MDMA, COCAINSCRNUR, PCPSCRNUR, PCPQUANT, CANNABQUANT, THCU, [place] List of all Serum Drug Screening Test(s):  No results found for: AMPHSCRSER, BARBSCRSER, BENZOSCRSER, COCAINSCRSER, PCPSCRSER, PCPQUANT, THCSCRSER, CANNABQUANT, OPIATESCRSER, OXYSCRSER, PROPOXSCRSER Historical Background Evaluation: [place] PDMP: Six (6) year initial data search conducted.             [place] Department of public safety, offender search: Editor, commissioning Information) Non-contributory Risk Assessment Profile: Aberrant behavior: None observed or detected today Risk factors for fatal opioid overdose: None identified today Fatal overdose hazard ratio (HR): Calculation deferred Non-fatal overdose hazard ratio (HR): Calculation deferred Risk of opioid abuse or dependence: 0.7-3.0% with doses ? 36 MME/day and 6.1-26% with doses ? 120 MME/day. Substance use disorder (SUD) risk level: Pending results of Medical Psychology Evaluation for SUD Opioid risk tool (ORT) (Total Score):    ORT Scoring interpretation table:  Score <3 = Low Risk for SUD  Score between 4-7 = Moderate Risk for SUD  Score >8 = High Risk for Opioid Abuse   PHQ-2 Depression Scale:  Total score:    PHQ-2 Scoring interpretation table: (Score and probability of major depressive disorder)  Score 0 = No depression  Score 1 = 15.4% Probability  Score 2 = 21.1% Probability  Score 3 = 38.4% Probability  Score 4 = 45.5% Probability   Score 5 = 56.4% Probability  Score 6 = 78.6% Probability   PHQ-9 Depression Scale:  Total score:    PHQ-9 Scoring interpretation table:  Score 0-4 = No depression  Score 5-9 = Mild depression  Score 10-14 = Moderate depression  Score 15-19 = Moderately severe depression  Score 20-27 = Severe depression (2.4 times higher risk of SUD and 2.89 times higher risk of overuse)   Pharmacologic Plan: Pending ordered tests and/or consults  Meds  The patient has a current medication list which includes the following prescription(s): citalopram, estradiol, lamotrigine, lisdexamfetamine, and progesterone.  Current Outpatient Prescriptions on File Prior to Visit  Medication Sig  . citalopram (CELEXA) 20 MG tablet Take 30 mg by mouth daily.  Marland Kitchen estradiol (ESTRACE) 0.5 MG tablet Take 0.5 mg by mouth at bedtime.   . lamoTRIgine (LAMICTAL) 100 MG tablet Take 100-200 mg by mouth See admin instructions. Take 280m by mouth in the morning, and 105mby mouth at bedtime  . lisdexamfetamine (VYVANSE) 30 MG capsule Take 30 mg by mouth daily.  . progesterone (PROMETRIUM) 200 MG capsule Take 200 mg by mouth at bedtime.   No current facility-administered medications on file prior to visit.    Imaging Review  Cervical Imaging: Cervical MR wo contrast:  Results for orders placed during the hospital encounter of 04/22/16  MR Cervical Spine Wo Contrast   Narrative CLINICAL DATA:  [age]/o  F; lower extremity weakness and ataxia.  EXAM: MRI CERVICAL, THORACIC AND LUMBAR SPINE WITHOUT CONTRAST  TECHNIQUE: Multiplanar and multiecho pulse sequences of the cervical spine, to include the craniocervical junction and cervicothoracic junction, and thoracic and lumbar spine, were obtained without intravenous contrast.  COMPARISON:  Concurrent MRI of the brain. 11/24/2010 lumbar MRI. 11/25/2010 thoracic  MRI.  FINDINGS: MRI CERVICAL SPINE FINDINGS  Alignment: Mild reversal of cervical lordosis and grade  1 degenerative anterolisthesis at C3-4.  Vertebrae: No fracture, evidence of discitis, or bone lesion.  Cord: No abnormal cord signal.  Posterior Fossa, vertebral arteries, paraspinal tissues: Negative.  Disc levels:  C2-3: No significant disc displacement, foraminal narrowing, or canal stenosis. Mild facet hypertrophy.  C3-4: Anterolisthesis with small uncovered disc bulge and mild facet hypertrophy. Mild bilateral foraminal narrowing. No significant canal stenosis.  C4-5: No significant disc displacement, foraminal narrowing, or canal stenosis.  C5-6: Small disc osteophyte complex with uncovertebral and facet hypertrophy. Moderate left and mild right foraminal narrowing. Mild canal stenosis.  C6-7: Disc osteophyte complex eccentric to the right and right-sided uncovertebral hypertrophy. Moderate right foraminal narrowing. No significant left foraminal narrowing or canal stenosis.  C7-T1: No significant disc displacement, foraminal narrowing, or canal stenosis.  MRI THORACIC SPINE FINDINGS  Alignment:  Physiologic.  Vertebrae: Stable T7 inferior endplate Schmorl's node. No evidence of acute discitis, fracture, or bone lesion.  Cord:  Normal signal and morphology.  Paraspinal and other soft tissues: Negative.  Disc levels:  Tiny T8-9 and T11-12 right subarticular disc protrusion without cord or nerve root contact are new.  MRI LUMBAR SPINE FINDINGS  Segmentation:  Standard.  Alignment:  Physiologic.  Vertebrae:  No fracture, evidence of discitis, or bone lesion.  Conus medullaris: Extends to the L1 level and appears normal.  Paraspinal and other soft tissues: Negative.  Disc levels:  L1-2: No significant disc displacement, foraminal narrowing, or canal stenosis.  L2-3: Small disc bulge eccentric to the left. No significant foraminal narrowing or canal stenosis.  L3-4: Increased moderate disc bulge eccentric to the left with moderate facet and  ligamentum flavum hypertrophy. Mild bilateral foraminal and lateral recess narrowing. No significant canal stenosis.  L4-5: Increased moderate disc bulge with small central protrusion and annular fissure combined with moderate facet and ligamentum flavum hypertrophy. Mild bilateral foraminal and lateral recess narrowing with mild canal stenosis.  L5-S1: Minimal disc bulge and mild facet hypertrophy. No significant foraminal narrowing or canal stenosis.  IMPRESSION: 1. No acute osseous abnormality or abnormal cord signal of the cervical, thoracic, and lumbar spine. 2. No high-grade canal stenosis or foraminal narrowing of the cervical, thoracic, and lumbar spine. 3. Multilevel cervical spondylosis greatest at the C5-6 level where there is moderate left mild right foraminal narrowing and mild canal stenosis. 4. New tiny T8-9 and T11-12 right subarticular disc protrusions without cord or nerve root contact. 5. Progression of lumbar spondylosis at L3-4 where there is mild bilateral foraminal narrowing and at L4-5 where there is mild foraminal narrowing and mild canal stenosis.   Electronically Signed   By: Kristine Garbe M.D.   On: 04/22/2016 23:03    Cervical MR wo contrast:  Cervical MR w/wo contrast:  Cervical MR w contrast:  Cervical CT wo contrast:  Cervical CT w/wo contrast:  Cervical CT w/wo contrast:  Cervical CT w contrast:  Cervical CT outside:  Cervical DG 1 view:  Cervical DG 2-3 views:  Cervical DG F/E views:  Cervical DG 2-3 clearing views:  Cervical DG Bending/F/E views:  Cervical DG complete:  Cervical DG Myelogram views:  Cervical DG Myelogram views:  Cervical Discogram views:   Shoulder Imaging: Shoulder-R MR w contrast:  Shoulder-L MR w contrast:  Shoulder-R MR w/wo contrast:  Shoulder-L MR w/wo contrast:  Shoulder-R MR wo contrast:  Shoulder-L MR wo contrast:  Shoulder-R CT w contrast:  Shoulder-L CT  w contrast:  Shoulder-R CT w/wo  contrast:  Shoulder-L CT w/wo contrast:  Shoulder-R CT wo contrast:  Shoulder-L CT wo contrast:  Shoulder-R DG Arthrogram:  Shoulder-L DG Arthrogram:  Shoulder-R DG 1 view:  Shoulder-L DG 1 view:  Shoulder-R DG:  Shoulder-L DG:   Thoracic Imaging: Thoracic MR wo contrast:  Results for orders placed during the hospital encounter of 04/22/16  MR THORACIC SPINE WO CONTRAST   Narrative CLINICAL DATA:  51 y/o  F; lower extremity weakness and ataxia.  EXAM: MRI CERVICAL, THORACIC AND LUMBAR SPINE WITHOUT CONTRAST  TECHNIQUE: Multiplanar and multiecho pulse sequences of the cervical spine, to include the craniocervical junction and cervicothoracic junction, and thoracic and lumbar spine, were obtained without intravenous contrast.  COMPARISON:  Concurrent MRI of the brain. 11/24/2010 lumbar MRI. 11/25/2010 thoracic MRI.  FINDINGS: MRI CERVICAL SPINE FINDINGS  Alignment: Mild reversal of cervical lordosis and grade 1 degenerative anterolisthesis at C3-4.  Vertebrae: No fracture, evidence of discitis, or bone lesion.  Cord: No abnormal cord signal.  Posterior Fossa, vertebral arteries, paraspinal tissues: Negative.  Disc levels:  C2-3: No significant disc displacement, foraminal narrowing, or canal stenosis. Mild facet hypertrophy.  C3-4: Anterolisthesis with small uncovered disc bulge and mild facet hypertrophy. Mild bilateral foraminal narrowing. No significant canal stenosis.  C4-5: No significant disc displacement, foraminal narrowing, or canal stenosis.  C5-6: Small disc osteophyte complex with uncovertebral and facet hypertrophy. Moderate left and mild right foraminal narrowing. Mild canal stenosis.  C6-7: Disc osteophyte complex eccentric to the right and right-sided uncovertebral hypertrophy. Moderate right foraminal narrowing. No significant left foraminal narrowing or canal stenosis.  C7-T1: No significant disc displacement, foraminal narrowing, or canal  stenosis.  MRI THORACIC SPINE FINDINGS  Alignment:  Physiologic.  Vertebrae: Stable T7 inferior endplate Schmorl's node. No evidence of acute discitis, fracture, or bone lesion.  Cord:  Normal signal and morphology.  Paraspinal and other soft tissues: Negative.  Disc levels:  Tiny T8-9 and T11-12 right subarticular disc protrusion without cord or nerve root contact are new.  MRI LUMBAR SPINE FINDINGS  Segmentation:  Standard.  Alignment:  Physiologic.  Vertebrae:  No fracture, evidence of discitis, or bone lesion.  Conus medullaris: Extends to the L1 level and appears normal.  Paraspinal and other soft tissues: Negative.  Disc levels:  L1-2: No significant disc displacement, foraminal narrowing, or canal stenosis.  L2-3: Small disc bulge eccentric to the left. No significant foraminal narrowing or canal stenosis.  L3-4: Increased moderate disc bulge eccentric to the left with moderate facet and ligamentum flavum hypertrophy. Mild bilateral foraminal and lateral recess narrowing. No significant canal stenosis.  L4-5: Increased moderate disc bulge with small central protrusion and annular fissure combined with moderate facet and ligamentum flavum hypertrophy. Mild bilateral foraminal and lateral recess narrowing with mild canal stenosis.  L5-S1: Minimal disc bulge and mild facet hypertrophy. No significant foraminal narrowing or canal stenosis.  IMPRESSION: 1. No acute osseous abnormality or abnormal cord signal of the cervical, thoracic, and lumbar spine. 2. No high-grade canal stenosis or foraminal narrowing of the cervical, thoracic, and lumbar spine. 3. Multilevel cervical spondylosis greatest at the C5-6 level where there is moderate left mild right foraminal narrowing and mild canal stenosis. 4. New tiny T8-9 and T11-12 right subarticular disc protrusions without cord or nerve root contact. 5. Progression of lumbar spondylosis at L3-4 where there is  mild bilateral foraminal narrowing and at L4-5 where there is mild foraminal narrowing and mild canal stenosis.   Electronically  Signed   By: Kristine Garbe M.D.   On: 04/22/2016 23:03    Thoracic MR wo contrast:  Thoracic MR w/wo contrast:  Results for orders placed during the hospital encounter of 11/24/10  MR Thoracic Spine W Wo Contrast   Narrative *RADIOLOGY REPORT*  Clinical Data: 51 year old female with unexplained weakness.  MRI THORACIC SPINE WITHOUT AND WITH CONTRAST  Technique:  Multiplanar and multiecho pulse sequences of the thoracic spine were obtained without and with intravenous contrast.  Contrast: 57m MULTIHANCE GADOBENATE DIMEGLUMINE 529 MG/ML IV SOLN In conjunction with brain MRI which is reported separately.  Comparison: Lumbar MRI 11/24/2010.  Findings: Limited sagittal imaging of the cervical spine is suggestive of multi level mild lower cervical disc bulges.  No cervical spinal stenosis suspected.  Visualized paraspinal soft tissues are within normal limits. Negative visualized thoracic viscera except for mild dependent atelectasis.  Negative visualized upper abdominal viscera.  Mild wedging and chronic inferior endplate deformity at T7. Otherwise normal thoracic vertebral height, alignment, and marrow signal. No marrow edema or evidence of acute osseous abnormality.  Spinal cord signal is within normal limits at all visualized levels.  Normal conus medullaris re-identified at L1.  No abnormal intradural enhancement.  No abnormal enhancement identified elsewhere.  Occasional mild thoracic disc bulges.  No thoracic spinal or neural foraminal stenosis.  IMPRESSION:  1.  Negative thoracic MRI except for mild chronic T7 inferior endplate deformity. 2.  Brain MRI reported separately.  Original Report Authenticated By: HRandall An M.D.   Thoracic MR w contrast:  Thoracic CT wo contrast:  Thoracic CT w/wo contrast:  Thoracic  CT w/wo contrast:  Thoracic CT w contrast:  Thoracic DG 2-3 views:  Thoracic DG 4 views:  Thoracic DG:  Thoracic DG w/swimmers view:  Thoracic DG Myelogram views:  Thoracic DG Myelogram views:   Lumbosacral Imaging: Lumbar MR wo contrast:  Results for orders placed during the hospital encounter of 04/22/16  MR LUMBAR SPINE WO CONTRAST   Narrative CLINICAL DATA:  51y/o  F; lower extremity weakness and ataxia.  EXAM: MRI CERVICAL, THORACIC AND LUMBAR SPINE WITHOUT CONTRAST  TECHNIQUE: Multiplanar and multiecho pulse sequences of the cervical spine, to include the craniocervical junction and cervicothoracic junction, and thoracic and lumbar spine, were obtained without intravenous contrast.  COMPARISON:  Concurrent MRI of the brain. 11/24/2010 lumbar MRI. 11/25/2010 thoracic MRI.  FINDINGS: MRI CERVICAL SPINE FINDINGS  Alignment: Mild reversal of cervical lordosis and grade 1 degenerative anterolisthesis at C3-4.  Vertebrae: No fracture, evidence of discitis, or bone lesion.  Cord: No abnormal cord signal.  Posterior Fossa, vertebral arteries, paraspinal tissues: Negative.  Disc levels:  C2-3: No significant disc displacement, foraminal narrowing, or canal stenosis. Mild facet hypertrophy.  C3-4: Anterolisthesis with small uncovered disc bulge and mild facet hypertrophy. Mild bilateral foraminal narrowing. No significant canal stenosis.  C4-5: No significant disc displacement, foraminal narrowing, or canal stenosis.  C5-6: Small disc osteophyte complex with uncovertebral and facet hypertrophy. Moderate left and mild right foraminal narrowing. Mild canal stenosis.  C6-7: Disc osteophyte complex eccentric to the right and right-sided uncovertebral hypertrophy. Moderate right foraminal narrowing. No significant left foraminal narrowing or canal stenosis.  C7-T1: No significant disc displacement, foraminal narrowing, or canal stenosis.  MRI THORACIC SPINE  FINDINGS  Alignment:  Physiologic.  Vertebrae: Stable T7 inferior endplate Schmorl's node. No evidence of acute discitis, fracture, or bone lesion.  Cord:  Normal signal and morphology.  Paraspinal and other soft tissues: Negative.  Disc levels:  Tiny T8-9 and T11-12 right subarticular disc protrusion without cord or nerve root contact are new.  MRI LUMBAR SPINE FINDINGS  Segmentation:  Standard.  Alignment:  Physiologic.  Vertebrae:  No fracture, evidence of discitis, or bone lesion.  Conus medullaris: Extends to the L1 level and appears normal.  Paraspinal and other soft tissues: Negative.  Disc levels:  L1-2: No significant disc displacement, foraminal narrowing, or canal stenosis.  L2-3: Small disc bulge eccentric to the left. No significant foraminal narrowing or canal stenosis.  L3-4: Increased moderate disc bulge eccentric to the left with moderate facet and ligamentum flavum hypertrophy. Mild bilateral foraminal and lateral recess narrowing. No significant canal stenosis.  L4-5: Increased moderate disc bulge with small central protrusion and annular fissure combined with moderate facet and ligamentum flavum hypertrophy. Mild bilateral foraminal and lateral recess narrowing with mild canal stenosis.  L5-S1: Minimal disc bulge and mild facet hypertrophy. No significant foraminal narrowing or canal stenosis.  IMPRESSION: 1. No acute osseous abnormality or abnormal cord signal of the cervical, thoracic, and lumbar spine. 2. No high-grade canal stenosis or foraminal narrowing of the cervical, thoracic, and lumbar spine. 3. Multilevel cervical spondylosis greatest at the C5-6 level where there is moderate left mild right foraminal narrowing and mild canal stenosis. 4. New tiny T8-9 and T11-12 right subarticular disc protrusions without cord or nerve root contact. 5. Progression of lumbar spondylosis at L3-4 where there is mild bilateral foraminal  narrowing and at L4-5 where there is mild foraminal narrowing and mild canal stenosis.   Electronically Signed   By: Kristine Garbe M.D.   On: 04/22/2016 23:03    Lumbar MR wo contrast:  Lumbar MR w/wo contrast:  Lumbar MR w contrast:  Lumbar CT wo contrast:  Lumbar CT w/wo contrast:  Lumbar CT w/wo contrast:  Lumbar CT w contrast:  Lumbar DG 1V:  Lumbar DG 1V (Clearing):  Lumbar DG 2-3V (Clearing):  Lumbar DG 2-3 views:  Lumbar DG (Complete) 4+V:  Lumbar DG F/E views:  Lumbar DG Bending views:  Lumbar DG Myelogram views:  Lumbar DG Myelogram:  Lumbar DG Myelogram:  Lumbar DG Myelogram:  Lumbar DG Myelogram Lumbosacral:  Lumbar DG Diskogram views:  Lumbar DG Diskogram views:  Lumbar DG Epidurogram OP:  Lumbar DG Epidurogram IP:   Sacroiliac Joint Imaging: Sacroiliac Joint DG:  Sacroiliac Joint MR w/wo contrast:  Sacroiliac Joint MR wo contrast:   Spine Imaging: Whole Spine DG Myelogram views:  Whole Spine MR Mets screen:  Whole Spine MR Mets screen:  Whole Spine MR w/wo:  MRA Spinal Canal w/ cm:  MRA Spinal Canal wo/ cm:  MRA Spinal Canal w/wo cm:  Spine Outside MR Films:  Spine Outside CT Films:  CT-Guided Biopsy:  CT-Guided Needle Placement:  DG Spine outside:  IR Spine outside:  NM Spine outside:   Hip Imaging: Hip-R MR w contrast:  Hip-L MR w contrast:  Hip-R MR w/wo contrast:  Hip-L MR w/wo contrast:  Hip-R MR wo contrast:  Hip-L MR wo contrast:  Hip-R CT w contrast:  Hip-L CT w contrast:  Hip-R CT w/wo contrast:  Hip-L CT w/wo contrast:  Hip-R CT wo contrast:  Hip-L CT wo contrast:  Hip-R DG 2-3 views:  Hip-L DG 2-3 views:  Hip-R DG Arthrogram:  Hip-L DG Arthrogram:  Hip-B DG Bilateral:   Knee Imaging: Knee-R MR w contrast:  Knee-L MR w contrast:  Knee-R MR w/wo contrast:  Knee-L MR w/wo contrast:  Knee-R MR wo contrast:  Knee-L  MR wo contrast:  Knee-R CT w contrast:  Knee-L CT w contrast:  Knee-R CT w/wo contrast:   Knee-L CT w/wo contrast:  Knee-R CT wo contrast:  Knee-L CT wo contrast:  Knee-R DG 1-2 views:  Knee-L DG 1-2 views:  Knee-R DG 3 views:  Knee-L DG 3 views:  Knee-R DG 4 views:  Knee-L DG 4 views:  Knee-R DG Arthrogram:  Knee-L DG Arthrogram:   Note: Available results from prior imaging studies were reviewed.        ROS  Cardiovascular History: {Hx; Cardiovascular History:210120525} Pulmonary or Respiratory History: {Hx; Pumonary and/or Respiratory History:210120523} Neurological History: {Hx; Neurological:210120504} Review of Past Neurological Studies:  Results for orders placed or performed in visit on 05/08/16  [place] [address], [place], [place] [address] [phone]  NEUROIMAGING REPORT   STUDY DATE: 05/07/2016 PATIENT NAME: CANIYA TAGLE DOB: [birth year] MRN: [id number]  ORDERING CLINICIAN:  DrSethi CLINICAL HISTORY:  51 year patient with abnormal MRI brain COMPARISON FILMS: MRI Brain wo 04/22/2016 EXAM: MRI Brain with only TECHNIQUE: post contrast sagittal, T1 and coronal images were obtained  through the brain  CONTRAST:  Iv gadavist IMAGING SITE: [facility]  FINDINGS:  Limited postcontrast images were obtained only. No abnormal enhancing  lesion is noted. The brain parenchyma appears unremarkable. The orbits,  paranasal sinuses and visualized portion of the cervical spine show no  abnormalities.      Impression    Normal limited postcontrast MRI scan of the brain.  INTERPRETING PHYSICIAN:  Antony Contras, MD Certified in  Neuroimaging by [employer] and [place] [facility]    Results for orders placed or performed during the hospital encounter of 04/22/16  MR Brain Wo Contrast   Narrative   CLINICAL DATA:  51 y/o F; weakness, ataxia, bilateral leg weakness, and unstable gait.  EXAM: MRI HEAD WITHOUT  CONTRAST  TECHNIQUE: Multiplanar, multiecho pulse sequences of the brain and surrounding structures were obtained without intravenous contrast.  COMPARISON:  11/25/2010 MRI of the head.  FINDINGS: Brain: Enlarged pituitary measuring 11 mm craniocaudal with convex superior margin, possibly in contact with the optic chiasm. In comparison with prior MRI this may be minimally increased in size.  Nonspecific scattered foci of T2 FLAIR hyperintense foci predominantly in subcortical white matter, with few foci in the left cerebellar hemisphere, is are mildly progressed in comparison with the prior MRI of the brain. No lesion is identified within the brainstem, basal ganglia, or corpus callosum.  No focal mass effect. No abnormal susceptibility hypointensity to indicate intracranial hemorrhage. No diffusion signal abnormality. No extra-axial collection. No hydrocephalus.  Vascular: Normal flow voids.  Skull and upper cervical spine: Normal marrow signal.  Sinuses/Orbits: Negative.  Other: None.  IMPRESSION: 1. No acute intracranial abnormality identified. 2. Foci of white matter T2 FLAIR hyperintensity probably represent mild chronic microvascular ischemic changes and are mildly progressed from 2012. 3. Enlarged pituitary gland may represent an underlying pituitary adenoma, stable to minimally increased in size from 2012. This can be further characterized with a pituitary protocol MRI of the brain with and without intravenous contrast.   Electronically Signed   By: Kristine Garbe M.D.   On: 04/22/2016 22:46   CT HEAD WO CONTRAST   Narrative   CLINICAL DATA:  Lower extremity weakness, ataxia  EXAM: CT HEAD WITHOUT CONTRAST  TECHNIQUE: Contiguous axial images were obtained  from the base of the skull through the vertex without intravenous contrast.  COMPARISON:  None.  FINDINGS: Brain: No evidence of acute infarction, hemorrhage, hydrocephalus, extra-axial  collection or mass lesion/mass effect.  Subcortical white matter and periventricular small vessel ischemic changes.  Vascular: No hyperdense vessel or unexpected calcification.  Skull: Normal. Negative for fracture or focal lesion.  Sinuses/Orbits: No acute finding.  Other: None.  IMPRESSION: No evidence of acute intracranial abnormality.  Mild small vessel ischemic changes.   Electronically Signed   By: Julian Hy M.D.   On: 04/22/2016 17:47   Results for orders placed or performed during the hospital encounter of 11/24/10  MR Brain W Wo Contrast   Narrative   *RADIOLOGY REPORT*  Clinical Data: 51 year old female with gait dysfunction, lower extremity weakness.  MRI HEAD WITHOUT AND WITH CONTRAST  Technique:  Multiplanar, multiecho pulse sequences of the brain and surrounding structures were obtained according to standard protocol without and with intravenous contrast  Contrast: 82m MULTIHANCE GADOBENATE DIMEGLUMINE 529 MG/ML IV SOLN In conjunction with MR of the thoracic MRI which is reported separately.  Comparison: None.  Findings: Normal cerebral volume. No restricted diffusion to suggest acute infarction.  No midline shift, mass effect, evidence of mass lesion, ventriculomegaly, extra-axial collection or acute intracranial hemorrhage.  Cervicomedullary junction and pituitary (incidental congenitally narrow sella turcica) are within normal limits.  Major intracranial vascular flow voids are preserved. Scattered small T2 and FLAIR hyperintense foci in the cerebral white matter, mostly subcortical.  No associated enhancement.  Deep gray matter nuclei, brainstem, and cerebellum within normal limits. Negative visualized cervical spine. No abnormal enhancement identified.  Visualized bone marrow signal is within normal limits.  Visualized paranasal sinuses and mastoids are clear.  Visualized orbit soft tissues are within normal limits.  Visualized  scalp soft tissues are within normal limits.  IMPRESSION: 1. No acute intracranial abnormality.  Mildly age advanced nonspecific cerebral white matter signal changes.  No associated enhancement. 2.  Otherwise normal MRI appearance of the brain. 3.  Thoracic MRI reported separately.  Original Report Authenticated By: HRandall An M.D.   Psychological-Psychiatric History: {Hx; Psychological-Psychiatric History:210120512} Gastrointestinal History: {Hx; Gastrointestinal:210120527} Genitourinary History: {Hx; Genitourinary:210120506} Hematological History: {Hx; Hematological:210120510} Endocrine History: {Hx; Endocrine history:210120509} Rheumatologic History: {Hx; Rheumatological:210120530} Musculoskeletal History: {Hx; Musculoskeletal:210120528} Work History: {Hx; Work history:210120514}  Allergies  Ms. HDelphina Cahillhas No Known Allergies.  Laboratory Chemistry  Inflammation Markers Lab Results  Component Value Date   ESRSEDRATE 2 11/25/2010   (CRP: Acute Phase) (ESR: Chronic Phase) Renal Function Markers Lab Results  Component Value Date   BUN 13 05/03/2016   CREATININE 1.02 (H) 05/03/2016   GFRAA >60 05/03/2016   GFRNONAA >60 05/03/2016   Hepatic Function Markers Lab Results  Component Value Date   AST 21 11/25/2010   ALT 18 11/25/2010   ALBUMIN 3.0 (L) 11/25/2010   ALKPHOS 43 11/25/2010   Electrolytes Lab Results  Component Value Date   NA 138 05/03/2016   K 3.8 05/03/2016   CL 104 05/03/2016   CALCIUM 9.2 05/03/2016   Neuropathy Markers Lab Results  Component Value Date   VITAMINB12 1,476 (H) 04/22/2016   Bone Pathology Markers Lab Results  Component Value Date   ALKPHOS 43 11/25/2010   CALCIUM 9.2 05/03/2016   Coagulation Parameters Lab Results  Component Value Date   INR 0.92 11/25/2010   LABPROT 12.6 11/25/2010   PLT 279 05/03/2016   Cardiovascular Markers Lab Results  Component Value Date   HGB 12.8 05/03/2016  HCT 39.5 05/03/2016    Note: Lab results reviewed.  PFSH  Drug: [name]  reports that she does not use drugs. Alcohol:  reports that she drinks about 0.6 oz of alcohol per week . Tobacco:  reports that she has never smoked. She has never used smokeless tobacco. Medical:  has a past medical history of Bipolar 1 disorder ([place]). Family: family history is not on file.  No past surgical history on file. Active Ambulatory Problems    Diagnosis Date Noted  . Abnormal MRI 04/25/2016  . Encounter for counseling 02/20/2016  . History of skin cancer 10/27/2014  . Chronic pain syndrome 08/22/2016  . Encounter for long-term opiate analgesic use 08/22/2016   Resolved Ambulatory Problems    Diagnosis Date Noted  . No Resolved Ambulatory Problems   Past Medical History:  Diagnosis Date  . Bipolar 1 disorder ([place])    Constitutional Exam  General appearance: Well nourished, well developed, and well hydrated. In no apparent acute distress There were no vitals filed for this visit. BMI Assessment: Estimated body mass index is 20.89 kg/m as calculated from the following:   Height as of 04/25/16: '5\' 6"'  (1.676 m).   Weight as of 04/25/16: 129 lb 6.4 oz (58.7 kg).  BMI interpretation table: BMI level Category Range association with higher incidence of chronic pain  <18 kg/m2 Underweight   18.5-24.9 kg/m2 Ideal body weight   25-29.9 kg/m2 Overweight Increased incidence by 20%  30-34.9 kg/m2 Obese (Class I) Increased incidence by 68%  35-39.9 kg/m2 Severe obesity (Class II) Increased incidence by 136%  >40 kg/m2 Extreme obesity (Class III) Increased incidence by 254%   BMI Readings from Last 4 Encounters:  04/25/16 20.89 kg/m  04/22/16 20.60 kg/m  04/22/16 20.52 kg/m  04/22/16 20.52 kg/m   Wt Readings from Last 4 Encounters:  04/25/16 129 lb 6.4 oz (58.7 kg)  04/22/16 131 lb 8 oz (59.6 kg)  04/22/16 131 lb (59.4 kg)  04/22/16 131 lb (59.4 kg)  Psych/Mental status: Alert, oriented x 3 (person, place, &  time)       Eyes: PERLA Respiratory: No evidence of acute respiratory distress  Cervical Spine Exam  Inspection: No masses, redness, or swelling Alignment: Symmetrical Functional ROM: Unrestricted ROM      Stability: No instability detected Muscle strength & Tone: Functionally intact Sensory: Unimpaired Palpation: No palpable anomalies              Upper Extremity (UE) Exam    Side: Right upper extremity  Side: Left upper extremity  Inspection: No masses, redness, swelling, or asymmetry. No contractures  Inspection: No masses, redness, swelling, or asymmetry. No contractures  Functional ROM: Unrestricted ROM          Functional ROM: Unrestricted ROM          Muscle strength & Tone: Functionally intact  Muscle strength & Tone: Functionally intact  Sensory: Unimpaired  Sensory: Unimpaired  Palpation: No palpable anomalies              Palpation: No palpable anomalies              Specialized Test(s): Deferred         Specialized Test(s): Deferred          Thoracic Spine Exam  Inspection: No masses, redness, or swelling Alignment: Symmetrical Functional ROM: Unrestricted ROM Stability: No instability detected Sensory: Unimpaired Muscle strength & Tone: No palpable anomalies  Lumbar Spine Exam  Inspection: No masses, redness, or swelling  Alignment: Symmetrical Functional ROM: Unrestricted ROM      Stability: No instability detected Muscle strength & Tone: Functionally intact Sensory: Unimpaired Palpation: No palpable anomalies       Provocative Tests: Lumbar Hyperextension and rotation test: evaluation deferred today       Patrick's Maneuver: evaluation deferred today                    Gait & Posture Assessment  Ambulation: Unassisted Gait: Relatively normal for age and body habitus Posture: WNL   Lower Extremity Exam    Side: Right lower extremity  Side: Left lower extremity  Inspection: No masses, redness, swelling, or asymmetry. No contractures  Inspection: No masses,  redness, swelling, or asymmetry. No contractures  Functional ROM: Unrestricted ROM          Functional ROM: Unrestricted ROM          Muscle strength & Tone: Functionally intact  Muscle strength & Tone: Functionally intact  Sensory: Unimpaired  Sensory: Unimpaired  Palpation: No palpable anomalies  Palpation: No palpable anomalies   Assessment  Primary Diagnosis & Pertinent Problem List: Diagnoses of Chronic pain syndrome and Encounter for long-term opiate analgesic use were pertinent to this visit.  Visit Diagnosis: 1. Chronic pain syndrome   2. Encounter for long-term opiate analgesic use    Plan of Care  Initial treatment plan:  Please be advised that as per protocol, today's visit has been an evaluation only. We have not taken over the patient's controlled substance management.  Problem-specific plan: No problem-specific Assessment & Plan notes found for this encounter.  Ordered Lab-work, Procedure(s), Referral(s), & Consult(s): No orders of the defined types were placed in this encounter.  Pharmacotherapy: Medications ordered:  No orders of the defined types were placed in this encounter.  Medications administered during this visit: [name] had no medications administered during this visit.   Pharmacotherapy under consideration:  Opioid Analgesics: The patient was informed that there is no guarantee that she would be a candidate for opioid analgesics. The decision will be made following CDC guidelines. This decision will be based on the results of diagnostic studies, as well as Ms. Herron's risk profile.  Membrane stabilizer: To be determined at a later time Muscle relaxant: To be determined at a later time NSAID: To be determined at a later time Other analgesic(s): To be determined at a later time   Interventional therapies under consideration: [name] was informed that there is no guarantee that she would be a candidate for interventional therapies. The decision  will be based on the results of diagnostic studies, as well as Ms. Herron's risk profile.  Possible procedure(s): ***   Provider-requested follow-up: No Follow-up on file.  Future Appointments Date Time Provider [place]  08/22/2016 1:00 PM Vevelyn Francois, NP [facility] None    Primary Care Physician: Harlan Stains, MD Location: [facility] Note by:  Date: 08/22/2016; Time: 9:05 AM  Pain Score Disclaimer: We use the NRS-11 scale. This is a self-reported, subjective measurement of pain severity with only modest accuracy. It is used primarily to identify changes within a particular patient. It must be understood that outpatient pain scales are significantly less accurate that those used for research, where they can be applied under ideal controlled circumstances with minimal exposure to variables. In reality, the score is likely to be a combination of pain intensity and pain affect, where pain affect describes the degree of emotional arousal or changes in  action readiness caused by the sensory experience of pain. Factors such as social and work situation, setting, emotional state, anxiety levels, expectation, and prior pain experience may influence pain perception and show large inter-individual differences that may also be affected by time variables.  Patient instructions provided during this appointment: There are no Patient Instructions on file for this visit.

## 2016-09-17 ENCOUNTER — Encounter: Payer: Self-pay | Admitting: Neurology

## 2016-09-17 NOTE — Telephone Encounter (Signed)
Error

## 2017-02-27 ENCOUNTER — Other Ambulatory Visit (HOSPITAL_COMMUNITY)
Admission: RE | Admit: 2017-02-27 | Discharge: 2017-02-27 | Disposition: A | Discharge status: Home / Self Care | Payer: 59 | Source: Ambulatory Visit | Attending: Family Medicine | Admitting: Family Medicine

## 2017-02-27 ENCOUNTER — Other Ambulatory Visit: Payer: Self-pay | Admitting: Family Medicine

## 2017-02-27 DIAGNOSIS — Z13220 Encounter for screening for lipoid disorders: Secondary | ICD-10-CM | POA: Diagnosis not present

## 2017-02-27 DIAGNOSIS — Z124 Encounter for screening for malignant neoplasm of cervix: Secondary | ICD-10-CM | POA: Insufficient documentation

## 2017-02-27 DIAGNOSIS — Z0000 Encounter for general adult medical examination without abnormal findings: Secondary | ICD-10-CM | POA: Diagnosis not present

## 2017-02-28 LAB — CYTOLOGY - PAP
Diagnosis: NEGATIVE
HPV: NOT DETECTED

## 2017-03-04 DIAGNOSIS — Z1211 Encounter for screening for malignant neoplasm of colon: Secondary | ICD-10-CM | POA: Diagnosis not present

## 2017-05-12 DIAGNOSIS — R102 Pelvic and perineal pain: Secondary | ICD-10-CM | POA: Diagnosis not present

## 2017-05-12 DIAGNOSIS — R3129 Other microscopic hematuria: Secondary | ICD-10-CM | POA: Diagnosis not present

## 2017-05-13 DIAGNOSIS — M542 Cervicalgia: Secondary | ICD-10-CM | POA: Diagnosis not present

## 2017-05-13 DIAGNOSIS — M545 Low back pain: Secondary | ICD-10-CM | POA: Diagnosis not present

## 2017-05-14 DIAGNOSIS — N200 Calculus of kidney: Secondary | ICD-10-CM | POA: Diagnosis not present

## 2017-05-14 DIAGNOSIS — R102 Pelvic and perineal pain: Secondary | ICD-10-CM | POA: Diagnosis not present

## 2017-05-14 DIAGNOSIS — R109 Unspecified abdominal pain: Secondary | ICD-10-CM | POA: Diagnosis not present

## 2017-05-14 DIAGNOSIS — N1330 Unspecified hydronephrosis: Secondary | ICD-10-CM | POA: Diagnosis not present

## 2017-05-22 DIAGNOSIS — M542 Cervicalgia: Secondary | ICD-10-CM | POA: Diagnosis not present

## 2017-05-22 DIAGNOSIS — M545 Low back pain: Secondary | ICD-10-CM | POA: Diagnosis not present

## 2017-05-28 DIAGNOSIS — M545 Low back pain: Secondary | ICD-10-CM | POA: Diagnosis not present

## 2017-05-28 DIAGNOSIS — M542 Cervicalgia: Secondary | ICD-10-CM | POA: Diagnosis not present

## 2017-05-30 DIAGNOSIS — N289 Disorder of kidney and ureter, unspecified: Secondary | ICD-10-CM | POA: Diagnosis not present

## 2017-07-24 DIAGNOSIS — R55 Syncope and collapse: Secondary | ICD-10-CM | POA: Diagnosis not present

## 2017-07-24 DIAGNOSIS — I951 Orthostatic hypotension: Secondary | ICD-10-CM | POA: Diagnosis not present

## 2017-07-29 DIAGNOSIS — I951 Orthostatic hypotension: Secondary | ICD-10-CM | POA: Diagnosis not present

## 2017-07-29 DIAGNOSIS — R55 Syncope and collapse: Secondary | ICD-10-CM | POA: Diagnosis not present

## 2017-10-23 DIAGNOSIS — R05 Cough: Secondary | ICD-10-CM | POA: Diagnosis not present

## 2017-10-23 DIAGNOSIS — R142 Eructation: Secondary | ICD-10-CM | POA: Diagnosis not present

## 2017-10-23 DIAGNOSIS — R1310 Dysphagia, unspecified: Secondary | ICD-10-CM | POA: Diagnosis not present

## 2017-10-24 ENCOUNTER — Other Ambulatory Visit: Payer: Self-pay | Admitting: Family Medicine

## 2017-10-24 ENCOUNTER — Ambulatory Visit
Admission: RE | Admit: 2017-10-24 | Discharge: 2017-10-24 | Disposition: A | Discharge status: Home / Self Care | Payer: Self-pay | Source: Ambulatory Visit | Attending: Family Medicine | Admitting: Family Medicine

## 2017-10-24 DIAGNOSIS — R059 Cough, unspecified: Secondary | ICD-10-CM

## 2017-10-24 DIAGNOSIS — R05 Cough: Secondary | ICD-10-CM

## 2017-12-13 ENCOUNTER — Encounter: Payer: Self-pay | Admitting: Psychiatry

## 2017-12-13 ENCOUNTER — Ambulatory Visit (INDEPENDENT_AMBULATORY_CARE_PROVIDER_SITE_OTHER): Payer: 59 | Admitting: Psychiatry

## 2017-12-13 VITALS — BP 109/61 | HR 104

## 2017-12-13 DIAGNOSIS — F3181 Bipolar II disorder: Secondary | ICD-10-CM | POA: Diagnosis not present

## 2017-12-13 DIAGNOSIS — F419 Anxiety disorder, unspecified: Secondary | ICD-10-CM | POA: Diagnosis not present

## 2017-12-13 DIAGNOSIS — F900 Attention-deficit hyperactivity disorder, predominantly inattentive type: Secondary | ICD-10-CM | POA: Diagnosis not present

## 2017-12-13 MED ORDER — LAMOTRIGINE 150 MG PO TABS: 150.0000 mg | ORAL_TABLET | Freq: Every day | ORAL | 5 refills | Status: DC

## 2017-12-13 MED ORDER — LURASIDONE HCL 60 MG PO TABS: ORAL_TABLET | ORAL | 3 refills | Status: DC

## 2017-12-13 MED ORDER — ALPRAZOLAM 0.25 MG PO TABS: 0.2500 mg | ORAL_TABLET | Freq: Three times a day (TID) | ORAL | 2 refills | Status: DC | PRN

## 2017-12-13 MED ORDER — LURASIDONE HCL 40 MG PO TABS: 40.0000 mg | ORAL_TABLET | Freq: Every day | ORAL | 0 refills | Status: DC

## 2017-12-13 MED ORDER — TRAZODONE HCL 100 MG PO TABS: ORAL_TABLET | ORAL | 5 refills | Status: DC

## 2017-12-13 MED ORDER — CITALOPRAM HYDROBROMIDE 40 MG PO TABS: ORAL_TABLET | ORAL | 3 refills | Status: DC

## 2017-12-13 NOTE — Progress Notes (Signed)
Tabitha Bennett [id number] 1965-08-19 52 y.o.  Subjective:   Patient ID:  Tabitha Bennett is a 52 y.o. (DOB 1965-05-18) female.  Chief Complaint:  Chief Complaint  Patient presents with  . Anxiety  . Depression    HPI Tabitha Bennett presents to the office today for follow-up of anxiety, depression, and impaired concentration. "I have had a lot of downs." She reports that her mood is best controlled with Latuda 30 mg qd, however she notices some increased apetite and weight gain. She notices that her mood is less depressed with Latuda but has some irritability and anxiety "when it wears off" later in the day. Reports that husband has noticed she is quicker to get upset recently. Has been babysitting her almost 86 yo grandson and reports that this has "been good" for her mood. She reports that when she takes Xanax her mood,energy, and motivation is improved. She reports that she is sleeping well. Appetite stable. Reports that her concentration has been "better." Energy and motivation have been "much better." Denies impulsive or risky behaviors. Denies SI.   Medications: I have reviewed the patient's current medications. Allergies: No Known Allergies  Past Medical History:  Diagnosis Date  . Bipolar 1 disorder (HCC)     No past surgical history on file.  No family history on file.  Social History   Socioeconomic History  . Marital status: Married    Spouse name: Not on file  . Number of children: Not on file  . Years of education: Not on file  . Highest education level: Not on file  Occupational History  . Not on file  Social Needs  . Financial resource strain: Not on file  . Food insecurity:    Worry: Not on file    Inability: Not on file  . Transportation needs:    Medical: Not on file    Non-medical: Not on file  Tobacco Use  . Smoking status: Never Smoker  . Smokeless tobacco: Never Used  Substance and Sexual Activity  . Alcohol use: Yes    Alcohol/week:  1.0 standard drinks    Types: 1 Glasses of wine per week    Comment: 1 glass of wine per week  . Drug use: No  . Sexual activity: Not on file  Lifestyle  . Physical activity:    Days per week: Not on file    Minutes per session: Not on file  . Stress: Not on file  Relationships  . Social connections:    Talks on phone: Not on file    Gets together: Not on file    Attends religious service: Not on file    Active member of club or organization: Not on file    Attends meetings of clubs or organizations: Not on file    Relationship status: Not on file  . Intimate partner violence:    Fear of current or ex partner: Not on file    Emotionally abused: Not on file    Physically abused: Not on file    Forced sexual activity: Not on file  Other Topics Concern  . Not on file  Social History Narrative  . Not on file    Past Medical History, Surgical history, Social history, and Family history were reviewed and updated as appropriate.   Please see review of systems for further details on the patient's review from today.   Review of Systems:  Review of Systems  Constitutional: Negative.   HENT: Negative.   Musculoskeletal: Negative.  Objective:   Physical Exam:  BP 109/61   Pulse (!) 104   Physical Exam  Constitutional: She is oriented to person, place, and time. She appears well-developed. No distress.  Musculoskeletal: Normal range of motion.  Neurological: She is alert and oriented to person, place, and time. Coordination normal.  Psychiatric: Her speech is normal and behavior is normal. Judgment and thought content normal. Her mood appears anxious. Her affect is not angry, not blunt, not labile and not inappropriate. Cognition and memory are normal. She does not exhibit a depressed mood. She expresses no homicidal and no suicidal ideation. She expresses no suicidal plans and no homicidal plans.    Lab Review:     Component Value Date/Time   NA 138 05/03/2016 1542   K  3.8 05/03/2016 1542   CL 104 05/03/2016 1542   CO2 26 05/03/2016 1542   GLUCOSE 100 (H) 05/03/2016 1542   BUN 13 05/03/2016 1542   CREATININE 1.02 (H) 05/03/2016 1542   CALCIUM 9.2 05/03/2016 1542   PROT 6.0 11/25/2010 0942   ALBUMIN 3.0 (L) 11/25/2010 0942   AST 21 11/25/2010 0942   ALT 18 11/25/2010 0942   ALKPHOS 43 11/25/2010 0942   BILITOT 0.2 (L) 11/25/2010 0942   GFRNONAA >60 05/03/2016 1542   GFRAA >60 05/03/2016 1542       Component Value Date/Time   WBC 6.6 05/03/2016 1542   RBC 4.09 05/03/2016 1542   HGB 12.8 05/03/2016 1542   HCT 39.5 05/03/2016 1542   PLT 279 05/03/2016 1542   MCV 96.6 05/03/2016 1542   MCH 31.3 05/03/2016 1542   MCHC 32.4 05/03/2016 1542   RDW 12.3 05/03/2016 1542     Assessment: Plan:   Pt seen for 30 minutes and greater than 50% of session spent counseling pt re: anxiety s/s and that if anxiety s/s are related to [place] it may improve with dose increase. Pt provided with samples of Latuda 40 mg po qd to determine if anxiety and irritability improves at this dose. Pt  Instructed to call clinic if she would like script for latuda 40 mg po qd to be sent to pharmacy. Will re-order Latuda 60 mg 1/2 tab po qd at this time. Will increase Xanax to 0.5 mg po TID prn anxiety and advised pt to use lowest possible effective dose. Will continue Vyvanse 60 mg po q am (no scripts needed today since it was recently filled and pt has 2 scripts remaining).  Bipolar II disorder (HCC) - Plan: Lurasidone HCl (LATUDA) 60 MG TABS, citalopram (CELEXA) 40 MG tablet, lamoTRIgine (LAMICTAL) 150 MG tablet, traZODone (DESYREL) 100 MG tablet  Attention deficit hyperactivity disorder (ADHD), predominantly inattentive type  Anxiety disorder, unspecified type - Plan: ALPRAZolam (XANAX) 0.25 MG tablet  Please see After Visit Summary for patient specific instructions.  No future appointments.  No orders of the defined types were placed in this encounter.      -------------------------------

## 2017-12-24 ENCOUNTER — Telehealth: Payer: Self-pay | Admitting: Psychiatry

## 2017-12-24 ENCOUNTER — Other Ambulatory Visit: Payer: Self-pay

## 2017-12-24 DIAGNOSIS — F419 Anxiety disorder, unspecified: Secondary | ICD-10-CM

## 2017-12-24 MED ORDER — ALPRAZOLAM 0.5 MG PO TABS: 0.5000 mg | ORAL_TABLET | Freq: Three times a day (TID) | ORAL | 2 refills | Status: DC | PRN

## 2017-12-24 NOTE — Telephone Encounter (Signed)
So I called pt and she said she does not use [facility] always uses [facility] [address].   Explained rx as well. She said she tried 40mg  Latuda but made her "mean" so wants to stay at 30mg /day

## 2017-12-24 NOTE — Telephone Encounter (Signed)
Revised Xanax script sent to [facility]. Latuda 60 mg 1/2-1 tab po qd sent to pharmacy at time of visit.

## 2017-12-25 ENCOUNTER — Telehealth: Payer: Self-pay | Admitting: Psychiatry

## 2017-12-25 DIAGNOSIS — F419 Anxiety disorder, unspecified: Secondary | ICD-10-CM

## 2017-12-25 MED ORDER — ALPRAZOLAM 0.5 MG PO TABS: 0.5000 mg | ORAL_TABLET | Freq: Three times a day (TID) | ORAL | 2 refills | Status: DC | PRN

## 2017-12-25 NOTE — Telephone Encounter (Signed)
Pt reports that she uses [facility] instead of [facility] and would like Xanax script sent there instead. Script sent.

## 2017-12-25 NOTE — Telephone Encounter (Signed)
Called [facility] pharmacy to cancel xanax prescription. Pharmacist took out the order.

## 2017-12-30 ENCOUNTER — Ambulatory Visit (INDEPENDENT_AMBULATORY_CARE_PROVIDER_SITE_OTHER): Payer: 59 | Admitting: Psychiatry

## 2017-12-30 DIAGNOSIS — F419 Anxiety disorder, unspecified: Secondary | ICD-10-CM | POA: Diagnosis not present
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
## 2018-01-05 ENCOUNTER — Encounter: Payer: Self-pay | Admitting: Emergency Medicine

## 2018-01-05 DIAGNOSIS — F3181 Bipolar II disorder: Secondary | ICD-10-CM

## 2018-01-05 DIAGNOSIS — F900 Attention-deficit hyperactivity disorder, predominantly inattentive type: Secondary | ICD-10-CM

## 2018-01-13 ENCOUNTER — Other Ambulatory Visit: Payer: Self-pay

## 2018-01-13 DIAGNOSIS — F3181 Bipolar II disorder: Secondary | ICD-10-CM

## 2018-01-13 NOTE — Telephone Encounter (Signed)
Need to verify pharmacy with patient using multiple pharmacies

## 2018-01-16 ENCOUNTER — Ambulatory Visit: Payer: 59 | Admitting: Psychiatry

## 2018-01-17 ENCOUNTER — Ambulatory Visit (INDEPENDENT_AMBULATORY_CARE_PROVIDER_SITE_OTHER): Payer: 59 | Admitting: Psychiatry

## 2018-01-17 DIAGNOSIS — F419 Anxiety disorder, unspecified: Secondary | ICD-10-CM | POA: Diagnosis not present

## 2018-01-17 DIAGNOSIS — H16223 Keratoconjunctivitis sicca, not specified as Sjogren's, bilateral: Secondary | ICD-10-CM | POA: Diagnosis not present

## 2018-01-17 NOTE — Progress Notes (Signed)
      [facility] Counselor/Therapist Progress Note   Patient ID: Tabitha Bennett, MRN: [id number]  Date: 01/17/2018  Timespent:  58 minutes   Treatment Type: Individual   Reported Symptoms: "mild depression" , anxiety    Mental Status Exam:    Appearance:   Casual     Behavior:  Appropriate and Sharing  Motor:  Normal  Speech/Language:   Normal Rate  Affect:  Blunt  Mood:  depressed  Thought process:  normal  Thought content:    WNL  Sensory/Perceptual disturbances:    WNL  Orientation:  oriented to person, place, time/date, situation, day of week, month of year and year  Attention:  Good  Concentration:  Fair  Memory:  WNL  Fund of knowledge:   Good  Insight:    Fair  Judgment:   Good  Impulse Control:  Good     Risk Assessment: Danger to Self:  No Self-injurious Behavior: No Danger to Others: No Duty to Warn:no Physical Aggression / Violence:No  Access to Firearms a concern: No  Gang Involvement:No    Subjective:   Patient in today with symptoms of "mild depression, anxiety".  States she was anxious coming in today but feels calmer now. 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  Feels good about what she does there and really enjoys it.  Has been keeping 2 yr old grandson that limits her as far as more ESL classes but adds that child's parents are able to provide care for him.  States she plans to talk to them soon about a change so she can pursue teaching more in the ESL classes. Is more open and talkative this session.  Openly talked about having weird thoughts at times but "wants to talk about that later".  Concerned that "I didn't give my kids what they needed when they were growing up."  States "I was too sick to provide for them".  Mentioned some of her disturbing thoughts which are sexualize but from her past and not occurring now.  "I do feel the  anti-psychotic has helped with those thoughts."       Interventions: Solution-Oriented/Positive Psychology and Ego-Supportive   Diagnosis:   ICD-10-CM   1. Anxiety disorder, unspecified type F41.9      Plan:  Plan to see patient within 3 weeks to continue goal-directed treatment.   Mathis Fare, LCSW

## 2018-01-24 ENCOUNTER — Encounter: Payer: Self-pay | Admitting: Psychiatry

## 2018-01-24 ENCOUNTER — Ambulatory Visit (INDEPENDENT_AMBULATORY_CARE_PROVIDER_SITE_OTHER): Payer: 59 | Admitting: Psychiatry

## 2018-01-24 VITALS — BP 105/73 | HR 76

## 2018-01-24 DIAGNOSIS — F900 Attention-deficit hyperactivity disorder, predominantly inattentive type: Secondary | ICD-10-CM

## 2018-01-24 DIAGNOSIS — F3181 Bipolar II disorder: Secondary | ICD-10-CM

## 2018-01-24 DIAGNOSIS — F419 Anxiety disorder, unspecified: Secondary | ICD-10-CM

## 2018-01-24 MED ORDER — LISDEXAMFETAMINE DIMESYLATE 60 MG PO CAPS: 60.0000 mg | ORAL_CAPSULE | ORAL | 0 refills | Status: DC

## 2018-01-24 MED ORDER — LISDEXAMFETAMINE DIMESYLATE 60 MG PO CAPS: 60.0000 mg | ORAL_CAPSULE | Freq: Every day | ORAL | 0 refills | Status: DC

## 2018-01-24 MED ORDER — LAMOTRIGINE 150 MG PO TABS: 225.0000 mg | ORAL_TABLET | Freq: Every day | ORAL | 2 refills | Status: DC

## 2018-01-24 NOTE — Progress Notes (Signed)
Tabitha Bennett [id number] [birth year] 52 y.o.  Subjective:   Patient ID:  Tabitha Bennett is a 52 y.o. (DOB [birth year]) female.  Chief Complaint:  Chief Complaint  Patient presents with  . Follow-up    Depression, anxiety, impaired concentration    HPI Tabitha Bennett presents to the office today for follow-up of mood and anxiety. Pt called on 01/15/18 and c/o worsening depression and asked about medication changes. She reports that she also noticed some increased anxiety and would feel anxious and shaky when Xanax wore off. She reports that she also noticed some irritability. Pt advised to increase Lamictal to 200 mg po qd and she reports that this has been effective and notices that her mood is less depressed and less irritable. She reports that anxiety has improved some and is no longer noticing as significant difference when Xanax wears off. She reports at times she will "feel nervous and down" without apparent trigger.  She reports that her mood is currently "great. I want to get out and do things." Reports that she has been wanting to leave her house again and recognizes now that she had been withdrawn. She reports improved energy and motivation. She reports adequate sleep with Trazodone. She reports that her appetite is low. Denies any recent weight loss. Denies any change in concentration. Denies any risky or impulsive behavior other than some increase in spending. Reports most of the items are things that they need, aside from toys for her grandson. Denies SI.   Continues to keep grandson.   Past medication trials: Celexa Lexapro Paxil Prozac Sertraline Effexor Wellbutrin Lamictal Vraylar-weight gain Latuda Rexulti-tremor Adderall XR Vyvanse Xanax Trazodone   Review of Systems:  Review of Systems  HENT:       She reports that she had a recent eye twitch and that this has almost resolved.   Gastrointestinal: Negative.   Musculoskeletal: Negative.  Negative  for gait problem.  Neurological: Negative for tremors.  Psychiatric/Behavioral:       Please refer to HPI    Medications: I have reviewed the patient's current medications.  Current Outpatient Medications  Medication Sig Dispense Refill  . ALPRAZolam (XANAX) 0.5 MG tablet Take 1 tablet (0.5 mg total) by mouth 3 (three) times daily as needed for anxiety. 75 tablet 2  . Cholecalciferol (VITAMIN D3) 5000 units CAPS Take by mouth.    . citalopram (CELEXA) 40 MG tablet Take 1/2-1 tab po qd 30 tablet 3  . estradiol (ESTRACE) 0.5 MG tablet Take 0.5 mg by mouth at bedtime.   2  . [START ON 01/30/2018] lisdexamfetamine (VYVANSE) 60 MG capsule Take 1 capsule (60 mg total) by mouth daily. 30 capsule 0  . Lurasidone HCl (LATUDA) 60 MG TABS 1/2-1 po qd 30 tablet 3  . Omega-3 Fatty Acids (FISH OIL) 1000 MG CAPS Take by mouth.    . progesterone (PROMETRIUM) 200 MG capsule Take 200 mg by mouth at bedtime.    . traZODone (DESYREL) 100 MG tablet 1.5-2 tabs po qhs 60 tablet 5  . vitamin k 100 MCG tablet Take 100 mcg by mouth daily.    Marland Kitchen. lamoTRIgine (LAMICTAL) 150 MG tablet Take 1.5 tablets (225 mg total) by mouth daily. 45 tablet 2  . [START ON 02/27/2018] lisdexamfetamine (VYVANSE) 60 MG capsule Take 1 capsule (60 mg total) by mouth every morning. 30 capsule 0  . [START ON 03/27/2018] lisdexamfetamine (VYVANSE) 60 MG capsule Take 1 capsule (60 mg total) by mouth every morning. 30 capsule 0  No current facility-administered medications for this visit.     Medication Side Effects: None and Other: Sexual side effects  Allergies: No Known Allergies  Past Medical History:  Diagnosis Date  . Bipolar 1 disorder (HCC)     History reviewed. No pertinent family history.  Social History   Socioeconomic History  . Marital status: Married    Spouse name: Not on file  . Number of children: Not on file  . Years of education: Not on file  . Highest education level: Not on file  Occupational History  .  Not on file  Social Needs  . Financial resource strain: Not on file  . Food insecurity:    Worry: Not on file    Inability: Not on file  . Transportation needs:    Medical: Not on file    Non-medical: Not on file  Tobacco Use  . Smoking status: Never Smoker  . Smokeless tobacco: Never Used  Substance and Sexual Activity  . Alcohol use: Yes    Alcohol/week: 1.0 standard drinks    Types: 1 Glasses of wine per week    Comment: 1 glass of wine per week  . Drug use: No  . Sexual activity: Not on file  Lifestyle  . Physical activity:    Days per week: Not on file    Minutes per session: Not on file  . Stress: Not on file  Relationships  . Social connections:    Talks on phone: Not on file    Gets together: Not on file    Attends religious service: Not on file    Active member of club or organization: Not on file    Attends meetings of clubs or organizations: Not on file    Relationship status: Not on file  . Intimate partner violence:    Fear of current or ex partner: Not on file    Emotionally abused: Not on file    Physically abused: Not on file    Forced sexual activity: Not on file  Other Topics Concern  . Not on file  Social History Narrative  . Not on file    Past Medical History, Surgical history, Social history, and Family history were reviewed and updated as appropriate.   Please see review of systems for further details on the patient's review from today.   Objective:   Physical Exam:  BP 105/73   Pulse 76   Physical Exam  Constitutional: She is oriented to person, place, and time. She appears well-developed. No distress.  Musculoskeletal: She exhibits no deformity.  Neurological: She is alert and oriented to person, place, and time. Coordination normal.  Psychiatric: She has a normal mood and affect. Her speech is normal and behavior is normal. Judgment and thought content normal. Her mood appears not anxious. Her affect is not angry, not blunt, not  labile and not inappropriate. Cognition and memory are normal. She does not exhibit a depressed mood. She expresses no homicidal and no suicidal ideation. She expresses no suicidal plans and no homicidal plans.  Insight intact. No auditory or visual hallucinations. No delusions.     Lab Review:     Component Value Date/Time   NA 138 05/03/2016 1542   K 3.8 05/03/2016 1542   CL 104 05/03/2016 1542   CO2 26 05/03/2016 1542   GLUCOSE 100 (H) 05/03/2016 1542   BUN 13 05/03/2016 1542   CREATININE 1.02 (H) 05/03/2016 1542   CALCIUM 9.2 05/03/2016 1542   PROT 6.0  11/25/2010 0942   ALBUMIN 3.0 (L) 11/25/2010 0942   AST 21 11/25/2010 0942   ALT 18 11/25/2010 0942   ALKPHOS 43 11/25/2010 0942   BILITOT 0.2 (L) 11/25/2010 0942   GFRNONAA >60 05/03/2016 1542   GFRAA >60 05/03/2016 1542       Component Value Date/Time   WBC 6.6 05/03/2016 1542   RBC 4.09 05/03/2016 1542   HGB 12.8 05/03/2016 1542   HCT 39.5 05/03/2016 1542   PLT 279 05/03/2016 1542   MCV 96.6 05/03/2016 1542   MCH 31.3 05/03/2016 1542   MCHC 32.4 05/03/2016 1542   RDW 12.3 05/03/2016 1542    Lithium Lvl  Date Value Ref Range Status  04/22/2016 <0.06 (L) 0.60 - 1.20 mmol/L Final     No results found for: PHENYTOIN, PHENOBARB, VALPROATE, CBMZ   .res Assessment: Plan:   Pt seen for 30 minutes and greater than 50% of session spent counseling pt re: potential benefits, risks, and side effects of increasing Lamictal. Also discussed pt's concern about possible sexual side effects and which medications may be most likely to cause sexual side effects. Pt reports that she would like to continue current medications at this time and then consider medication changes in the future to possibly improve sexual side effects.  Continue Lamictal 150 mg 1.5 tabs po qd for mood. Discussed changed Lamictal to 200 mg po qd if it is difficult splitting tabs evenly. Continue Celexa 20 mg po qd for mood and anxiety. Continue Vyvanse 60 mg  po q am for ADHD.  Continue Xanax 0.5 mg po TID prn anxiety. Continue Latuda 30 mg po qd for mood s/s. Continue Trazodone 150-200 mg po QHS for insomnia Anxiety disorder, unspecified type  Bipolar II disorder (HCC) - Plan: lamoTRIgine (LAMICTAL) 150 MG tablet  ADHD, predominantly inattentive type - Plan: lisdexamfetamine (VYVANSE) 60 MG capsule, lisdexamfetamine (VYVANSE) 60 MG capsule, lisdexamfetamine (VYVANSE) 60 MG capsule  Please see After Visit Summary for patient specific instructions.  Future Appointments  Date Time Provider Department Center  04/23/2018  9:00 AM Corie Chiquito, PMHNP [facility] None    No orders of the defined types were placed in this encounter.     -------------------------------

## 2018-01-28 ENCOUNTER — Other Ambulatory Visit: Payer: Self-pay

## 2018-01-28 DIAGNOSIS — F3181 Bipolar II disorder: Secondary | ICD-10-CM

## 2018-02-05 ENCOUNTER — Telehealth: Payer: Self-pay

## 2018-02-05 NOTE — Telephone Encounter (Signed)
Prior authorization approved for Vyvanse 60 mg capsules.

## 2018-02-24 ENCOUNTER — Telehealth: Payer: Self-pay | Admitting: Psychiatry

## 2018-02-24 NOTE — Telephone Encounter (Signed)
Patient has questions concerning the dosage of her medication Xanax.

## 2018-02-25 ENCOUNTER — Other Ambulatory Visit: Payer: Self-pay

## 2018-02-25 DIAGNOSIS — F419 Anxiety disorder, unspecified: Secondary | ICD-10-CM

## 2018-02-25 MED ORDER — ALPRAZOLAM 0.5 MG PO TABS: 0.5000 mg | ORAL_TABLET | Freq: Three times a day (TID) | ORAL | 1 refills | Status: DC | PRN

## 2018-02-25 NOTE — Telephone Encounter (Signed)
Updated pharmacy pt aware.

## 2018-02-25 NOTE — Telephone Encounter (Signed)
Chart in your box 

## 2018-03-02 ENCOUNTER — Other Ambulatory Visit: Payer: Self-pay | Admitting: Psychiatry

## 2018-03-07 ENCOUNTER — Telehealth: Payer: Self-pay | Admitting: Psychiatry

## 2018-03-07 ENCOUNTER — Ambulatory Visit: Payer: 59 | Admitting: Psychiatry

## 2018-03-07 NOTE — Telephone Encounter (Signed)
PATIENT LEFT VM STATED SHE NEEDS A REFILL ON VYVANSE, WOULD LIKE FOR IT TO BE CALLED IN ASAP

## 2018-03-07 NOTE — Telephone Encounter (Signed)
Tabitha BabinskiMarilyn contacted pt to let her know rx is at [facility]

## 2018-03-14 ENCOUNTER — Ambulatory Visit: Payer: 59 | Admitting: Psychiatry

## 2018-03-15 ENCOUNTER — Other Ambulatory Visit: Payer: Self-pay | Admitting: Psychiatry

## 2018-03-15 DIAGNOSIS — F3181 Bipolar II disorder: Secondary | ICD-10-CM

## 2018-03-21 ENCOUNTER — Ambulatory Visit: Payer: 59 | Admitting: Psychiatry

## 2018-03-28 ENCOUNTER — Ambulatory Visit: Payer: 59 | Admitting: Psychiatry

## 2018-04-04 ENCOUNTER — Telehealth: Payer: Self-pay | Admitting: Psychiatry

## 2018-04-04 NOTE — Telephone Encounter (Signed)
Pt called frustrated that she cannot get her Vyvanse refilled.  [facility] and [facility] are not [facility] and [facility]  [facility] send [facility] to [facility] [address] where all her other prescriptions are.

## 2018-04-16 DIAGNOSIS — M1812 Unilateral primary osteoarthritis of first carpometacarpal joint, left hand: Secondary | ICD-10-CM | POA: Diagnosis not present

## 2018-04-16 DIAGNOSIS — M79642 Pain in left hand: Secondary | ICD-10-CM | POA: Diagnosis not present

## 2018-04-17 ENCOUNTER — Other Ambulatory Visit: Payer: Self-pay | Admitting: Psychiatry

## 2018-04-17 DIAGNOSIS — F3181 Bipolar II disorder: Secondary | ICD-10-CM

## 2018-04-18 ENCOUNTER — Other Ambulatory Visit: Payer: Self-pay | Admitting: Psychiatry

## 2018-04-18 DIAGNOSIS — F3181 Bipolar II disorder: Secondary | ICD-10-CM

## 2018-04-23 ENCOUNTER — Ambulatory Visit: Payer: 59 | Admitting: Psychiatry

## 2018-04-30 ENCOUNTER — Other Ambulatory Visit: Payer: Self-pay | Admitting: Psychiatry

## 2018-04-30 DIAGNOSIS — F419 Anxiety disorder, unspecified: Secondary | ICD-10-CM

## 2018-05-01 ENCOUNTER — Other Ambulatory Visit: Payer: Self-pay | Admitting: Psychiatry

## 2018-05-01 DIAGNOSIS — F3181 Bipolar II disorder: Secondary | ICD-10-CM

## 2018-05-05 ENCOUNTER — Telehealth: Payer: Self-pay | Admitting: Psychiatry

## 2018-05-05 DIAGNOSIS — F900 Attention-deficit hyperactivity disorder, predominantly inattentive type: Secondary | ICD-10-CM

## 2018-05-05 MED ORDER — LISDEXAMFETAMINE DIMESYLATE 60 MG PO CAPS: 60.0000 mg | ORAL_CAPSULE | Freq: Every day | ORAL | 0 refills | Status: DC

## 2018-05-05 NOTE — Telephone Encounter (Signed)
Pt aware.

## 2018-05-05 NOTE — Telephone Encounter (Signed)
Patient requesting refill for Vyvanse. Stated she didn't realize she was out.Stated she wants it refill today. Hasn't been seen since 11/19. [facility] [address]

## 2018-05-12 DIAGNOSIS — M79645 Pain in left finger(s): Secondary | ICD-10-CM | POA: Diagnosis not present

## 2018-05-13 ENCOUNTER — Ambulatory Visit: Payer: 59 | Admitting: Psychiatry

## 2018-05-14 ENCOUNTER — Encounter: Payer: Self-pay | Admitting: Psychiatry

## 2018-05-14 ENCOUNTER — Ambulatory Visit: Payer: BLUE CROSS/BLUE SHIELD | Admitting: Allergy

## 2018-05-15 ENCOUNTER — Encounter: Payer: Self-pay | Admitting: Psychiatry

## 2018-05-17 ENCOUNTER — Other Ambulatory Visit: Payer: Self-pay | Admitting: Psychiatry

## 2018-05-17 DIAGNOSIS — F3181 Bipolar II disorder: Secondary | ICD-10-CM

## 2018-05-19 NOTE — Telephone Encounter (Signed)
Due for her appt in Feb. Call to schedule

## 2018-05-20 ENCOUNTER — Telehealth: Payer: Self-pay | Admitting: Psychiatry

## 2018-05-20 NOTE — Telephone Encounter (Signed)
See previous request. Pt is asking for her NEXT RF of Vyvanse. She does not need it yet, but [facility]  [facility] for [facility].

## 2018-05-20 NOTE — Telephone Encounter (Signed)
Patient showed up thought she had appointment today, scheduled appt. For 03/25 need refill on [facility] [address] patient also stated she's been missing appointments due to cough and was told not to come in

## 2018-05-22 NOTE — Telephone Encounter (Signed)
FYI Explained to pt that she needs to come for her appt on the 25th of March and provider would submit more of her Vyvanse. She was very panicky because she says she will be out. Last fill was 02/24 and 30 days is 03/25. Reassured her she would make it to that date. She was apprecitive for my call.  Explained she has missed some appts, she says there's been a mistake, instructed for her to discuss that at the office visit to clarify.

## 2018-05-23 ENCOUNTER — Encounter: Payer: Self-pay | Admitting: Psychiatry

## 2018-05-23 ENCOUNTER — Ambulatory Visit (INDEPENDENT_AMBULATORY_CARE_PROVIDER_SITE_OTHER): Payer: 59 | Admitting: Psychiatry

## 2018-05-23 ENCOUNTER — Other Ambulatory Visit: Payer: Self-pay

## 2018-05-23 DIAGNOSIS — F419 Anxiety disorder, unspecified: Secondary | ICD-10-CM

## 2018-05-23 DIAGNOSIS — F3181 Bipolar II disorder: Secondary | ICD-10-CM

## 2018-05-23 DIAGNOSIS — F900 Attention-deficit hyperactivity disorder, predominantly inattentive type: Secondary | ICD-10-CM | POA: Diagnosis not present

## 2018-05-23 MED ORDER — CITALOPRAM HYDROBROMIDE 20 MG PO TABS: ORAL_TABLET | ORAL | 1 refills | Status: DC

## 2018-05-23 MED ORDER — ALPRAZOLAM 0.5 MG PO TABS: 0.5000 mg | ORAL_TABLET | Freq: Three times a day (TID) | ORAL | 2 refills | Status: DC | PRN

## 2018-05-23 MED ORDER — TRAZODONE HCL 100 MG PO TABS: ORAL_TABLET | ORAL | 0 refills | Status: DC

## 2018-05-23 MED ORDER — LISDEXAMFETAMINE DIMESYLATE 60 MG PO CAPS: 60.0000 mg | ORAL_CAPSULE | ORAL | 0 refills | Status: DC

## 2018-05-23 MED ORDER — VORTIOXETINE HBR 10 MG PO TABS: 10.0000 mg | ORAL_TABLET | Freq: Every day | ORAL | 0 refills | Status: DC

## 2018-05-23 MED ORDER — LAMOTRIGINE 150 MG PO TABS: 225.0000 mg | ORAL_TABLET | Freq: Every day | ORAL | 2 refills | Status: DC

## 2018-05-23 MED ORDER — LURASIDONE HCL 60 MG PO TABS: ORAL_TABLET | ORAL | 2 refills | Status: DC

## 2018-05-23 NOTE — Progress Notes (Signed)
Tabitha Bennett [id number] 1965/12/03 53 y.o.  Subjective:   Patient ID:  Tabitha Bennett is a 53 y.o. (DOB 02-16-1966) female.  Chief Complaint:  Chief Complaint  Patient presents with  . ADD  . Anxiety  . Depression    HPI Tabitha Bennett presents to the office today for follow-up of depression, anxiety, and attention deficit. She reports "I've been doing better" and noticed she felt better after she forgot to take Progesterone. She reports that she is now taking less Progesterone and notices some improvement in energy. She notices improved energy and motivation. She reports that her mood is "good except I get rattled easily."  Reports occasional depression- "it comes and goes really quickly." She reports having irritability with any missed doses of Vyvanse. She reports that she has had anxiety about her son who is having some difficulties. She reports that she had increased anxiety after receiving a warning letter about missed apts.  She reports that she has been forgetting appointments and going to apts on the wrong date and time. She reports that she missed an apt earlier today. She reports difficulty staying focused on one project or task. She reports slight improvement in concentration. She reports adequate sleep. Reports that her appetite has started to increase and thinks this is due to lowering progesterone dose. Denies SI.   Past medication trials: Celexa Lexapro Paxil Prozac Sertraline Effexor Wellbutrin Lamictal Vraylar-weight gain Latuda Rexulti-tremor Adderall XR Vyvanse Xanax Trazodone   Review of Systems:  Review of Systems  Respiratory: Positive for cough.   Cardiovascular: Negative for palpitations.  Musculoskeletal: Negative for gait problem.  Neurological:       Reports occasional mild tremor in right arm. She reports that she has been told she moves her legs often.   Psychiatric/Behavioral:       Please refer to HPI   She reports that she  had a chronic cough and reports that they installed a filtration system to help with black mold and her cough has improved. Reports that CXR was negative. She reports that she will start to cough now when someone mention of cough.    Medications: I have reviewed the patient's current medications.  Current Outpatient Medications  Medication Sig Dispense Refill  . ALPRAZolam (XANAX) 0.5 MG tablet Take 1 tablet (0.5 mg total) by mouth 3 (three) times daily as needed for up to 30 days for anxiety. 90 tablet 2  . Cholecalciferol (VITAMIN D3) 5000 units CAPS Take by mouth.    . estradiol (ESTRACE) 0.5 MG tablet Take 0.5 mg by mouth at bedtime.   2  . lamoTRIgine (LAMICTAL) 150 MG tablet Take 1.5 tablets (225 mg total) by mouth daily. 45 tablet 2  . lisdexamfetamine (VYVANSE) 60 MG capsule Take 1 capsule (60 mg total) by mouth every morning. 30 capsule 0  . lisdexamfetamine (VYVANSE) 60 MG capsule Take 1 capsule (60 mg total) by mouth daily for 30 days. 30 capsule 0  . [START ON 06/02/2018] lisdexamfetamine (VYVANSE) 60 MG capsule Take 1 capsule (60 mg total) by mouth every morning. 30 capsule 0  . Lurasidone HCl (LATUDA) 60 MG TABS Take 1/2-1 tab po qd 30 tablet 2  . Omega-3 Fatty Acids (FISH OIL) 1000 MG CAPS Take by mouth.    . progesterone (PROMETRIUM) 200 MG capsule Take 100 mg by mouth at bedtime.     . traZODone (DESYREL) 100 MG tablet TAKE 1.5-2 TABLETS BY MOUTH AT BEDTIME 180 tablet 0  . vitamin k 100 MCG tablet  Take 100 mcg by mouth daily.    . citalopram (CELEXA) 20 MG tablet Take 1/2-1 tab po qd 30 tablet 1  . vortioxetine HBr (TRINTELLIX) 10 MG TABS tablet Take 1 tablet (10 mg total) by mouth daily. 28 tablet 0   No current facility-administered medications for this visit.     Medication Side Effects: Other: Sexual side effects  Allergies: No Known Allergies  Past Medical History:  Diagnosis Date  . Bipolar 1 disorder (HCC)     No family history on file.  Social History    Socioeconomic History  . Marital status: Married    Spouse name: Not on file  . Number of children: Not on file  . Years of education: Not on file  . Highest education level: Not on file  Occupational History  . Not on file  Social Needs  . Financial resource strain: Not on file  . Food insecurity:    Worry: Not on file    Inability: Not on file  . Transportation needs:    Medical: Not on file    Non-medical: Not on file  Tobacco Use  . Smoking status: Never Smoker  . Smokeless tobacco: Never Used  Substance and Sexual Activity  . Alcohol use: Yes    Alcohol/week: 1.0 standard drinks    Types: 1 Glasses of wine per week    Comment: 1 glass of wine per week  . Drug use: No  . Sexual activity: Not on file  Lifestyle  . Physical activity:    Days per week: Not on file    Minutes per session: Not on file  . Stress: Not on file  Relationships  . Social connections:    Talks on phone: Not on file    Gets together: Not on file    Attends religious service: Not on file    Active member of club or organization: Not on file    Attends meetings of clubs or organizations: Not on file    Relationship status: Not on file  . Intimate partner violence:    Fear of current or ex partner: Not on file    Emotionally abused: Not on file    Physically abused: Not on file    Forced sexual activity: Not on file  Other Topics Concern  . Not on file  Social History Narrative  . Not on file    Past Medical History, Surgical history, Social history, and Family history were reviewed and updated as appropriate.   Please see review of systems for further details on the patient's review from today.   Objective:   Physical Exam:  BP 97/63   Pulse 72   Physical Exam Constitutional:      General: She is not in acute distress.    Appearance: She is well-developed.  Musculoskeletal:        General: No deformity.  Neurological:     Mental Status: She is alert and oriented to person,  place, and time.     Coordination: Coordination normal.  Psychiatric:        Attention and Perception: Perception normal. She does not perceive auditory or visual hallucinations.        Mood and Affect: Mood is anxious. Mood is not depressed. Affect is not labile, blunt, angry or inappropriate.        Speech: Speech normal.        Behavior: Behavior normal.        Thought Content: Thought content normal. Thought  content does not include homicidal or suicidal ideation. Thought content does not include homicidal or suicidal plan.        Cognition and Memory: Cognition is impaired. Memory is impaired.        Judgment: Judgment normal.     Comments: Insight intact. No delusions.      Lab Review:     Component Value Date/Time   NA 138 05/03/2016 1542   K 3.8 05/03/2016 1542   CL 104 05/03/2016 1542   CO2 26 05/03/2016 1542   GLUCOSE 100 (H) 05/03/2016 1542   BUN 13 05/03/2016 1542   CREATININE 1.02 (H) 05/03/2016 1542   CALCIUM 9.2 05/03/2016 1542   PROT 6.0 11/25/2010 0942   ALBUMIN 3.0 (L) 11/25/2010 0942   AST 21 11/25/2010 0942   ALT 18 11/25/2010 0942   ALKPHOS 43 11/25/2010 0942   BILITOT 0.2 (L) 11/25/2010 0942   GFRNONAA >60 05/03/2016 1542   GFRAA >60 05/03/2016 1542       Component Value Date/Time   WBC 6.6 05/03/2016 1542   RBC 4.09 05/03/2016 1542   HGB 12.8 05/03/2016 1542   HCT 39.5 05/03/2016 1542   PLT 279 05/03/2016 1542   MCV 96.6 05/03/2016 1542   MCH 31.3 05/03/2016 1542   MCHC 32.4 05/03/2016 1542   RDW 12.3 05/03/2016 1542    Lithium Lvl  Date Value Ref Range Status  04/22/2016 <0.06 (L) 0.60 - 1.20 mmol/L Final     No results found for: PHENYTOIN, PHENOBARB, VALPROATE, CBMZ   .res Assessment: Plan:   Discussed patient's concerns about possible sexual side effects with Celexa.  Discussed potential benefits, risk, and side effects of Trintellix.  Patient reports some anxiety with stopping Celexa.  Discussed that possible option would be to  decrease Celexa to 20 mg 1/2 tablet daily until next visit, and then starting Trintellix 5 mg daily for 1 week, and then increasing to 10 mg daily to improve mood and possibly cognitive processing speed.  Patient provided with samples of Trintellix. Continue Vyvanse 60 mg daily for attention deficit. Continue Latuda 60 mg daily for mood disorder. Continue lamotrigine for mood stabilization. Continue trazodone as needed for insomnia. Continue alprazolam as needed for anxiety. ADHD, predominantly inattentive type - Plan: lisdexamfetamine (VYVANSE) 60 MG capsule  Anxiety disorder, unspecified type - Plan: citalopram (CELEXA) 20 MG tablet, vortioxetine HBr (TRINTELLIX) 10 MG TABS tablet, ALPRAZolam (XANAX) 0.5 MG tablet  Bipolar II disorder (HCC) - Plan: citalopram (CELEXA) 20 MG tablet, vortioxetine HBr (TRINTELLIX) 10 MG TABS tablet, lamoTRIgine (LAMICTAL) 150 MG tablet, Lurasidone HCl (LATUDA) 60 MG TABS, traZODone (DESYREL) 100 MG tablet  Please see After Visit Summary for patient specific instructions.  Future Appointments  Date Time Provider Department Center  06/18/2018 11:45 AM Corie Chiquito, PMHNP [facility] None    No orders of the defined types were placed in this encounter.     -------------------------------

## 2018-06-03 IMAGING — CT CT HEAD W/O CM
3 of 4 series · 14 of 47 positions shown, 16 images · non-contrast
Comparison: None.

CLINICAL DATA: Lower extremity weakness, ataxia

EXAM:
CT HEAD WITHOUT CONTRAST
TECHNIQUE: Contiguous axial images were obtained from the base of the skull
through the vertex without intravenous contrast.

[Series 2: head w/o · axial · non-contrast · 0.45mm/px · z∈[-216,-91]mm · 8 of 31 slices shown, 10 images]
[im 3/31  brain]
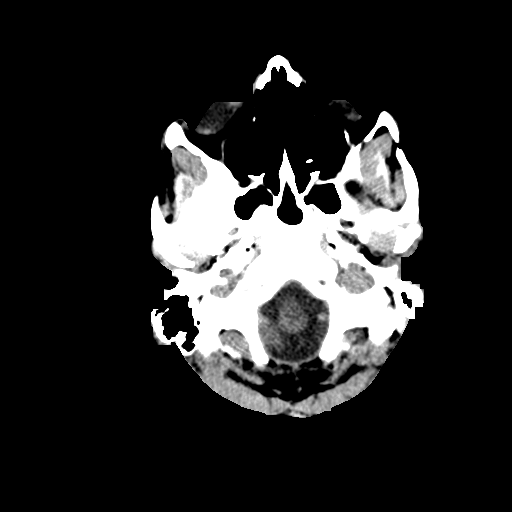
[im 3/31  bone]
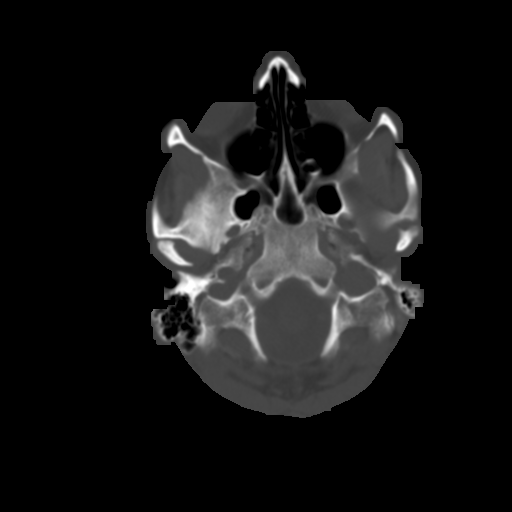
[im 7/31  brain]
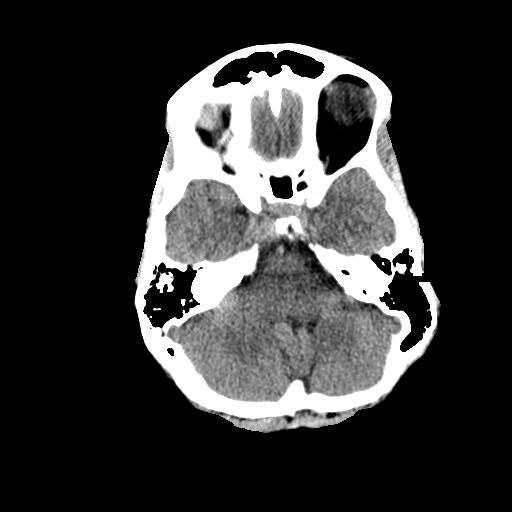
[im 11/31  brain]
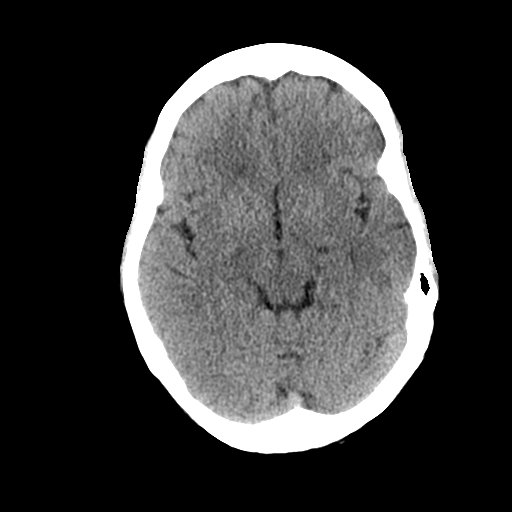
[im 13/31  brain]
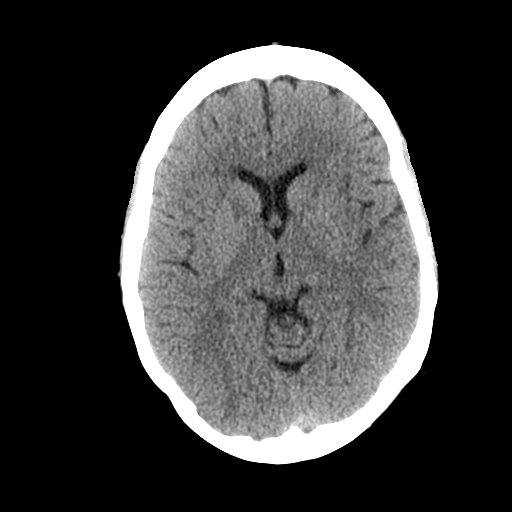
[im 18/31  brain]
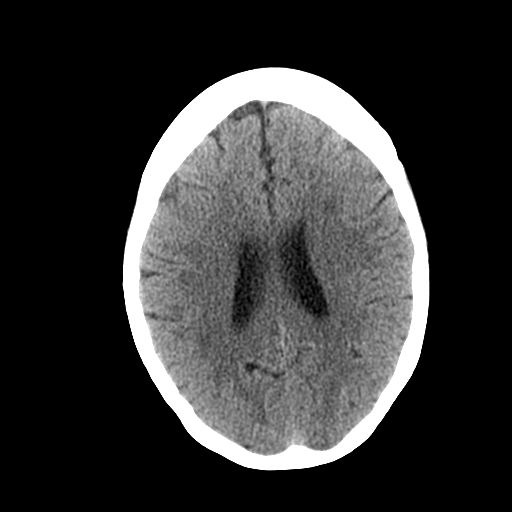
[im 18/31  bone]
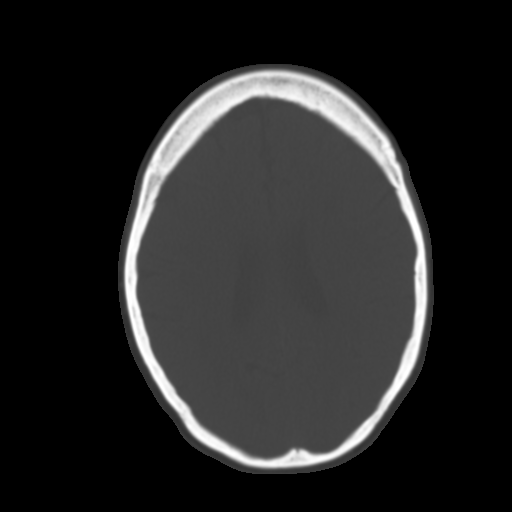
[im 20/31  brain]
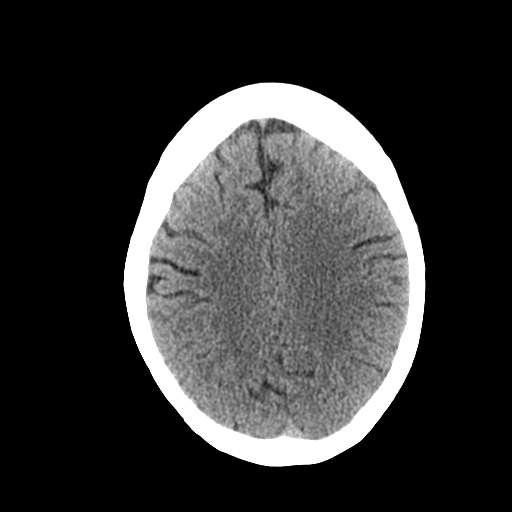
[im 24/31  brain]
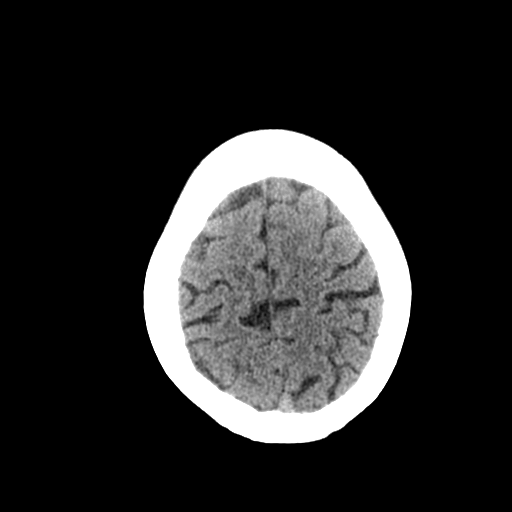
[im 28/31  brain]
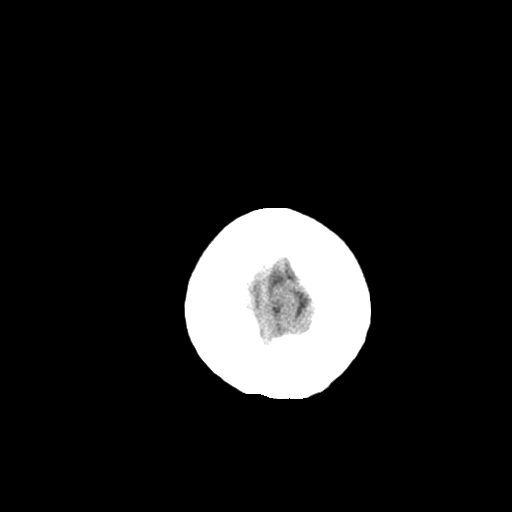

[Series 4: coronal · coronal · 0.30mm/px · 3 of 77 slices shown]
[im 26/77  brain]
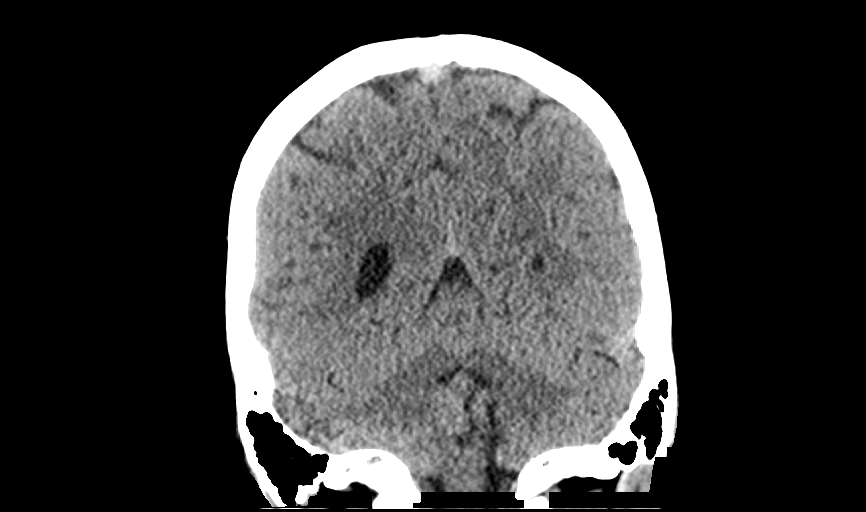
[im 34/77  brain]
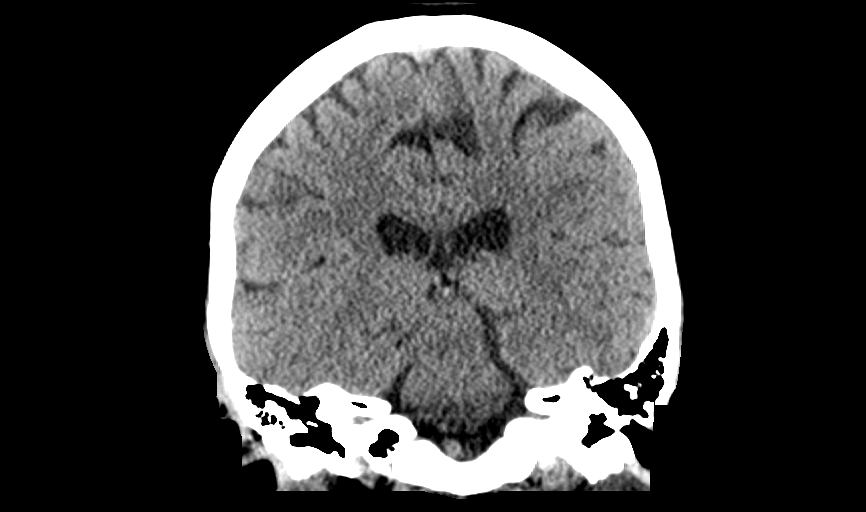
[im 43/77  brain]
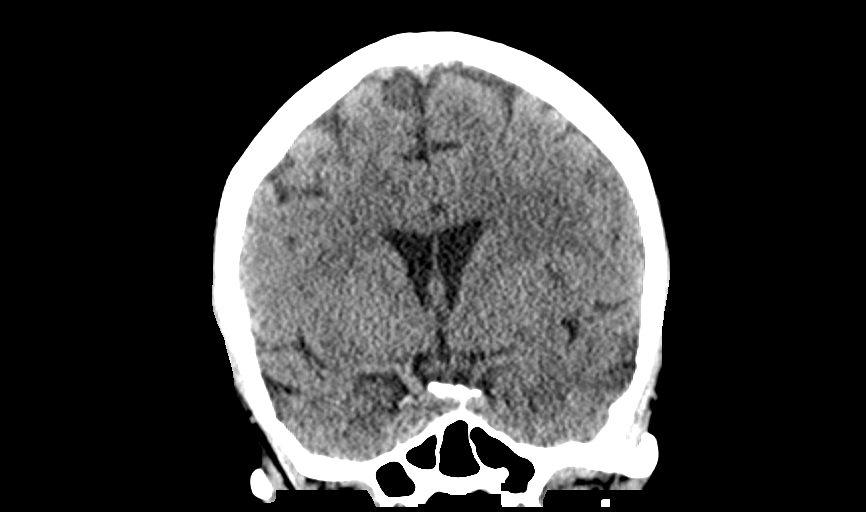

[Series 5: sagittal · sagittal · 0.30mm/px · 3 of 76 slices shown]
[im 26/76  brain]
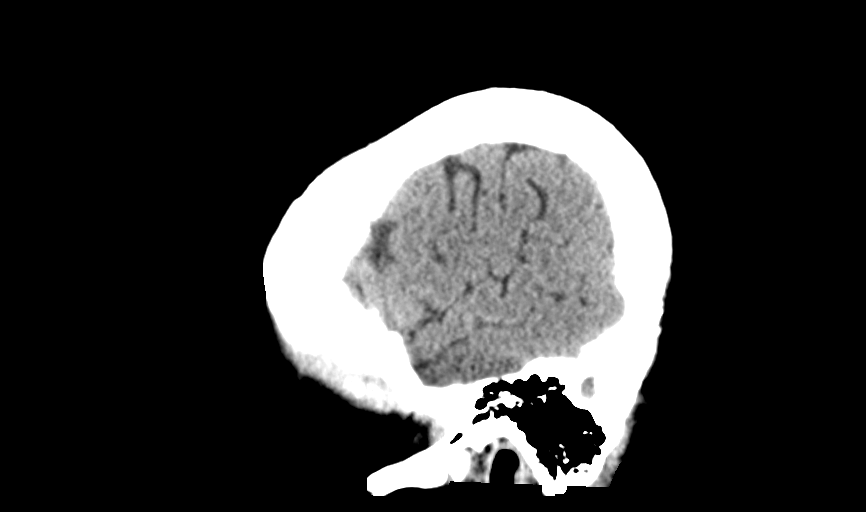
[im 38/76  brain]
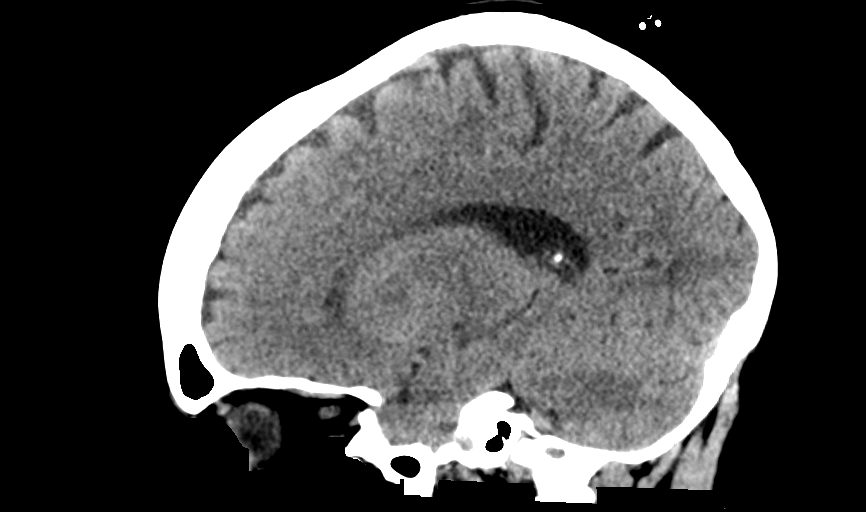
[im 51/76  brain]
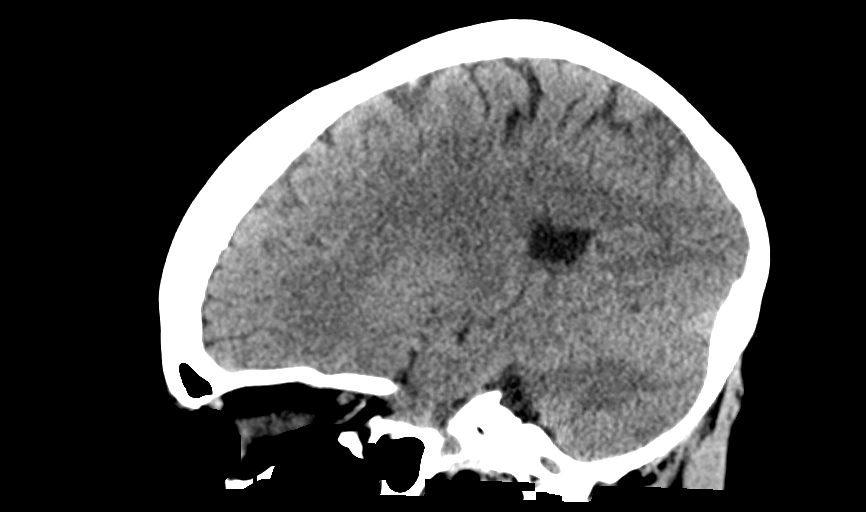

[14 of 47 positions shown; findings below may reference images not displayed]

FINDINGS: Brain: No evidence of acute infarction, hemorrhage, hydrocephalus,
extra-axial collection or mass lesion/mass effect.

Subcortical white matter and periventricular small vessel ischemic
changes.

Vascular: No hyperdense vessel or unexpected calcification.

Skull: Normal. Negative for fracture or focal lesion.

Sinuses/Orbits: No acute finding.

Other: None.
IMPRESSION: No evidence of acute intracranial abnormality.

Mild small vessel ischemic changes.

## 2018-06-04 ENCOUNTER — Ambulatory Visit: Payer: 59 | Admitting: Psychiatry

## 2018-06-17 ENCOUNTER — Telehealth: Payer: Self-pay | Admitting: Psychiatry

## 2018-06-17 NOTE — Telephone Encounter (Signed)
No she didn't need anything at this time

## 2018-06-17 NOTE — Telephone Encounter (Signed)
Patient scheduled appt., for 06/10 and she also wanted you to know that she did not stay on the Trimtellix because it makes her feel irritable.

## 2018-06-18 ENCOUNTER — Ambulatory Visit: Payer: 59 | Admitting: Psychiatry

## 2018-07-04 ENCOUNTER — Telehealth: Payer: Self-pay | Admitting: Psychiatry

## 2018-07-04 DIAGNOSIS — F900 Attention-deficit hyperactivity disorder, predominantly inattentive type: Secondary | ICD-10-CM

## 2018-07-04 MED ORDER — LISDEXAMFETAMINE DIMESYLATE 60 MG PO CAPS: 60.0000 mg | ORAL_CAPSULE | Freq: Every day | ORAL | 0 refills | Status: DC

## 2018-07-04 MED ORDER — LISDEXAMFETAMINE DIMESYLATE 60 MG PO CAPS: 60.0000 mg | ORAL_CAPSULE | ORAL | 0 refills | Status: DC

## 2018-07-04 NOTE — Telephone Encounter (Signed)
Pt called request refill Vyvanse 60 mg @ [facility] [address]. Pt ask for a few refills. March was last one.

## 2018-07-06 ENCOUNTER — Other Ambulatory Visit: Payer: Self-pay | Admitting: Psychiatry

## 2018-07-06 DIAGNOSIS — F419 Anxiety disorder, unspecified: Secondary | ICD-10-CM

## 2018-07-07 NOTE — Telephone Encounter (Signed)
Refill if appropriate .

## 2018-08-01 ENCOUNTER — Other Ambulatory Visit: Payer: Self-pay

## 2018-08-01 ENCOUNTER — Encounter: Payer: Self-pay | Admitting: Plastic Surgery

## 2018-08-01 ENCOUNTER — Ambulatory Visit (INDEPENDENT_AMBULATORY_CARE_PROVIDER_SITE_OTHER): Payer: Self-pay | Admitting: Plastic Surgery

## 2018-08-01 VITALS — BP 118/77 | HR 73 | Temp 98.3°F

## 2018-08-01 DIAGNOSIS — Z719 Counseling, unspecified: Secondary | ICD-10-CM

## 2018-08-01 NOTE — Progress Notes (Signed)
Botulinum Toxin Procedure Note  Procedure: Cosmetic botulinum toxin  Pre-operative Diagnosis: Dynamic rhytides   Post-operative Diagnosis: Same  Complications:  None  Brief history: The patient desires botulinum toxin injection of her forehead. I discussed with the patient this proposed procedure of botulinum toxin injections, which is customized depending on the particular needs of the patient. It is performed on facial rhytids as a temporary correction. The alternatives were discussed with the patient. The risks were addressed including bleeding, scarring, infection, damage to deeper structures, asymmetry, and chronic pain, which may occur infrequently after a procedure. The individual's choice to undergo a surgical procedure is based on the comparison of risks to potential benefits. Other risks include unsatisfactory results, brow ptosis, eyelid ptosis, allergic reaction, temporary paralysis, which should go away with time, bruising, blurring disturbances and delayed healing. Botulinum toxin injections do not arrest the aging process or produce permanent tightening of the eyelid.  Operative intervention maybe necessary to maintain the results of a blepharoplasty or botulinum toxin. The patient understands and wishes to proceed. An informed consent was signed and informational brochures given to her prior to the procedure.  Procedure: The area was prepped with alcohol and dried with a clean gauze. Using a clean technique, the botulinum toxin was diluted with 1.25 cc of preservative-free normal saline which was slowly injected with an 18 gauge needle in a tuberculin syringes.  A 32 gauge needles were then used to inject the botulinum toxin. This mixture allow for an aliquot of 5 units per 0.1 cc in each injection site.    Subsequently the mixture was injected in the glabellar and forehead area with preservation of the temporal branch to the lateral eyebrow as well as into each lateral canthal area  beginning from the lateral orbital rim medial to the zygomaticus major in 3 separate areas. A total of 30 Units of botulinum toxin was used. The forehead and glabellar area was injected with care to inject intramuscular only while holding pressure on the supratrochlear vessels in each area during each injection on either side of the medial corrugators. The injection proceeded vertically superiorly to the medial 2/3 of the frontalis muscle and superior 2/3 of the lateral frontalis, again with preservation of the frontal branch.  No complications were noted. Light pressure was held for 5 minutes. She was instructed explicitly in post-operative care.  Botox LOT:  [id number] EXP:  6/22  

## 2018-08-17 ENCOUNTER — Other Ambulatory Visit: Payer: Self-pay | Admitting: Psychiatry

## 2018-08-17 DIAGNOSIS — F419 Anxiety disorder, unspecified: Secondary | ICD-10-CM

## 2018-08-18 NOTE — Telephone Encounter (Signed)
Has appt 06/10

## 2018-08-19 ENCOUNTER — Telehealth: Payer: Self-pay | Admitting: Psychiatry

## 2018-08-19 NOTE — Telephone Encounter (Signed)
Patient canceled appt. For 06/10 requesting refill on [facility] [address]

## 2018-08-20 ENCOUNTER — Ambulatory Visit: Payer: 59 | Admitting: Psychiatry

## 2018-09-14 ENCOUNTER — Other Ambulatory Visit: Payer: Self-pay | Admitting: Psychiatry

## 2018-09-14 DIAGNOSIS — F419 Anxiety disorder, unspecified: Secondary | ICD-10-CM

## 2018-09-14 DIAGNOSIS — F3181 Bipolar II disorder: Secondary | ICD-10-CM

## 2018-09-22 ENCOUNTER — Other Ambulatory Visit: Payer: Self-pay | Admitting: Psychiatry

## 2018-09-22 DIAGNOSIS — F419 Anxiety disorder, unspecified: Secondary | ICD-10-CM

## 2018-09-22 NOTE — Telephone Encounter (Signed)
Still no appt rescheduled

## 2018-10-03 ENCOUNTER — Other Ambulatory Visit: Payer: Self-pay | Admitting: Psychiatry

## 2018-10-03 DIAGNOSIS — F419 Anxiety disorder, unspecified: Secondary | ICD-10-CM

## 2018-10-06 ENCOUNTER — Telehealth: Payer: Self-pay | Admitting: Psychiatry

## 2018-10-06 DIAGNOSIS — F419 Anxiety disorder, unspecified: Secondary | ICD-10-CM

## 2018-10-06 MED ORDER — BUSPIRONE HCL 15 MG PO TABS: ORAL_TABLET | ORAL | 0 refills | Status: DC

## 2018-10-06 NOTE — Telephone Encounter (Signed)
Received written request from pt to start Buspar for anxiety. Will send in script.

## 2018-10-06 NOTE — Telephone Encounter (Signed)
Scheduled appt 08/17 now

## 2018-10-06 NOTE — Telephone Encounter (Signed)
Tabitha Bennett called to request refill of her Vyvanse.  Said she is completely out.  Appt 10/27/18.  [facility] to [facility] [address]

## 2018-10-07 ENCOUNTER — Other Ambulatory Visit: Payer: Self-pay

## 2018-10-07 ENCOUNTER — Telehealth: Payer: Self-pay | Admitting: Psychiatry

## 2018-10-07 DIAGNOSIS — F900 Attention-deficit hyperactivity disorder, predominantly inattentive type: Secondary | ICD-10-CM

## 2018-10-07 MED ORDER — LISDEXAMFETAMINE DIMESYLATE 60 MG PO CAPS: 60.0000 mg | ORAL_CAPSULE | ORAL | 0 refills | Status: DC

## 2018-10-07 NOTE — Telephone Encounter (Signed)
Pt needs rx for [facility] [address]. Pt is in bad shape, can't get out of bed. Husband said she needs it right away. 3 month supply will be good.

## 2018-10-07 NOTE — Telephone Encounter (Signed)
Last fill vyvanse 06/26 Has appt 08/18

## 2018-10-14 ENCOUNTER — Other Ambulatory Visit: Payer: Self-pay | Admitting: Psychiatry

## 2018-10-14 DIAGNOSIS — F3181 Bipolar II disorder: Secondary | ICD-10-CM

## 2018-10-14 DIAGNOSIS — F419 Anxiety disorder, unspecified: Secondary | ICD-10-CM

## 2018-10-14 NOTE — Telephone Encounter (Signed)
Submitted last month for 30 day, does have appt scheduled in a couple of weeks?

## 2018-10-15 ENCOUNTER — Other Ambulatory Visit: Payer: Self-pay | Admitting: Psychiatry

## 2018-10-15 DIAGNOSIS — F3181 Bipolar II disorder: Secondary | ICD-10-CM

## 2018-10-27 ENCOUNTER — Other Ambulatory Visit: Payer: Self-pay

## 2018-10-27 ENCOUNTER — Encounter: Payer: Self-pay | Admitting: Psychiatry

## 2018-10-27 ENCOUNTER — Ambulatory Visit (INDEPENDENT_AMBULATORY_CARE_PROVIDER_SITE_OTHER): Payer: 59 | Admitting: Psychiatry

## 2018-10-27 DIAGNOSIS — F419 Anxiety disorder, unspecified: Secondary | ICD-10-CM

## 2018-10-27 DIAGNOSIS — F900 Attention-deficit hyperactivity disorder, predominantly inattentive type: Secondary | ICD-10-CM | POA: Diagnosis not present

## 2018-10-27 DIAGNOSIS — F3181 Bipolar II disorder: Secondary | ICD-10-CM | POA: Diagnosis not present

## 2018-10-27 MED ORDER — LAMOTRIGINE 150 MG PO TABS: 225.0000 mg | ORAL_TABLET | Freq: Every day | ORAL | 2 refills | Status: DC

## 2018-10-27 MED ORDER — TRAZODONE HCL 100 MG PO TABS: ORAL_TABLET | ORAL | 1 refills | Status: DC

## 2018-10-27 MED ORDER — LISDEXAMFETAMINE DIMESYLATE 60 MG PO CAPS: 60.0000 mg | ORAL_CAPSULE | ORAL | 0 refills | Status: DC

## 2018-10-27 MED ORDER — BUSPIRONE HCL 15 MG PO TABS: ORAL_TABLET | ORAL | 2 refills | Status: DC

## 2018-10-27 MED ORDER — LISDEXAMFETAMINE DIMESYLATE 60 MG PO CAPS: 60.0000 mg | ORAL_CAPSULE | Freq: Every day | ORAL | 0 refills | Status: DC

## 2018-10-27 MED ORDER — ALPRAZOLAM 0.5 MG PO TABS: 0.5000 mg | ORAL_TABLET | Freq: Three times a day (TID) | ORAL | 2 refills | Status: DC | PRN

## 2018-10-27 MED ORDER — LATUDA 60 MG PO TABS: ORAL_TABLET | ORAL | 2 refills | Status: DC

## 2018-10-27 NOTE — Progress Notes (Signed)
Tabitha Bennett [id number] 1965/03/18 53 y.o.  Subjective:   Patient ID:  Tabitha Bennett is a 53 y.o. (DOB 1965/03/18) female.  Chief Complaint:  Chief Complaint  Patient presents with  . Follow-up    h/o Anxiety, Depression, ADD    HPI Tabitha Bennett presents to the office today for follow-up of depression, anxiety, and ADD. Since last visit, pt had contacted office and requested to start Buspar since she would like to decrease or d/c Xanax due to concerns about cognitive issues. She reports some benefit with Buspar but has not been able to tolerate doses higher than 5 mg. She reports that she noticed increased irritability at 10 mg. She feels that "anxiety is much more under control." She reports, "my mood has been good." Reports occ depression which seems to be situational and short in duration and not severe. She reports that she was unable to get out of bed for 2 days when she did not have Vyvanse. Notices increased depression when she does not have Vyvanse. She reports that Vyvanse is helpful for her concentration. Reports energy and motivation are ok. Reports that she typically enjoys more challenging activities, ie. Prefers hiking to walking around the neighborhood. She reports adequate sleep most nights and averages about 7 hours a night. She reports appetite has been ok and typically eats 2 meals daily. Denies any recent impulsive or risky behavior. Denies SI.   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 She reports that she would like to do more and have more constructive activities. Recently went hiking in [place].   Typically taking Xanax 0.5 mg po BID.  Past medication trials: Celexa Lexapro Paxil Prozac Sertraline Effexor Trintellix Wellbutrin Lamictal Vraylar-weight gain Latuda Rexulti-tremor Adderall XR Vyvanse Xanax Trazodone  Review of Systems:  Review of Systems  Cardiovascular: Negative for palpitations.  Musculoskeletal: Negative  for gait problem.  Neurological: Negative for tremors.  Psychiatric/Behavioral:       Please refer to HPI    Medications: I have reviewed the patient's current medications.  Current Outpatient Medications  Medication Sig Dispense Refill  . busPIRone (BUSPAR) 15 MG tablet Take 1/3-2/3  tablet p.o. twice daily 60 tablet 2  . Cholecalciferol (VITAMIN D3) 5000 units CAPS Take by mouth.    . citalopram (CELEXA) 20 MG tablet TAKE 1/2 TO 1 TABLET BY MOUTH DAILY 90 tablet 1  . estradiol (ESTRACE) 0.5 MG tablet Take 0.5 mg by mouth at bedtime.   2  . lamoTRIgine (LAMICTAL) 150 MG tablet Take 1.5 tablets (225 mg total) by mouth daily. 45 tablet 2  . [START ON 12/30/2018] lisdexamfetamine (VYVANSE) 60 MG capsule Take 1 capsule (60 mg total) by mouth every morning. 30 capsule 0  . [START ON 12/02/2018] lisdexamfetamine (VYVANSE) 60 MG capsule Take 1 capsule (60 mg total) by mouth every morning. 30 capsule 0  . Lurasidone HCl (LATUDA) 60 MG TABS Take 1/2-1 tab po qd 30 tablet 2  . Omega-3 Fatty Acids (FISH OIL) 1000 MG CAPS Take by mouth.    . traZODone (DESYREL) 100 MG tablet TAKE 1.5-2 TABLETS BY MOUTH AT BEDTIME 180 tablet 1  . vitamin k 100 MCG tablet Take 100 mcg by mouth daily.    Melene Muller. [START ON 11/03/2018] ALPRAZolam (XANAX) 0.5 MG tablet Take 1 tablet (0.5 mg total) by mouth 3 (three) times daily as needed. 90 tablet 2  . [START ON 11/04/2018] lisdexamfetamine (VYVANSE) 60 MG capsule Take 1 capsule (60 mg total) by mouth daily. 30 capsule  0  . lisdexamfetamine (VYVANSE) 60 MG capsule Take 1 capsule (60 mg total) by mouth every morning. 5 capsule 0  . progesterone (PROMETRIUM) 200 MG capsule Take 100 mg by mouth at bedtime.      No current facility-administered medications for this visit.     Medication Side Effects: None  Allergies: No Known Allergies  Past Medical History:  Diagnosis Date  . Bipolar 1 disorder ([place])     History reviewed. No pertinent family history.  Social History    Socioeconomic History  . Marital status: Married    Spouse name: Not on file  . Number of children: Not on file  . Years of education: Not on file  . Highest education level: Not on file  Occupational History  . Not on file  Social Needs  . Financial resource strain: Not on file  . Food insecurity    Worry: Not on file    Inability: Not on file  . Transportation needs    Medical: Not on file    Non-medical: Not on file  Tobacco Use  . Smoking status: Never Smoker  . Smokeless tobacco: Never Used  Substance and Sexual Activity  . Alcohol use: Yes    Alcohol/week: 1.0 standard drinks    Types: 1 Glasses of wine per week    Comment: 1 glass of wine per week  . Drug use: No  . Sexual activity: Not on file  Lifestyle  . Physical activity    Days per week: Not on file    Minutes per session: Not on file  . Stress: Not on file  Relationships  . Social Herbalist on phone: Not on file    Gets together: Not on file    Attends religious service: Not on file    Active member of club or organization: Not on file    Attends meetings of clubs or organizations: Not on file    Relationship status: Not on file  . Intimate partner violence    Fear of current or ex partner: Not on file    Emotionally abused: Not on file    Physically abused: Not on file    Forced sexual activity: Not on file  Other Topics Concern  . Not on file  Social History Narrative  . Not on file    Past Medical History, Surgical history, Social history, and Family history were reviewed and updated as appropriate.   Please see review of systems for further details on the patient's review from today.   Objective:   Physical Exam:  BP 107/75   Pulse 79   Physical Exam Constitutional:      General: She is not in acute distress.    Appearance: She is well-developed.  Musculoskeletal:        General: No deformity.  Neurological:     Mental Status: She is alert and oriented to person,  place, and time.     Coordination: Coordination normal.  Psychiatric:        Attention and Perception: Attention and perception normal. She does not perceive auditory or visual hallucinations.        Mood and Affect: Mood normal. Mood is not anxious or depressed. Affect is not labile, blunt, angry or inappropriate.        Speech: Speech normal.        Behavior: Behavior normal.        Thought Content: Thought content normal. Thought content does not include homicidal  or suicidal ideation. Thought content does not include homicidal or suicidal plan.        Cognition and Memory: Cognition and memory normal.        Judgment: Judgment normal.     Comments: Insight intact. No delusions.      Lab Review:     Component Value Date/Time   NA 138 05/03/2016 1542   K 3.8 05/03/2016 1542   CL 104 05/03/2016 1542   CO2 26 05/03/2016 1542   GLUCOSE 100 (H) 05/03/2016 1542   BUN 13 05/03/2016 1542   CREATININE 1.02 (H) 05/03/2016 1542   CALCIUM 9.2 05/03/2016 1542   PROT 6.0 11/25/2010 0942   ALBUMIN 3.0 (L) 11/25/2010 0942   AST 21 11/25/2010 0942   ALT 18 11/25/2010 0942   ALKPHOS 43 11/25/2010 0942   BILITOT 0.2 (L) 11/25/2010 0942   GFRNONAA >60 05/03/2016 1542   GFRAA >60 05/03/2016 1542       Component Value Date/Time   WBC 6.6 05/03/2016 1542   RBC 4.09 05/03/2016 1542   HGB 12.8 05/03/2016 1542   HCT 39.5 05/03/2016 1542   PLT 279 05/03/2016 1542   MCV 96.6 05/03/2016 1542   MCH 31.3 05/03/2016 1542   MCHC 32.4 05/03/2016 1542   RDW 12.3 05/03/2016 1542    Lithium Lvl  Date Value Ref Range Status  04/22/2016 <0.06 (L) 0.60 - 1.20 mmol/L Final     No results found for: PHENYTOIN, PHENOBARB, VALPROATE, CBMZ   .res Assessment: Plan:   Will continue current plan of care.  Discussed how to gradually decrease alprazolam to minimize any withdrawal signs and symptoms so that patient can continue lowest possible effective dose. Discussed that follow-up every 3 months is  recommended since patient is on 2 controlled substances and some medications with potentially higher risk of adverse effects.  Encourage patient to keep scheduled follow-up appointments. Patient advised to contact office with any questions, adverse effects, or acute worsening in signs and symptoms.  Tabitha Bennett was seen today for follow-up.  Diagnoses and all orders for this visit:  Anxiety disorder, unspecified type -     ALPRAZolam (XANAX) 0.5 MG tablet; Take 1 tablet (0.5 mg total) by mouth 3 (three) times daily as needed. -     busPIRone (BUSPAR) 15 MG tablet; Take 1/3-2/3  tablet p.o. twice daily  Bipolar II disorder (HCC) -     lamoTRIgine (LAMICTAL) 150 MG tablet; Take 1.5 tablets (225 mg total) by mouth daily. -     Lurasidone HCl (LATUDA) 60 MG TABS; Take 1/2-1 tab po qd -     traZODone (DESYREL) 100 MG tablet; TAKE 1.5-2 TABLETS BY MOUTH AT BEDTIME  ADHD, predominantly inattentive type -     lisdexamfetamine (VYVANSE) 60 MG capsule; Take 1 capsule (60 mg total) by mouth every morning. -     lisdexamfetamine (VYVANSE) 60 MG capsule; Take 1 capsule (60 mg total) by mouth every morning. -     lisdexamfetamine (VYVANSE) 60 MG capsule; Take 1 capsule (60 mg total) by mouth daily. -     lisdexamfetamine (VYVANSE) 60 MG capsule; Take 1 capsule (60 mg total) by mouth every morning.     Please see After Visit Summary for patient specific instructions.  Future Appointments  Date Time Provider Department Center  01/27/2019 10:30 AM Corie Chiquitoarter, Alder Murri, PMHNP [facility] None    No orders of the defined types were placed in this encounter.   -------------------------------

## 2018-10-29 ENCOUNTER — Other Ambulatory Visit: Payer: Self-pay | Admitting: Family Medicine

## 2018-10-29 DIAGNOSIS — Z1231 Encounter for screening mammogram for malignant neoplasm of breast: Secondary | ICD-10-CM

## 2018-11-07 ENCOUNTER — Encounter: Payer: Self-pay | Admitting: Plastic Surgery

## 2018-11-07 ENCOUNTER — Ambulatory Visit (INDEPENDENT_AMBULATORY_CARE_PROVIDER_SITE_OTHER): Payer: Self-pay | Admitting: Plastic Surgery

## 2018-11-07 ENCOUNTER — Other Ambulatory Visit: Payer: Self-pay

## 2018-11-07 VITALS — BP 113/70 | HR 74 | Temp 98.5°F

## 2018-11-07 DIAGNOSIS — Z719 Counseling, unspecified: Secondary | ICD-10-CM

## 2018-11-07 NOTE — Progress Notes (Signed)
Botulinum Toxin and Filler Injection Procedure Note  Procedure: Cosmetic botulinum toxin and Filler administration  Pre-operative Diagnosis: Dynamic rhytides and midface volume loss  Post-operative Diagnosis: Same  Complications:  None  Brief history: The patient desires botulinum toxin injection of her forehead. I discussed with the patient this proposed procedure of botulinum toxin injections, which is customized depending on the particular needs of the patient. It is performed on facial rhytids as a temporary correction. The alternatives were discussed with the patient. The risks were addressed including bleeding, scarring, infection, damage to deeper structures, asymmetry, and chronic pain, which may occur infrequently after a procedure. The individual's choice to undergo a surgical procedure is based on the comparison of risks to potential benefits. Other risks include unsatisfactory results, brow ptosis, eyelid ptosis, allergic reaction, temporary paralysis, which should go away with time, bruising, blurring disturbances and delayed healing. Botulinum toxin injections do not arrest the aging process or produce permanent tightening of the eyelid.  Operative intervention maybe necessary to maintain the results of a blepharoplasty or botulinum toxin. The patient understands and wishes to proceed. An informed consent was signed and informational brochures given to her prior to the procedure.  Procedure: The area was prepped with alcohol and dried with a clean gauze. Using a clean technique, the botulinum toxin was diluted with 1.25 cc of preservative-free normal saline which was slowly injected with an 18 gauge needle in a tuberculin syringes.  A 32 gauge needles were then used to inject the botulinum toxin. This mixture allow for an aliquot of 5 units per 0.1 cc in each injection site.    Subsequently the mixture was injected in the lateral eyebrow as well as into each lateral canthal area.  A total  of 10 Units of botulinum toxin was used.   The midface area was injected at the 3 sub-regions of the mid-face: zygomaticomalar region, anteromedial cheek region, and submalar region for a total of one syringe on each side of the face. The technique used was serial puncture with equal injections in the 3 sub-regions: the zygomaticomalar region, the anteromedial cheek, and the submalar region.  No complications were noted. Light pressure was held for 5 minutes. She was instructed explicitly in post-operative care.  Botox LOT:  [id number] EXP:  1/23  Restylane Lyft two LOT: [id number] EXP: 2020-10-09

## 2018-11-21 ENCOUNTER — Other Ambulatory Visit: Payer: Self-pay | Admitting: Psychiatry

## 2018-11-21 DIAGNOSIS — F419 Anxiety disorder, unspecified: Secondary | ICD-10-CM

## 2018-12-15 ENCOUNTER — Other Ambulatory Visit: Payer: Self-pay

## 2018-12-15 ENCOUNTER — Ambulatory Visit
Admission: RE | Admit: 2018-12-15 | Discharge: 2018-12-15 | Disposition: A | Discharge status: Home / Self Care | Payer: 59 | Source: Ambulatory Visit | Attending: Family Medicine | Admitting: Family Medicine

## 2018-12-15 DIAGNOSIS — Z1231 Encounter for screening mammogram for malignant neoplasm of breast: Secondary | ICD-10-CM

## 2018-12-16 ENCOUNTER — Other Ambulatory Visit: Payer: Self-pay | Admitting: Family Medicine

## 2018-12-16 DIAGNOSIS — R928 Other abnormal and inconclusive findings on diagnostic imaging of breast: Secondary | ICD-10-CM

## 2018-12-19 ENCOUNTER — Other Ambulatory Visit: Payer: 59

## 2018-12-24 ENCOUNTER — Ambulatory Visit
Admission: RE | Admit: 2018-12-24 | Discharge: 2018-12-24 | Disposition: A | Discharge status: Home / Self Care | Payer: 59 | Source: Ambulatory Visit | Attending: Family Medicine | Admitting: Family Medicine

## 2018-12-24 ENCOUNTER — Other Ambulatory Visit: Payer: Self-pay

## 2018-12-24 DIAGNOSIS — R928 Other abnormal and inconclusive findings on diagnostic imaging of breast: Secondary | ICD-10-CM

## 2019-01-01 ENCOUNTER — Telehealth: Payer: Self-pay | Admitting: Psychiatry

## 2019-01-01 NOTE — Telephone Encounter (Signed)
Pt insistent that she will need PA for Vyvanse and wants it done before Oct 26. She has [employer].and the info is correct in Epic.  [facility] [address]

## 2019-01-02 NOTE — Telephone Encounter (Signed)
Clarification her PA expires 02/05/2019, Insurance won't allow a PA submitted till next month.

## 2019-01-02 NOTE — Telephone Encounter (Signed)
Will submit a new PA, her's does expire on 01/05/2019

## 2019-01-02 NOTE — Telephone Encounter (Signed)
Left patient voicemail with information and to call back with further concerns 

## 2019-01-20 ENCOUNTER — Encounter: Payer: Self-pay | Admitting: Psychiatry

## 2019-01-20 ENCOUNTER — Ambulatory Visit (INDEPENDENT_AMBULATORY_CARE_PROVIDER_SITE_OTHER): Payer: 59 | Admitting: Psychiatry

## 2019-01-20 ENCOUNTER — Other Ambulatory Visit: Payer: Self-pay

## 2019-01-20 DIAGNOSIS — F419 Anxiety disorder, unspecified: Secondary | ICD-10-CM | POA: Diagnosis not present

## 2019-01-20 DIAGNOSIS — F900 Attention-deficit hyperactivity disorder, predominantly inattentive type: Secondary | ICD-10-CM

## 2019-01-20 DIAGNOSIS — F3181 Bipolar II disorder: Secondary | ICD-10-CM | POA: Diagnosis not present

## 2019-01-20 MED ORDER — LAMOTRIGINE 150 MG PO TABS: 225.0000 mg | ORAL_TABLET | Freq: Every day | ORAL | 0 refills | Status: DC

## 2019-01-20 MED ORDER — LATUDA 60 MG PO TABS: ORAL_TABLET | ORAL | 2 refills | Status: DC

## 2019-01-20 MED ORDER — LISDEXAMFETAMINE DIMESYLATE 60 MG PO CAPS: 60.0000 mg | ORAL_CAPSULE | Freq: Every day | ORAL | 0 refills | Status: DC

## 2019-01-20 MED ORDER — CITALOPRAM HYDROBROMIDE 20 MG PO TABS: ORAL_TABLET | ORAL | 0 refills | Status: DC

## 2019-01-20 MED ORDER — LISDEXAMFETAMINE DIMESYLATE 60 MG PO CAPS: 60.0000 mg | ORAL_CAPSULE | ORAL | 0 refills | Status: DC

## 2019-01-20 NOTE — Progress Notes (Signed)
[facility] [place] [id number] 06/27/1965 53 y.o.  Subjective:   Patient ID:  Tabitha Bennett, Tabitha Bennett is a 53 y.o. (DOB [birth year]) female.  Chief Complaint:  Chief Complaint  Patient presents with  . Follow-up    ADD, anxiety, depression    HPI [facility] presents to the office today for follow-up of mood, anxiety, and ADD. She reports that she has been feeling well. She reports that her mood has been stable overall. She reports that she briefly tried to come off of Estradiol and Progesterone and had worsening irritability. Reports irritability has improved with re-starting hormones. Denies depressed mood. She reports that noticed some worsening anxiety and irritability with taking less Xanax. Now taking Xanax BID. Reports that this leads to lower motivation. Denies recent panic attacks. She reports adequate sleep. Appetite has been normal. Energy and motivation have been good. "I think my concentration is better right now." Denies SI.  Denies impulsive or risky behavior.   Has been working part-time with [employer] and is pursuing a similar opportunity.   Past medication trials: Celexa Lexapro Paxil Prozac Sertraline Effexor Trintellix Wellbutrin Lamictal Vraylar-weight gain Latuda Rexulti-tremor Adderall XR Vyvanse Xanax Trazodone Buspar  Review of Systems:  Review of Systems  Cardiovascular: Negative for palpitations.  Gastrointestinal: Negative.   Musculoskeletal: Negative for gait problem.  Neurological: Negative for tremors and headaches.  Psychiatric/Behavioral:       Please refer to HPI    Medications: I have reviewed the patient's current medications.  Current Outpatient Medications  Medication Sig Dispense Refill  . ALPRAZolam (XANAX) 0.5 MG tablet Take 1 tablet (0.5 mg total) by mouth 3 (three) times daily as needed. 90 tablet 2  . Cholecalciferol (VITAMIN D3) 5000 units CAPS Take by mouth.    . citalopram (CELEXA) 20 MG tablet  TAKE 1/2 TO 1 TABLET BY MOUTH DAILY 90 tablet 0  . estradiol (ESTRACE) 0.5 MG tablet Take 0.5 mg by mouth at bedtime.   2  . lamoTRIgine (LAMICTAL) 150 MG tablet Take 1.5 tablets (225 mg total) by mouth daily. 135 tablet 0  . [START ON 03/27/2019] lisdexamfetamine (VYVANSE) 60 MG capsule Take 1 capsule (60 mg total) by mouth every morning. 30 capsule 0  . [START ON 02/27/2019] lisdexamfetamine (VYVANSE) 60 MG capsule Take 1 capsule (60 mg total) by mouth every morning. 30 capsule 0  . Omega-3 Fatty Acids (FISH OIL) 1000 MG CAPS Take by mouth.    . progesterone (PROMETRIUM) 200 MG capsule Take 100 mg by mouth at bedtime.     . traZODone (DESYREL) 100 MG tablet TAKE 1.5-2 TABLETS BY MOUTH AT BEDTIME 180 tablet 1  . vitamin k 100 MCG tablet Take 100 mcg by mouth daily.    Melene Muller ON 01/30/2019] lisdexamfetamine (VYVANSE) 60 MG capsule Take 1 capsule (60 mg total) by mouth daily. 30 capsule 0  . Lurasidone HCl (LATUDA) 60 MG TABS Take 1/2-1 tab po qd 30 tablet 2   No current facility-administered medications for this visit.     Medication Side Effects: None  Allergies: No Known Allergies  Past Medical History:  Diagnosis Date  . Bipolar 1 disorder (HCC)     Family History  Problem Relation Age of Onset  . Breast cancer Neg Hx     Social History   Socioeconomic History  . Marital status: Married    Spouse name: Not on file  . Number of children: Not on file  . Years of education: Not on file  . Highest  education level: Not on file  Occupational History  . Not on file  Social Needs  . Financial resource strain: Not on file  . Food insecurity    Worry: Not on file    Inability: Not on file  . Transportation needs    Medical: Not on file    Non-medical: Not on file  Tobacco Use  . Smoking status: Never Smoker  . Smokeless tobacco: Never Used  Substance and Sexual Activity  . Alcohol use: Yes    Alcohol/week: 1.0 standard drinks    Types: 1 Glasses of wine per week     Comment: 1 glass of wine per week  . Drug use: No  . Sexual activity: Not on file  Lifestyle  . Physical activity    Days per week: Not on file    Minutes per session: Not on file  . Stress: Not on file  Relationships  . Social Herbalist on phone: Not on file    Gets together: Not on file    Attends religious service: Not on file    Active member of club or organization: Not on file    Attends meetings of clubs or organizations: Not on file    Relationship status: Not on file  . Intimate partner violence    Fear of current or ex partner: Not on file    Emotionally abused: Not on file    Physically abused: Not on file    Forced sexual activity: Not on file  Other Topics Concern  . Not on file  Social History Narrative  . Not on file    Past Medical History, Surgical history, Social history, and Family history were reviewed and updated as appropriate.   Please see review of systems for further details on the patient's review from today.   Objective:   Physical Exam:  BP 101/68   Pulse 77   Physical Exam Constitutional:      General: She is not in acute distress.    Appearance: She is well-developed.  Musculoskeletal:        General: No deformity.  Neurological:     Mental Status: She is alert and oriented to person, place, and time.     Coordination: Coordination normal.  Psychiatric:        Attention and Perception: Attention and perception normal. She does not perceive auditory or visual hallucinations.        Mood and Affect: Mood normal. Mood is not anxious or depressed. Affect is not labile, blunt, angry or inappropriate.        Speech: Speech normal.        Behavior: Behavior normal.        Thought Content: Thought content normal. Thought content does not include homicidal or suicidal ideation. Thought content does not include homicidal or suicidal plan.        Cognition and Memory: Cognition and memory normal.        Judgment: Judgment normal.      Comments: Insight intact. No delusions.      Lab Review:     Component Value Date/Time   NA 138 05/03/2016 1542   K 3.8 05/03/2016 1542   CL 104 05/03/2016 1542   CO2 26 05/03/2016 1542   GLUCOSE 100 (H) 05/03/2016 1542   BUN 13 05/03/2016 1542   CREATININE 1.02 (H) 05/03/2016 1542   CALCIUM 9.2 05/03/2016 1542   PROT 6.0 11/25/2010 0942   ALBUMIN 3.0 (L) 11/25/2010 0932  AST 21 11/25/2010 0942   ALT 18 11/25/2010 0942   ALKPHOS 43 11/25/2010 0942   BILITOT 0.2 (L) 11/25/2010 0942   GFRNONAA >60 05/03/2016 1542   GFRAA >60 05/03/2016 1542       Component Value Date/Time   WBC 6.6 05/03/2016 1542   RBC 4.09 05/03/2016 1542   HGB 12.8 05/03/2016 1542   HCT 39.5 05/03/2016 1542   PLT 279 05/03/2016 1542   MCV 96.6 05/03/2016 1542   MCH 31.3 05/03/2016 1542   MCHC 32.4 05/03/2016 1542   RDW 12.3 05/03/2016 1542    Lithium Lvl  Date Value Ref Range Status  04/22/2016 <0.06 (L) 0.60 - 1.20 mmol/L Final     No results found for: PHENYTOIN, PHENOBARB, VALPROATE, CBMZ   .res Assessment: Plan:   Will continue current plan of care since target signs and symptoms are well controlled without any tolerability issues. Patient to follow-up in 3 months or sooner if clinically indicated. Patient advised to contact office with any questions, adverse effects, or acute worsening in signs and symptoms.  Salem Casterhomasina was seen today for follow-up.  Diagnoses and all orders for this visit:  ADHD, predominantly inattentive type -     lisdexamfetamine (VYVANSE) 60 MG capsule; Take 1 capsule (60 mg total) by mouth every morning. -     lisdexamfetamine (VYVANSE) 60 MG capsule; Take 1 capsule (60 mg total) by mouth every morning. -     lisdexamfetamine (VYVANSE) 60 MG capsule; Take 1 capsule (60 mg total) by mouth daily.  Anxiety disorder, unspecified type -     citalopram (CELEXA) 20 MG tablet; TAKE 1/2 TO 1 TABLET BY MOUTH DAILY  Bipolar II disorder (HCC) -     citalopram (CELEXA)  20 MG tablet; TAKE 1/2 TO 1 TABLET BY MOUTH DAILY -     lamoTRIgine (LAMICTAL) 150 MG tablet; Take 1.5 tablets (225 mg total) by mouth daily. -     Lurasidone HCl (LATUDA) 60 MG TABS; Take 1/2-1 tab po qd     Please see After Visit Summary for patient specific instructions.  Future Appointments  Date Time Provider Department Center  04/07/2019 10:00 AM Corie Chiquitoarter, Andera Cranmer, PMHNP CP-CP None    No orders of the defined types were placed in this encounter.   -------------------------------

## 2019-01-27 ENCOUNTER — Ambulatory Visit: Payer: 59 | Admitting: Psychiatry

## 2019-03-03 ENCOUNTER — Telehealth: Payer: Self-pay

## 2019-03-03 NOTE — Telephone Encounter (Signed)
Prior authorization submitted and approved for Vyvanse 60 mg capsules 1 daily effective 03/03/2019-03/02/2020 through [place] notified of approval

## 2019-03-31 ENCOUNTER — Other Ambulatory Visit: Payer: Self-pay | Admitting: Psychiatry

## 2019-03-31 DIAGNOSIS — F419 Anxiety disorder, unspecified: Secondary | ICD-10-CM

## 2019-04-01 NOTE — Telephone Encounter (Signed)
Apt 01/26 

## 2019-04-07 ENCOUNTER — Ambulatory Visit (INDEPENDENT_AMBULATORY_CARE_PROVIDER_SITE_OTHER): Payer: 59 | Admitting: Psychiatry

## 2019-04-07 ENCOUNTER — Encounter: Payer: Self-pay | Admitting: Psychiatry

## 2019-04-07 VITALS — HR 70 | Wt 134.0 lb

## 2019-04-07 DIAGNOSIS — F3181 Bipolar II disorder: Secondary | ICD-10-CM

## 2019-04-07 DIAGNOSIS — F900 Attention-deficit hyperactivity disorder, predominantly inattentive type: Secondary | ICD-10-CM

## 2019-04-07 DIAGNOSIS — F419 Anxiety disorder, unspecified: Secondary | ICD-10-CM

## 2019-04-07 MED ORDER — CITALOPRAM HYDROBROMIDE 20 MG PO TABS: ORAL_TABLET | ORAL | 0 refills | Status: DC

## 2019-04-07 MED ORDER — LAMOTRIGINE 150 MG PO TABS: 150.0000 mg | ORAL_TABLET | Freq: Every day | ORAL | 0 refills | Status: DC

## 2019-04-07 MED ORDER — LURASIDONE HCL 80 MG PO TABS: ORAL_TABLET | ORAL | 2 refills | Status: DC

## 2019-04-07 NOTE — Progress Notes (Signed)
[name] [id number] [birth year] 54 y.o.  Virtual Visit via Telephone Note  I connected with pt on 04/07/19 at 10:00 AM EST by telephone and verified that I am speaking with the correct person using two identifiers.   I discussed the limitations, risks, security and privacy concerns of performing an evaluation and management service by telephone and the availability of in person appointments. I also discussed with the patient that there may be a patient responsible charge related to this service. The patient expressed understanding and agreed to proceed.   I discussed the assessment and treatment plan with the patient. The patient was provided an opportunity to ask questions and all were answered. The patient agreed with the plan and demonstrated an understanding of the instructions.   The patient was advised to call back or seek an in-person evaluation if the symptoms worsen or if the condition fails to improve as anticipated.  I provided 30 minutes of non-face-to-face time during this encounter.  The patient was located at home.  The provider was located at [facility].   Corie Chiquito, PMHNP   Subjective:   Patient ID:  Tabitha Bennett is a 54 y.o. (DOB Jan 15, 1966) female.  Chief Complaint:  Chief Complaint  Patient presents with  . Follow-up    Anxiety, Depression, ADD    HPI Tabitha Bennett presents for follow-up of ADD, mood disturbance, and anxiety. She reports that she is now taking Latuda 40 mg and feels that this is helpful for unwanted thoughts. She reports that she continues to take Xanax BID prn and was taking TID prn during the holidays. Reports that she will feel overwhelmed when Xanax wears off and has difficulty starting things. Denies any recent panic attacks. She reports that she had some depression prior to increasing Latuda to 40 mg po qd. Denies current depression. Has been swimming 4-5 times a week and thinks this is  helpful for her. She reports that she will leave something at the swim club almost every time she goes, such as her swim suit and ear buds. She reports that she is able to focus on one thing at a time and has difficulty when she has to track several things. She reports that she has been snoring since she started Trazodone. She reports that she sleeps well. Reports that she has had some weight gain since increase in [place] despite low appetite and food intake. Energy and motivation have been good. Denies SI.   Past medication trials: Celexa Lexapro Paxil Prozac Sertraline Effexor Trintellix Wellbutrin Lamictal Vraylar-weight gain Latuda Rexulti-tremor Adderall XR Vyvanse Xanax Trazodone Buspar   Review of Systems:  Review of Systems  Cardiovascular: Negative for palpitations.  Musculoskeletal: Negative for gait problem.  Neurological: Negative for tremors.       Reports that occ her whole body will shake when she lays down at night.   Psychiatric/Behavioral:       Please refer to HPI    Medications: I have reviewed the patient's current medications.  Current Outpatient Medications  Medication Sig Dispense Refill  . ALPRAZolam (XANAX) 0.5 MG tablet TAKE 1 TABLET (0.5 MG TOTAL) BY MOUTH 3 (THREE) TIMES DAILY AS NEEDED. 90 tablet 2  . Cholecalciferol (VITAMIN D3) 5000 units CAPS Take by mouth.    . citalopram (CELEXA) 20 MG tablet TAKE 1/2 TO 1 TABLET BY MOUTH DAILY 90 tablet 0  . estradiol (ESTRACE) 0.5 MG tablet Take 0.5 mg by mouth at bedtime.   2  . lamoTRIgine (  LAMICTAL) 150 MG tablet Take 1 tablet (150 mg total) by mouth daily. 90 tablet 0  . lisdexamfetamine (VYVANSE) 60 MG capsule Take 1 capsule (60 mg total) by mouth every morning. 30 capsule 0  . lisdexamfetamine (VYVANSE) 60 MG capsule Take 1 capsule (60 mg total) by mouth every morning. 30 capsule 0  . Omega-3 Fatty Acids (FISH OIL) 1000 MG CAPS Take by mouth.    . progesterone (PROMETRIUM) 200 MG capsule Take 100  mg by mouth at bedtime.     . traZODone (DESYREL) 100 MG tablet TAKE 1.5-2 TABLETS BY MOUTH AT BEDTIME 180 tablet 1  . lisdexamfetamine (VYVANSE) 60 MG capsule Take 1 capsule (60 mg total) by mouth daily. 30 capsule 0  . lurasidone (LATUDA) 80 MG TABS tablet Take 1/2-1 tab po qd with a meal. 30 tablet 2   No current facility-administered medications for this visit.    Medication Side Effects: Other: Snoring with Trazodone. Wt. gain with Latuda.   Allergies: No Known Allergies  Past Medical History:  Diagnosis Date  . Bipolar 1 disorder ([place])     Family History  Problem Relation Age of Onset  . Breast cancer Neg Hx     Social History   Socioeconomic History  . Marital status: Married    Spouse name: Not on file  . Number of children: Not on file  . Years of education: Not on file  . Highest education level: Not on file  Occupational History  . Not on file  Tobacco Use  . Smoking status: Never Smoker  . Smokeless tobacco: Never Used  Substance and Sexual Activity  . Alcohol use: Yes    Alcohol/week: 1.0 standard drinks    Types: 1 Glasses of wine per week    Comment: 1 glass of wine per week  . Drug use: No  . Sexual activity: Not on file  Other Topics Concern  . Not on file  Social History Narrative  . Not on file   Social Determinants of Health   Financial Resource Strain:   . Difficulty of Paying Living Expenses: Not on file  Food Insecurity:   . Worried About Charity fundraiser in the Last Year: Not on file  . Ran Out of Food in the Last Year: Not on file  Transportation Needs:   . Lack of Transportation (Medical): Not on file  . Lack of Transportation (Non-Medical): Not on file  Physical Activity:   . Days of Exercise per Week: Not on file  . Minutes of Exercise per Session: Not on file  Stress:   . Feeling of Stress : Not on file  Social Connections:   . Frequency of Communication with Friends and Family: Not on file  . Frequency of Social  Gatherings with Friends and Family: Not on file  . Attends Religious Services: Not on file  . Active Member of Clubs or Organizations: Not on file  . Attends Archivist Meetings: Not on file  . Marital Status: Not on file  Intimate Partner Violence:   . Fear of Current or Ex-Partner: Not on file  . Emotionally Abused: Not on file  . Physically Abused: Not on file  . Sexually Abused: Not on file    Past Medical History, Surgical history, Social history, and Family history were reviewed and updated as appropriate.   Please see review of systems for further details on the patient's review from today.   Objective:   Physical Exam:  Pulse 70  Wt 134 lb (60.8 kg)   BMI 21.63 kg/m   Physical Exam Neurological:     Mental Status: She is alert and oriented to person, place, and time.     Cranial Nerves: No dysarthria.  Psychiatric:        Attention and Perception: Attention and perception normal.        Mood and Affect: Mood is anxious.        Speech: Speech normal.        Behavior: Behavior is cooperative.        Thought Content: Thought content normal. Thought content is not paranoid or delusional. Thought content does not include homicidal or suicidal ideation. Thought content does not include homicidal or suicidal plan.        Cognition and Memory: Cognition and memory normal.        Judgment: Judgment normal.     Comments: Insight intact     Lab Review:     Component Value Date/Time   NA 138 05/03/2016 1542   K 3.8 05/03/2016 1542   CL 104 05/03/2016 1542   CO2 26 05/03/2016 1542   GLUCOSE 100 (H) 05/03/2016 1542   BUN 13 05/03/2016 1542   CREATININE 1.02 (H) 05/03/2016 1542   CALCIUM 9.2 05/03/2016 1542   PROT 6.0 11/25/2010 0942   ALBUMIN 3.0 (L) 11/25/2010 0942   AST 21 11/25/2010 0942   ALT 18 11/25/2010 0942   ALKPHOS 43 11/25/2010 0942   BILITOT 0.2 (L) 11/25/2010 0942   GFRNONAA >60 05/03/2016 1542   GFRAA >60 05/03/2016 1542       Component  Value Date/Time   WBC 6.6 05/03/2016 1542   RBC 4.09 05/03/2016 1542   HGB 12.8 05/03/2016 1542   HCT 39.5 05/03/2016 1542   PLT 279 05/03/2016 1542   MCV 96.6 05/03/2016 1542   MCH 31.3 05/03/2016 1542   MCHC 32.4 05/03/2016 1542   RDW 12.3 05/03/2016 1542    Lithium Lvl  Date Value Ref Range Status  04/22/2016 <0.06 (L) 0.60 - 1.20 mmol/L Final     No results found for: PHENYTOIN, PHENOBARB, VALPROATE, CBMZ   .res Assessment: Plan:   Pt seen for 30 minutes and discussed increasing Latuda to 40 mg po qd since she reports that this dose is more effective for depression and intrusive thoughts. Discussed that Tabitha Bennett is considered low risk for wt gain and that typically wt gain with atypical antipsychotics will plateau after a few months. Pt reports that she would like to continue Latuda 40 mg po qd.  Discussed strategies to help with inattention and losing objects. Will continue all other medications as prescribed.  Pt reports that she prefers printed Vyvanse prescriptions and will p/u next Vyvanse script.  Pt to f/u in 3 months or sooner if clinically indicated.  Patient advised to contact office with any questions, adverse effects, or acute worsening in signs and symptoms.  Oral was seen today for follow-up.  Diagnoses and all orders for this visit:  Anxiety disorder, unspecified type -     citalopram (CELEXA) 20 MG tablet; TAKE 1/2 TO 1 TABLET BY MOUTH DAILY  Bipolar II disorder (HCC) -     lurasidone (LATUDA) 80 MG TABS tablet; Take 1/2-1 tab po qd with a meal. -     citalopram (CELEXA) 20 MG tablet; TAKE 1/2 TO 1 TABLET BY MOUTH DAILY -     lamoTRIgine (LAMICTAL) 150 MG tablet; Take 1 tablet (150 mg total) by mouth daily.  ADHD, predominantly inattentive type    Please see After Visit Summary for patient specific instructions.  Future Appointments  Date Time Provider Department Center  07/06/2019 10:00 AM Corie Chiquito, PMHNP CP-CP None    No orders of the  defined types were placed in this encounter.     -------------------------------

## 2019-04-08 ENCOUNTER — Telehealth: Payer: Self-pay | Admitting: Psychiatry

## 2019-04-08 DIAGNOSIS — F900 Attention-deficit hyperactivity disorder, predominantly inattentive type: Secondary | ICD-10-CM

## 2019-04-08 MED ORDER — LISDEXAMFETAMINE DIMESYLATE 60 MG PO CAPS: 60.0000 mg | ORAL_CAPSULE | ORAL | 0 refills | Status: DC

## 2019-04-08 MED ORDER — LISDEXAMFETAMINE DIMESYLATE 60 MG PO CAPS: 60.0000 mg | ORAL_CAPSULE | Freq: Every day | ORAL | 0 refills | Status: DC

## 2019-04-08 NOTE — Telephone Encounter (Signed)
Vyvanse scripts printed for pt

## 2019-04-28 ENCOUNTER — Telehealth: Payer: Self-pay | Admitting: Psychiatry

## 2019-04-28 NOTE — Telephone Encounter (Signed)
Pt saying that her insurance is only paying $100 for her Vyvanse. She wants to know if you knew of another way for her to get it at a discount.

## 2019-04-30 NOTE — Telephone Encounter (Signed)
Tried to reach patient about her medication, unable to leave message, mail box full.

## 2019-05-01 NOTE — Telephone Encounter (Signed)
Spoke with patient and she hasn't had a chance to call insurance but said she will today and leave a message on how to proceed.

## 2019-05-17 ENCOUNTER — Other Ambulatory Visit: Payer: Self-pay | Admitting: Psychiatry

## 2019-05-17 DIAGNOSIS — F3181 Bipolar II disorder: Secondary | ICD-10-CM

## 2019-05-18 ENCOUNTER — Telehealth: Payer: Self-pay | Admitting: Psychiatry

## 2019-05-18 NOTE — Telephone Encounter (Signed)
Left message to call back  

## 2019-05-18 NOTE — Telephone Encounter (Signed)
Pt LM requesting the nurse call her concernig some insurance issues she spoke to you about.

## 2019-05-19 NOTE — Telephone Encounter (Signed)
Left patient message to follow up on her medication

## 2019-06-12 ENCOUNTER — Other Ambulatory Visit: Payer: Self-pay | Admitting: Psychiatry

## 2019-06-12 DIAGNOSIS — F3181 Bipolar II disorder: Secondary | ICD-10-CM

## 2019-06-19 ENCOUNTER — Ambulatory Visit: Payer: 59 | Attending: Internal Medicine

## 2019-06-19 DIAGNOSIS — Z23 Encounter for immunization: Secondary | ICD-10-CM

## 2019-06-19 NOTE — Progress Notes (Signed)
   Covid-19 Vaccination Clinic  Name:  Odilia Damico    MRN: [id number] DOB: 05/27/1965  06/19/2019  Ms. Ciccarelli was observed post Covid-19 immunization for 15 minutes without incident. She was provided with Vaccine Information Sheet and instruction to access the V-Safe system.   Ms. Degeorge was instructed to call 911 with any severe reactions post vaccine: Marland Kitchen Difficulty breathing  . Swelling of face and throat  . A fast heartbeat  . A bad rash all over body  . Dizziness and weakness   Immunizations Administered    Name Date Dose VIS Date Route   Pfizer COVID-19 Vaccine 06/19/2019 10:01 AM 0.3 mL 02/20/2019 Intramuscular   Manufacturer: [employer], [employer]   Lot: [id number]   NDC: [address]-8

## 2019-07-06 ENCOUNTER — Other Ambulatory Visit: Payer: Self-pay

## 2019-07-06 ENCOUNTER — Ambulatory Visit (INDEPENDENT_AMBULATORY_CARE_PROVIDER_SITE_OTHER): Payer: 59 | Admitting: Psychiatry

## 2019-07-06 ENCOUNTER — Encounter: Payer: Self-pay | Admitting: Psychiatry

## 2019-07-06 DIAGNOSIS — F3181 Bipolar II disorder: Secondary | ICD-10-CM | POA: Diagnosis not present

## 2019-07-06 DIAGNOSIS — F900 Attention-deficit hyperactivity disorder, predominantly inattentive type: Secondary | ICD-10-CM

## 2019-07-06 DIAGNOSIS — F419 Anxiety disorder, unspecified: Secondary | ICD-10-CM | POA: Diagnosis not present

## 2019-07-06 MED ORDER — LISDEXAMFETAMINE DIMESYLATE 60 MG PO CAPS: 60.0000 mg | ORAL_CAPSULE | ORAL | 0 refills | Status: DC

## 2019-07-06 MED ORDER — LURASIDONE HCL 60 MG PO TABS: ORAL_TABLET | ORAL | 2 refills | Status: DC

## 2019-07-06 MED ORDER — CITALOPRAM HYDROBROMIDE 20 MG PO TABS: ORAL_TABLET | ORAL | 1 refills | Status: DC

## 2019-07-06 MED ORDER — ALPRAZOLAM 0.5 MG PO TABS: 0.5000 mg | ORAL_TABLET | Freq: Three times a day (TID) | ORAL | 2 refills | Status: DC | PRN

## 2019-07-06 MED ORDER — LISDEXAMFETAMINE DIMESYLATE 60 MG PO CAPS: 60.0000 mg | ORAL_CAPSULE | Freq: Every day | ORAL | 0 refills | Status: DC

## 2019-07-06 MED ORDER — TRAZODONE HCL 100 MG PO TABS: ORAL_TABLET | ORAL | 1 refills | Status: DC

## 2019-07-06 MED ORDER — LAMOTRIGINE 150 MG PO TABS: 150.0000 mg | ORAL_TABLET | Freq: Every day | ORAL | 1 refills | Status: DC

## 2019-07-06 NOTE — Progress Notes (Signed)
Tabitha Bennett [id number] Jul 10, 1965 54 y.o.  Subjective:   Patient ID:  Tabitha Bennett is a 54 y.o. (DOB June 21, 1965) female.  Chief Complaint:  Chief Complaint  Patient presents with  . Follow-up    Anxiety, Depression, Insomnia    HPI Tabitha Bennett presents to the office today for follow-up of mood, anxiety, and ADD. "I've been well." She reports that she reduced Latuda to 30 mg po qd. She reports that she has been swimming regularly and finds this helpful for her mental health. She reports that in the past she would walk excessively and no longer enjoys this. She reports that she has been trying to lose a few lbs and has had difficulty with this. She reports that her appetite has been stable. She reports that she had depression for about a week after her covid injection. She reports that also felt tired and achy and had insomnia. Her sister had a very similar reaction. She reports that her mood has been stable since that time. Anxiety has been "low." Motivation is "great right now." Started playing piano. Will practice piano 2-4 hours a day. Sleeping well. Concentration has been ok and thinks that practicing piano is helping with concentration. Denies impulsive or risky behaviors. Denies SI.   Reports that she is typically taking Xanax prn BID. AIMS     Office Visit from 07/06/2019 in [facility]  AIMS Total Score  0       Review of Systems:  Review of Systems  Musculoskeletal: Negative for gait problem.       Had cortisone injection in her thumb for carpal tunnel. Reports that her husband commented that she was more irritable for about a week after the injection.   Neurological: Negative for tremors.  Psychiatric/Behavioral:       Please refer to HPI    Medications: I have reviewed the patient's current medications.  Current Outpatient Medications  Medication Sig Dispense Refill  . Cholecalciferol (VITAMIN D3) 5000 units CAPS Take by  mouth.    . estradiol (ESTRACE) 0.5 MG tablet Take 0.5 mg by mouth at bedtime.   2  . Omega-3 Fatty Acids (FISH OIL) 1000 MG CAPS Take by mouth.    . progesterone (PROMETRIUM) 200 MG capsule Take 100 mg by mouth at bedtime.     . ALPRAZolam (XANAX) 0.5 MG tablet Take 1 tablet (0.5 mg total) by mouth 3 (three) times daily as needed. 90 tablet 2  . citalopram (CELEXA) 20 MG tablet TAKE 1/2 TO 1 TABLET BY MOUTH DAILY 90 tablet 1  . lamoTRIgine (LAMICTAL) 150 MG tablet Take 1 tablet (150 mg total) by mouth daily. 90 tablet 1  . [START ON 09/18/2019] lisdexamfetamine (VYVANSE) 60 MG capsule Take 1 capsule (60 mg total) by mouth every morning. 30 capsule 0  . [START ON 08/21/2019] lisdexamfetamine (VYVANSE) 60 MG capsule Take 1 capsule (60 mg total) by mouth every morning. 30 capsule 0  . [START ON 07/24/2019] lisdexamfetamine (VYVANSE) 60 MG capsule Take 1 capsule (60 mg total) by mouth daily. 30 capsule 0  . Lurasidone HCl (LATUDA) 60 MG TABS Take 1/2-1 tab po qd with a meal. 30 tablet 2  . traZODone (DESYREL) 100 MG tablet TAKE 1 AND 1/2 TO 2 TABLETS BY MOUTH AT BEDTIME 180 tablet 1   No current facility-administered medications for this visit.    Medication Side Effects: None  Allergies: No Known Allergies  Past Medical History:  Diagnosis Date  . Bipolar 1  disorder [facility])     Family History  Problem Relation Age of Onset  . Breast cancer Neg Hx     Social History   Socioeconomic History  . Marital status: Married    Spouse name: Not on file  . Number of children: Not on file  . Years of education: Not on file  . Highest education level: Not on file  Occupational History  . Not on file  Tobacco Use  . Smoking status: Never Smoker  . Smokeless tobacco: Never Used  Substance and Sexual Activity  . Alcohol use: Yes    Alcohol/week: 1.0 standard drinks    Types: 1 Glasses of wine per week    Comment: 1 glass of wine per week  . Drug use: No  . Sexual activity: Not on file   Other Topics Concern  . Not on file  Social History Narrative  . Not on file   Social Determinants of Health   Financial Resource Strain:   . Difficulty of Paying Living Expenses:   Food Insecurity:   . Worried About Tabitha Bennett:   . Barista in the Last Bennett:   Transportation Needs:   . Freight forwarder (Medical):   Marland Kitchen Lack of Transportation (Non-Medical):   Physical Activity:   . Days of Exercise per Week:   . Minutes of Exercise per Session:   Stress:   . Feeling of Stress :   Social Connections:   . Frequency of Communication with Friends and Family:   . Frequency of Social Gatherings with Friends and Family:   . Attends Religious Services:   . Active Member of Clubs or Organizations:   . Attends Banker Meetings:   Marland Kitchen Marital Status:   Intimate Partner Violence:   . Fear of Current or Ex-Partner:   . Emotionally Abused:   Marland Kitchen Physically Abused:   . Sexually Abused:     Past Medical History, Surgical history, Social history, and Family history were reviewed and updated as appropriate.   Please see review of systems for further details on the patient's review from today.   Objective:   Physical Exam:  BP 115/71   Pulse 75   Physical Exam Constitutional:      General: She is not in acute distress. Musculoskeletal:        General: No deformity.  Neurological:     Mental Status: She is alert and oriented to person, place, and time.     Coordination: Coordination normal.  Psychiatric:        Attention and Perception: Attention and perception normal. She does not perceive auditory or visual hallucinations.        Mood and Affect: Mood normal. Mood is not anxious or depressed. Affect is not labile, blunt, angry or inappropriate.        Speech: Speech normal.        Behavior: Behavior normal.        Thought Content: Thought content normal. Thought content is not paranoid or delusional. Thought content does not  include homicidal or suicidal ideation. Thought content does not include homicidal or suicidal plan.        Cognition and Memory: Cognition and memory normal.        Judgment: Judgment normal.     Comments: Insight intact     Lab Review:     Component Value Date/Time   NA 138 05/03/2016 1542   K 3.8 05/03/2016  1542   CL 104 05/03/2016 1542   CO2 26 05/03/2016 1542   GLUCOSE 100 (H) 05/03/2016 1542   BUN 13 05/03/2016 1542   CREATININE 1.02 (H) 05/03/2016 1542   CALCIUM 9.2 05/03/2016 1542   PROT 6.0 11/25/2010 0942   ALBUMIN 3.0 (L) 11/25/2010 0942   AST 21 11/25/2010 0942   ALT 18 11/25/2010 0942   ALKPHOS 43 11/25/2010 0942   BILITOT 0.2 (L) 11/25/2010 0942   GFRNONAA >60 05/03/2016 1542   GFRAA >60 05/03/2016 1542       Component Value Date/Time   WBC 6.6 05/03/2016 1542   RBC 4.09 05/03/2016 1542   HGB 12.8 05/03/2016 1542   HCT 39.5 05/03/2016 1542   PLT 279 05/03/2016 1542   MCV 96.6 05/03/2016 1542   MCH 31.3 05/03/2016 1542   MCHC 32.4 05/03/2016 1542   RDW 12.3 05/03/2016 1542    Lithium Lvl  Date Value Ref Range Status  04/22/2016 <0.06 (L) 0.60 - 1.20 mmol/L Final     No results found for: PHENYTOIN, PHENOBARB, VALPROATE, CBMZ   .res Assessment: Plan:   Will continue current plan of care since target signs and symptoms are well controlled without any tolerability issues. Will change Latuda tabs to 60 mg since pt is currently taking 1/2 of a Latuda 60 mg tab. Will continue all other meds without changes.  Pt to f/u in 3 months or sooner if clinically indicated.  Patient advised to contact office with any questions, adverse effects, or acute worsening in signs and symptoms.  Tabitha was seen today for follow-up.  Diagnoses and all orders for this visit:  Bipolar II disorder (Max) -     Lurasidone HCl (LATUDA) 60 MG TABS; Take 1/2-1 tab po qd with a meal. -     citalopram (CELEXA) 20 MG tablet; TAKE 1/2 TO 1 TABLET BY MOUTH DAILY -      lamoTRIgine (LAMICTAL) 150 MG tablet; Take 1 tablet (150 mg total) by mouth daily. -     traZODone (DESYREL) 100 MG tablet; TAKE 1 AND 1/2 TO 2 TABLETS BY MOUTH AT BEDTIME  Anxiety disorder, unspecified type -     citalopram (CELEXA) 20 MG tablet; TAKE 1/2 TO 1 TABLET BY MOUTH DAILY -     ALPRAZolam (XANAX) 0.5 MG tablet; Take 1 tablet (0.5 mg total) by mouth 3 (three) times daily as needed.  ADHD, predominantly inattentive type -     lisdexamfetamine (VYVANSE) 60 MG capsule; Take 1 capsule (60 mg total) by mouth every morning. -     lisdexamfetamine (VYVANSE) 60 MG capsule; Take 1 capsule (60 mg total) by mouth every morning. -     lisdexamfetamine (VYVANSE) 60 MG capsule; Take 1 capsule (60 mg total) by mouth daily.     Please see After Visit Summary for patient specific instructions.  Future Appointments  Date Time Provider [place]  07/14/2019 10:00 AM [place]  09/28/2019 10:00 AM Thayer Headings, PMHNP CP-CP None    No orders of the defined types were placed in this encounter.   -------------------------------

## 2019-07-06 NOTE — Progress Notes (Signed)
   07/06/19 1033  Facial and Oral Movements  Muscles of Facial Expression 0  Lips and Perioral Area 0  Jaw 0  Tongue 0  Extremity Movements  Upper (arms, wrists, hands, fingers) 0  Lower (legs, knees, ankles, toes) 0  Trunk Movements  Neck, shoulders, hips 0  Overall Severity  Severity of abnormal movements (highest score from questions above) 0  Incapacitation due to abnormal movements 0  Patient's awareness of abnormal movements (rate only patient's report) 0  AIMS Total Score  AIMS Total Score 0

## 2019-07-14 ENCOUNTER — Ambulatory Visit: Payer: 59 | Attending: Internal Medicine

## 2019-07-14 DIAGNOSIS — Z23 Encounter for immunization: Secondary | ICD-10-CM

## 2019-07-14 NOTE — Progress Notes (Signed)
   Covid-19 Vaccination Clinic  Name:  Tabitha Bennett    MRN: [id number] DOB: 09-26-1965  07/14/2019  Tabitha Bennett was observed post Covid-19 immunization for 15 minutes without incident. She was provided with Vaccine Information Sheet and instruction to access the V-Safe system.   Tabitha Bennett was instructed to call 911 with any severe reactions post vaccine: Marland Kitchen Difficulty breathing  . Swelling of face and throat  . A fast heartbeat  . A bad rash all over body  . Dizziness and weakness   Immunizations Administered    Name Date Dose VIS Date Route   Pfizer COVID-19 Vaccine 07/14/2019 10:15 AM 0.3 mL 05/06/2018 Intramuscular   Manufacturer: [employer], [employer]   Lot: [id number]   NDC: [address]-3

## 2019-08-06 ENCOUNTER — Institutional Professional Consult (permissible substitution): Payer: 59 | Admitting: Plastic Surgery

## 2019-08-17 ENCOUNTER — Telehealth: Payer: Self-pay | Admitting: Psychiatry

## 2019-08-17 NOTE — Telephone Encounter (Signed)
Tabitha Bennett called and LM that she would like to talk to the nurse about her depression.

## 2019-09-28 ENCOUNTER — Ambulatory Visit (INDEPENDENT_AMBULATORY_CARE_PROVIDER_SITE_OTHER): Payer: 59 | Admitting: Psychiatry

## 2019-09-28 ENCOUNTER — Other Ambulatory Visit: Payer: Self-pay

## 2019-09-28 ENCOUNTER — Encounter: Payer: Self-pay | Admitting: Psychiatry

## 2019-09-28 DIAGNOSIS — F900 Attention-deficit hyperactivity disorder, predominantly inattentive type: Secondary | ICD-10-CM

## 2019-09-28 DIAGNOSIS — F419 Anxiety disorder, unspecified: Secondary | ICD-10-CM

## 2019-09-28 DIAGNOSIS — F3181 Bipolar II disorder: Secondary | ICD-10-CM | POA: Diagnosis not present

## 2019-09-28 MED ORDER — LAMOTRIGINE 150 MG PO TABS: 150.0000 mg | ORAL_TABLET | Freq: Every day | ORAL | 0 refills | Status: DC

## 2019-09-28 MED ORDER — LISDEXAMFETAMINE DIMESYLATE 60 MG PO CAPS: 60.0000 mg | ORAL_CAPSULE | ORAL | 0 refills | Status: DC

## 2019-09-28 MED ORDER — TRAZODONE HCL 100 MG PO TABS: ORAL_TABLET | ORAL | 0 refills | Status: DC

## 2019-09-28 MED ORDER — LATUDA 60 MG PO TABS: ORAL_TABLET | ORAL | 2 refills | Status: DC

## 2019-09-28 MED ORDER — CITALOPRAM HYDROBROMIDE 40 MG PO TABS: ORAL_TABLET | ORAL | 1 refills | Status: DC

## 2019-09-28 MED ORDER — ALPRAZOLAM 0.5 MG PO TABS: 0.5000 mg | ORAL_TABLET | Freq: Three times a day (TID) | ORAL | 2 refills | Status: DC | PRN

## 2019-09-28 MED ORDER — LISDEXAMFETAMINE DIMESYLATE 60 MG PO CAPS: 60.0000 mg | ORAL_CAPSULE | Freq: Every day | ORAL | 0 refills | Status: DC

## 2019-09-28 NOTE — Progress Notes (Signed)
[facility] Poth [id number] 1965/08/09 54 y.o.  Subjective:   Patient ID:  Tabitha Bennett Friesenhahn is a 54 y.o. (DOB 1965-09-26) female.  Chief Complaint:  Chief Complaint  Patient presents with  . Follow-up    Anxiety, h/o mood disturbance, insomnia, and ADD    HPI [facility] Bennett presents to the office today for follow-up of ADD, anxiety, and mood disturbance. She is accompanied by her grandson. She reports, "I've been great." She reports that her anxiety has been good. Has been drinking wine at night "to calm myself." Typically has 2 glasses of wine almost every night and reports that use has stayed the same. She reports that she sleeps well with Trazodone and is not able to sleep if she does not take Trazodone. Denies depressed mood. She reports that she had depression after both of her covid vaccinations. Denies irritability. Appetite has been good. Energy and motivation have been good. Concentration is ok. She reports that she has difficulty with her memory. Denies risky or impulsive behavior. Denies SI.   Volunteered for [employer] last week.   Past medication trials: Celexa Lexapro Paxil Prozac Sertraline Effexor Trintellix Wellbutrin Lamictal Vraylar-weight gain Latuda Rexulti-tremor Adderall XR Vyvanse Xanax Trazodone Buspar  AIMS     Office Visit from 09/28/2019 in [facility] Office Visit from 07/06/2019 in [facility]  AIMS Total Score 0 0       Review of Systems:  Review of Systems  Cardiovascular: Negative for palpitations.  Musculoskeletal: Negative for gait problem.  Neurological: Negative for tremors.  Psychiatric/Behavioral:       Please refer to HPI    Medications: I have reviewed the patient's current medications.  Current Outpatient Medications  Medication Sig Dispense Refill  . [START ON 10/26/2019] ALPRAZolam (XANAX) 0.5 MG tablet Take 1 tablet (0.5 mg total) by mouth 3 (three) times daily as  needed. 90 tablet 2  . Cholecalciferol (VITAMIN D3) 5000 units CAPS Take by mouth.    . citalopram (CELEXA) 40 MG tablet TAKE 1/2 TO 1 TABLET BY MOUTH DAILY 180 tablet 1  . estradiol (ESTRACE) 0.5 MG tablet Take 0.5 mg by mouth at bedtime.   2  . lamoTRIgine (LAMICTAL) 150 MG tablet Take 1 tablet (150 mg total) by mouth daily. 90 tablet 0  . [START ON 10/20/2019] lisdexamfetamine (VYVANSE) 60 MG capsule Take 1 capsule (60 mg total) by mouth every morning. 30 capsule 0  . [START ON 11/17/2019] lisdexamfetamine (VYVANSE) 60 MG capsule Take 1 capsule (60 mg total) by mouth every morning. 30 capsule 0  . [START ON 12/15/2019] lisdexamfetamine (VYVANSE) 60 MG capsule Take 1 capsule (60 mg total) by mouth daily. 30 capsule 0  . Lurasidone HCl (LATUDA) 60 MG TABS Take 1/2-1 tab po qd with a meal. 30 tablet 2  . Omega-3 Fatty Acids (FISH OIL) 1000 MG CAPS Take by mouth.    . progesterone (PROMETRIUM) 200 MG capsule Take 100 mg by mouth at bedtime.     . traZODone (DESYREL) 100 MG tablet TAKE 1 AND 1/2 TO 2 TABLETS BY MOUTH AT BEDTIME 180 tablet 0   No current facility-administered medications for this visit.    Medication Side Effects: None  Allergies: No Known Allergies  Past Medical History:  Diagnosis Date  . Bipolar 1 disorder (HCC)     Family History  Problem Relation Age of Onset  . Breast cancer Neg Hx     Social History   Socioeconomic History  . Marital status: Married  Spouse name: Not on file  . Number of children: Not on file  . Years of education: Not on file  . Highest education level: Not on file  Occupational History  . Not on file  Tobacco Use  . Smoking status: Never Smoker  . Smokeless tobacco: Never Used  Substance and Sexual Activity  . Alcohol use: Yes    Alcohol/week: 1.0 standard drink    Types: 1 Glasses of wine per week    Comment: 1 glass of wine per week  . Drug use: No  . Sexual activity: Not on file  Other Topics Concern  . Not on file  Social  History Narrative  . Not on file   Social Determinants of Health   Financial Resource Strain:   . Difficulty of Paying Living Expenses:   Food Insecurity:   . Worried About Programme researcher, broadcasting/film/video in the Last Year:   . Barista in the Last Year:   Transportation Needs:   . Freight forwarder (Medical):   Marland Kitchen Lack of Transportation (Non-Medical):   Physical Activity:   . Days of Exercise per Week:   . Minutes of Exercise per Session:   Stress:   . Feeling of Stress :   Social Connections:   . Frequency of Communication with Friends and Family:   . Frequency of Social Gatherings with Friends and Family:   . Attends Religious Services:   . Active Member of Clubs or Organizations:   . Attends Banker Meetings:   Marland Kitchen Marital Status:   Intimate Partner Violence:   . Fear of Current or Ex-Partner:   . Emotionally Abused:   Marland Kitchen Physically Abused:   . Sexually Abused:     Past Medical History, Surgical history, Social history, and Family history were reviewed and updated as appropriate.   Please see review of systems for further details on the patient's review from today.   Objective:   Physical Exam:  BP 123/87   Pulse 66   Physical Exam Constitutional:      General: She is not in acute distress. Musculoskeletal:        General: No deformity.  Neurological:     Mental Status: She is alert and oriented to person, place, and time.     Coordination: Coordination normal.  Psychiatric:        Attention and Perception: Attention and perception normal. She does not perceive auditory or visual hallucinations.        Mood and Affect: Mood normal. Mood is not anxious or depressed. Affect is not labile, blunt, angry or inappropriate.        Speech: Speech normal.        Behavior: Behavior normal.        Thought Content: Thought content normal. Thought content is not paranoid or delusional. Thought content does not include homicidal or suicidal ideation. Thought  content does not include homicidal or suicidal plan.        Cognition and Memory: Cognition and memory normal.        Judgment: Judgment normal.     Comments: Insight intact     Lab Review:     Component Value Date/Time   NA 138 05/03/2016 1542   K 3.8 05/03/2016 1542   CL 104 05/03/2016 1542   CO2 26 05/03/2016 1542   GLUCOSE 100 (H) 05/03/2016 1542   BUN 13 05/03/2016 1542   CREATININE 1.02 (H) 05/03/2016 1542   CALCIUM 9.2 05/03/2016  1542   PROT 6.0 11/25/2010 0942   ALBUMIN 3.0 (L) 11/25/2010 0942   AST 21 11/25/2010 0942   ALT 18 11/25/2010 0942   ALKPHOS 43 11/25/2010 0942   BILITOT 0.2 (L) 11/25/2010 0942   GFRNONAA >60 05/03/2016 1542   GFRAA >60 05/03/2016 1542       Component Value Date/Time   WBC 6.6 05/03/2016 1542   RBC 4.09 05/03/2016 1542   HGB 12.8 05/03/2016 1542   HCT 39.5 05/03/2016 1542   PLT 279 05/03/2016 1542   MCV 96.6 05/03/2016 1542   MCH 31.3 05/03/2016 1542   MCHC 32.4 05/03/2016 1542   RDW 12.3 05/03/2016 1542    Lithium Lvl  Date Value Ref Range Status  04/22/2016 <0.06 (L) 0.60 - 1.20 mmol/L Final     No results found for: PHENYTOIN, PHENOBARB, VALPROATE, CBMZ   .res Assessment: Plan:   Will continue current plan of care since target signs and symptoms are well controlled without any tolerability issues. Pt requests that Citalopram be changed to 40 mg 1/2 tab po qd since she prefers splitting tab. Pt to f/u in 3 months or sooner if clinically indicated.  Patient advised to contact office with any questions, adverse effects, or acute worsening in signs and symptoms.  Tabitha Bennett was seen today for follow-up.  Diagnoses and all orders for this visit:  Bipolar II disorder (HCC) -     citalopram (CELEXA) 40 MG tablet; TAKE 1/2 TO 1 TABLET BY MOUTH DAILY -     lamoTRIgine (LAMICTAL) 150 MG tablet; Take 1 tablet (150 mg total) by mouth daily. -     Lurasidone HCl (LATUDA) 60 MG TABS; Take 1/2-1 tab po qd with a meal. -      traZODone (DESYREL) 100 MG tablet; TAKE 1 AND 1/2 TO 2 TABLETS BY MOUTH AT BEDTIME  Anxiety disorder, unspecified type -     citalopram (CELEXA) 40 MG tablet; TAKE 1/2 TO 1 TABLET BY MOUTH DAILY -     ALPRAZolam (XANAX) 0.5 MG tablet; Take 1 tablet (0.5 mg total) by mouth 3 (three) times daily as needed.  ADHD, predominantly inattentive type -     lisdexamfetamine (VYVANSE) 60 MG capsule; Take 1 capsule (60 mg total) by mouth every morning. -     lisdexamfetamine (VYVANSE) 60 MG capsule; Take 1 capsule (60 mg total) by mouth every morning. -     lisdexamfetamine (VYVANSE) 60 MG capsule; Take 1 capsule (60 mg total) by mouth daily.     Please see After Visit Summary for patient specific instructions.  Future Appointments  Date Time Provider Department Center  12/29/2019 10:00 AM Corie Chiquito, PMHNP CP-CP None    No orders of the defined types were placed in this encounter.   -------------------------------

## 2019-09-28 NOTE — Progress Notes (Signed)
   09/28/19 1022  Facial and Oral Movements  Muscles of Facial Expression 0  Lips and Perioral Area 0  Jaw 0  Tongue 0  Extremity Movements  Upper (arms, wrists, hands, fingers) 0  Lower (legs, knees, ankles, toes) 0  Trunk Movements  Neck, shoulders, hips 0  Overall Severity  Severity of abnormal movements (highest score from questions above) 0  Incapacitation due to abnormal movements 0  Patient's awareness of abnormal movements (rate only patient's report) 0  Dental Status  Current problems with teeth and/or dentures? No  Does patient usually wear dentures? No  AIMS Total Score  AIMS Total Score 0

## 2019-11-24 ENCOUNTER — Ambulatory Visit (INDEPENDENT_AMBULATORY_CARE_PROVIDER_SITE_OTHER): Payer: Self-pay | Admitting: Plastic Surgery

## 2019-11-24 ENCOUNTER — Other Ambulatory Visit: Payer: Self-pay

## 2019-11-24 ENCOUNTER — Encounter: Payer: Self-pay | Admitting: Plastic Surgery

## 2019-11-24 VITALS — BP 122/82 | HR 72 | Temp 98.5°F

## 2019-11-24 DIAGNOSIS — Z719 Counseling, unspecified: Secondary | ICD-10-CM

## 2019-11-24 NOTE — Progress Notes (Signed)
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 I discussed with the patient this proposed procedure of filler injections, which is customized depending on the particular needs of the patient. It is performed on facial volume loss as a temporary correction. The alternatives were discussed with the patient. The risks were addressed including bleeding, scarring, infection, damage to deeper structures, asymmetry, and chronic pain, which may occur infrequently after a procedure. The individual's choice to undergo a surgical procedure is based on the comparison of risks to potential benefits. Other risks include unsatisfactory results, allergic reaction, which should go away with time, bruising and delayed healing. Fillers do not arrest the aging process or produce permanent tightening.  Operative intervention maybe necessary to maintain the results. The patient understands and wishes to proceed. An informed consent was signed and informational brochures given to her prior to the procedure.  Procedure: The area was prepped with chlorhexidine and dried with a clean gauze. Using a clean technique, a 30 gauge needle was then used to inject the filler into the nasal labial folds and perioral wrinkles. This was done with one syringe.  No complications were noted. Light pressure was held for 5 minutes. She was instructed explicitly in post-operative care.  Juvederm Vollure XC LOT: Wayland Denis EXP: 2021-03-06  Botulinum Toxin Procedure Note  Procedure: Cosmetic botulinum toxin   Pre-operative Diagnosis: Dynamic rhytides  Post-operative Diagnosis: Same  Complications:  None  Brief history: The patient desires botulinum  toxin injection of her forehead. I discussed with the patient this proposed procedure of botulinum toxin injections, which is customized depending on the particular needs of the patient. It is performed on facial rhytids as a temporary correction. The alternatives were discussed with the patient. The risks were addressed including bleeding, scarring, infection, damage to deeper structures, asymmetry, and chronic pain, which may occur infrequently after a procedure. The individual's choice to undergo a surgical procedure is based on the comparison of risks to potential benefits. Other risks include unsatisfactory results, brow ptosis, eyelid ptosis, allergic reaction, temporary paralysis, which should go away with time, bruising, blurring disturbances and delayed healing. Botulinum toxin injections do not arrest the aging process or produce permanent tightening of the eyelid.  Operative intervention maybe necessary to maintain the results of a blepharoplasty or botulinum toxin. The patient understands and wishes to proceed.  Procedure: The area was prepped with alcohol and dried with a clean gauze. Using a clean technique, the botulinum toxin was diluted with 1.25 cc of preservative-free normal saline which was slowly injected with an 18 gauge needle in a tuberculin syringes.  A 32 gauge needles were then used to inject the botulinum toxin. This mixture allow for an aliquot of 5 units per 0.1 cc in each injection site.    Subsequently the mixture was injected in the glabellar and forehead area with preservation of the temporal branch to the lateral eyebrow as well as into each lateral canthal area beginning from the lateral orbital rim medial to the zygomaticus major in 3 separate areas. A total of 30 Units of botulinum toxin was used. The forehead and glabellar area was injected with care to inject intramuscular only while holding pressure on the supratrochlear vessels in each area during each injection on  either  side of the medial corrugators. The injection proceeded vertically superiorly to the medial 2/3 of the frontalis muscle and superior 2/3 of the lateral frontalis, again with preservation of the frontal branch.  No complications were noted. Light pressure was held for 5 minutes. She was instructed explicitly in post-operative care.  Botox LOT:  [id number] C2 EXP:  12/23

## 2019-12-15 ENCOUNTER — Other Ambulatory Visit: Payer: Self-pay

## 2019-12-15 ENCOUNTER — Ambulatory Visit (INDEPENDENT_AMBULATORY_CARE_PROVIDER_SITE_OTHER): Payer: 59 | Admitting: Psychiatry

## 2019-12-15 ENCOUNTER — Encounter: Payer: Self-pay | Admitting: Psychiatry

## 2019-12-15 DIAGNOSIS — F419 Anxiety disorder, unspecified: Secondary | ICD-10-CM | POA: Diagnosis not present

## 2019-12-15 DIAGNOSIS — F900 Attention-deficit hyperactivity disorder, predominantly inattentive type: Secondary | ICD-10-CM | POA: Diagnosis not present

## 2019-12-15 DIAGNOSIS — F3181 Bipolar II disorder: Secondary | ICD-10-CM | POA: Diagnosis not present

## 2019-12-15 MED ORDER — LISDEXAMFETAMINE DIMESYLATE 60 MG PO CAPS: 60.0000 mg | ORAL_CAPSULE | ORAL | 0 refills | Status: DC

## 2019-12-15 MED ORDER — LATUDA 60 MG PO TABS: ORAL_TABLET | ORAL | 2 refills | Status: DC

## 2019-12-15 MED ORDER — TRAZODONE HCL 100 MG PO TABS: ORAL_TABLET | ORAL | 0 refills | Status: DC

## 2019-12-15 MED ORDER — LAMOTRIGINE 150 MG PO TABS: 150.0000 mg | ORAL_TABLET | Freq: Every day | ORAL | 0 refills | Status: DC

## 2019-12-15 MED ORDER — LISDEXAMFETAMINE DIMESYLATE 60 MG PO CAPS: 60.0000 mg | ORAL_CAPSULE | Freq: Every day | ORAL | 0 refills | Status: DC

## 2019-12-15 MED ORDER — ALPRAZOLAM 0.5 MG PO TABS: 0.5000 mg | ORAL_TABLET | Freq: Three times a day (TID) | ORAL | 2 refills | Status: DC | PRN

## 2019-12-15 NOTE — Progress Notes (Signed)
[facility] [place] [id number] 1966-03-08 54 y.o.  Subjective:   Patient ID:  Tabitha Bennett is a 54 y.o. (DOB 07/04/1965) female.  Chief Complaint:  Chief Complaint  Patient presents with  . Depression  . Follow-up    h/o anxiety, ADD    HPI Tabitha Bennett presents to the office today for follow-up of anxiety, mood disturbance, insomnia, and ADD. She reports, "I was fine until last week" and then started having increased depression. She reports chronic anxiety without any recent worsening. She reports that she took an extra 20 mg of Citalopram x 1. "I really don't want to change any of my medicines." Denies any worsening depression in the fall. She has been isolating some and more withdrawn. She reports that she is not able to communicate as well. She reports worsening concentration. Energy and motivation have been lower. Decreased interest in things and reports that she does not want to go out to dinner tomorrow to celebrate their anniversary. Denies impulsive or risky behavior. Denies SI.   Past medication trials: Celexa Lexapro Paxil Prozac Sertraline Effexor Trintellix Wellbutrin Lamictal Vraylar-weight gain Latuda Rexulti-tremor Adderall XR Vyvanse Xanax Trazodone Buspar  AIMS     Office Visit from 09/28/2019 in [facility] Office Visit from 07/06/2019 in [facility]  AIMS Total Score 0 0       Review of Systems:  Review of Systems  Respiratory:       Had one incident of shortness of breath.   Cardiovascular: Negative for palpitations.  Musculoskeletal: Negative for gait problem.  Neurological: Positive for tremors.       Notices some upper extremity tremor. Denies any worsening in tremor.   Psychiatric/Behavioral:       Please refer to HPI    Medications: I have reviewed the patient's current medications.  Current Outpatient Medications  Medication Sig Dispense Refill  . [START ON 01/12/2020]  ALPRAZolam (XANAX) 0.5 MG tablet Take 1 tablet (0.5 mg total) by mouth 3 (three) times daily as needed. 90 tablet 2  . Cholecalciferol (VITAMIN D3) 5000 units CAPS Take by mouth.    . citalopram (CELEXA) 40 MG tablet TAKE 1/2 TO 1 TABLET BY MOUTH DAILY 180 tablet 1  . estradiol (ESTRACE) 0.5 MG tablet Take 0.5 mg by mouth at bedtime.   2  . lamoTRIgine (LAMICTAL) 150 MG tablet Take 1 tablet (150 mg total) by mouth daily. 90 tablet 0  . [START ON 01/12/2020] lisdexamfetamine (VYVANSE) 60 MG capsule Take 1 capsule (60 mg total) by mouth every morning. 30 capsule 0  . [START ON 02/09/2020] lisdexamfetamine (VYVANSE) 60 MG capsule Take 1 capsule (60 mg total) by mouth every morning. 30 capsule 0  . [START ON 03/08/2020] lisdexamfetamine (VYVANSE) 60 MG capsule Take 1 capsule (60 mg total) by mouth daily. 30 capsule 0  . Lurasidone HCl (LATUDA) 60 MG TABS Take 1/2-1 tab po qd with a meal. 30 tablet 2  . Omega-3 Fatty Acids (FISH OIL) 1000 MG CAPS Take by mouth.    . progesterone (PROMETRIUM) 200 MG capsule Take 100 mg by mouth at bedtime.     . traZODone (DESYREL) 100 MG tablet TAKE 1 AND 1/2 TO 2 TABLETS BY MOUTH AT BEDTIME 180 tablet 0   No current facility-administered medications for this visit.    Medication Side Effects: None  Allergies: No Known Allergies  Past Medical History:  Diagnosis Date  . Bipolar 1 disorder (HCC)     Family History  Problem Relation  Age of Onset  . Breast cancer Neg Hx     Social History   Socioeconomic History  . Marital status: Married    Spouse name: Not on file  . Number of children: Not on file  . Years of education: Not on file  . Highest education level: Not on file  Occupational History  . Not on file  Tobacco Use  . Smoking status: Never Smoker  . Smokeless tobacco: Never Used  Substance and Sexual Activity  . Alcohol use: Yes    Alcohol/week: 1.0 standard drink    Types: 1 Glasses of wine per week    Comment: 1 glass of wine per week   . Drug use: No  . Sexual activity: Not on file  Other Topics Concern  . Not on file  Social History Narrative  . Not on file   Social Determinants of Health   Financial Resource Strain:   . Difficulty of Paying Living Expenses: Not on file  Food Insecurity:   . Worried About Programme researcher, broadcasting/film/video in the Last Year: Not on file  . Ran Out of Food in the Last Year: Not on file  Transportation Needs:   . Lack of Transportation (Medical): Not on file  . Lack of Transportation (Non-Medical): Not on file  Physical Activity:   . Days of Exercise per Week: Not on file  . Minutes of Exercise per Session: Not on file  Stress:   . Feeling of Stress : Not on file  Social Connections:   . Frequency of Communication with Friends and Family: Not on file  . Frequency of Social Gatherings with Friends and Family: Not on file  . Attends Religious Services: Not on file  . Active Member of Clubs or Organizations: Not on file  . Attends Banker Meetings: Not on file  . Marital Status: Not on file  Intimate Partner Violence:   . Fear of Current or Ex-Partner: Not on file  . Emotionally Abused: Not on file  . Physically Abused: Not on file  . Sexually Abused: Not on file    Past Medical History, Surgical history, Social history, and Family history were reviewed and updated as appropriate.   Please see review of systems for further details on the patient's review from today.   Objective:   Physical Exam:  BP 121/63   Pulse (!) 57   Physical Exam Constitutional:      General: She is not in acute distress. Musculoskeletal:        General: No deformity.  Neurological:     Mental Status: She is alert and oriented to person, place, and time.     Coordination: Coordination normal.  Psychiatric:        Attention and Perception: Attention and perception normal. She does not perceive auditory or visual hallucinations.        Mood and Affect: Mood is depressed. Mood is not anxious.  Affect is not labile, blunt, angry or inappropriate.        Speech: Speech normal.        Behavior: Behavior is withdrawn. Behavior is cooperative.        Thought Content: Thought content normal. Thought content is not paranoid or delusional. Thought content does not include homicidal or suicidal ideation. Thought content does not include homicidal or suicidal plan.        Cognition and Memory: Cognition and memory normal.        Judgment: Judgment normal.  Comments: Insight intact     Lab Review:     Component Value Date/Time   NA 138 05/03/2016 1542   K 3.8 05/03/2016 1542   CL 104 05/03/2016 1542   CO2 26 05/03/2016 1542   GLUCOSE 100 (H) 05/03/2016 1542   BUN 13 05/03/2016 1542   CREATININE 1.02 (H) 05/03/2016 1542   CALCIUM 9.2 05/03/2016 1542   PROT 6.0 11/25/2010 0942   ALBUMIN 3.0 (L) 11/25/2010 0942   AST 21 11/25/2010 0942   ALT 18 11/25/2010 0942   ALKPHOS 43 11/25/2010 0942   BILITOT 0.2 (L) 11/25/2010 0942   GFRNONAA >60 05/03/2016 1542   GFRAA >60 05/03/2016 1542       Component Value Date/Time   WBC 6.6 05/03/2016 1542   RBC 4.09 05/03/2016 1542   HGB 12.8 05/03/2016 1542   HCT 39.5 05/03/2016 1542   PLT 279 05/03/2016 1542   MCV 96.6 05/03/2016 1542   MCH 31.3 05/03/2016 1542   MCHC 32.4 05/03/2016 1542   RDW 12.3 05/03/2016 1542    Lithium Lvl  Date Value Ref Range Status  04/22/2016 <0.06 (L) 0.60 - 1.20 mmol/L Final     No results found for: PHENYTOIN, PHENOBARB, VALPROATE, CBMZ   .res Assessment: Plan:   Pt reports that she would like to continue current medications at this time since these medications have been most effective and well tolerated. Discussed considering increase in Latuda short-term if depression does not improve. Continue Latuda 30 mg at this time for mood s/s.  Continue Lamictal 150 mg po qd for mood s/s.  Continue Celexa 20 mg daily for mood and anxiety.  Continue Vyvanse 60 mg po qd for ADD. Continue Trazodone as  needed for insomnia.  Pt to follow-up in 3 months or sooner if clinically indicated.  Patient advised to contact office with any questions, adverse effects, or acute worsening in signs and symptoms.  Tabitha Bennett was seen today for depression and follow-up.  Diagnoses and all orders for this visit:  ADHD, predominantly inattentive type -     Discontinue: lisdexamfetamine (VYVANSE) 60 MG capsule; Take 1 capsule (60 mg total) by mouth every morning. -     lisdexamfetamine (VYVANSE) 60 MG capsule; Take 1 capsule (60 mg total) by mouth every morning. -     lisdexamfetamine (VYVANSE) 60 MG capsule; Take 1 capsule (60 mg total) by mouth every morning. -     lisdexamfetamine (VYVANSE) 60 MG capsule; Take 1 capsule (60 mg total) by mouth daily.  Anxiety disorder, unspecified type -     ALPRAZolam (XANAX) 0.5 MG tablet; Take 1 tablet (0.5 mg total) by mouth 3 (three) times daily as needed.  Bipolar II disorder (HCC) -     lamoTRIgine (LAMICTAL) 150 MG tablet; Take 1 tablet (150 mg total) by mouth daily. -     Lurasidone HCl (LATUDA) 60 MG TABS; Take 1/2-1 tab po qd with a meal. -     traZODone (DESYREL) 100 MG tablet; TAKE 1 AND 1/2 TO 2 TABLETS BY MOUTH AT BEDTIME     Please see After Visit Summary for patient specific instructions.  Future Appointments  Date Time Provider Department Bennett  12/25/2019 12:15 PM Dillingham, Alena Bills, DO PSS-PSS None  03/30/2020  9:30 AM Corie Chiquito, PMHNP CP-CP None    No orders of the defined types were placed in this encounter.   -------------------------------

## 2019-12-25 ENCOUNTER — Other Ambulatory Visit: Payer: 59 | Admitting: Plastic Surgery

## 2019-12-29 ENCOUNTER — Ambulatory Visit: Payer: 59 | Admitting: Psychiatry

## 2020-01-14 ENCOUNTER — Ambulatory Visit (INDEPENDENT_AMBULATORY_CARE_PROVIDER_SITE_OTHER): Payer: 59 | Admitting: Psychiatry

## 2020-01-14 ENCOUNTER — Encounter: Payer: Self-pay | Admitting: Psychiatry

## 2020-01-14 ENCOUNTER — Other Ambulatory Visit: Payer: Self-pay

## 2020-01-14 DIAGNOSIS — F3181 Bipolar II disorder: Secondary | ICD-10-CM

## 2020-01-14 DIAGNOSIS — F419 Anxiety disorder, unspecified: Secondary | ICD-10-CM

## 2020-01-14 DIAGNOSIS — F900 Attention-deficit hyperactivity disorder, predominantly inattentive type: Secondary | ICD-10-CM

## 2020-01-14 MED ORDER — LAMOTRIGINE 150 MG PO TABS: 150.0000 mg | ORAL_TABLET | Freq: Every day | ORAL | 0 refills | Status: DC

## 2020-01-14 MED ORDER — CITALOPRAM HYDROBROMIDE 20 MG PO TABS: 30.0000 mg | ORAL_TABLET | Freq: Every day | ORAL | 0 refills | Status: DC

## 2020-01-14 NOTE — Progress Notes (Signed)
[facility] [id number] 05-20-1965 54 y.o.  Subjective:   Patient ID:  Tabitha Bennett Weigel is a 54 y.o. (DOB 1965/08/16) female.  Chief Complaint:  Chief Complaint  Patient presents with  . Depression  . Anxiety    HPI [facility] presents to the office today for follow-up of anxiety, mood disturbance, insomnia, and ADD. She reports that depression has been worse for the last 1.5 weeks. She reports that anxiety has been increased. Denies panic attacks. She reports that her concentration has been significantly impaired and recently noticed that she could not recall how to take a picture on her phone. She reports that her energy and motivation have been very low and she is having difficulty completing her usual responsibilities. She reports persistent depressed mood. Has had some irritability. She reports that her sleep has been adequate and about 8 hours a night. Appetite has been poor and then tends to binge at night. Denies impulsive or risky behavior. Denies SI.   She reports that she increased Latuda to 40 mg po qd a few days after last visit. She reports that increased dose helped with negative and frightening thoughts.   Has been caring for pre-school aged grandchild who has been staying overnight with them.   Taking Xanax twice daily and is drinking wine at night.  Has reached out and scheduled apt with Biblical counselor on January 5th.   Past medication trials: Celexa Lexapro Paxil Prozac Sertraline Effexor Trintellix Wellbutrin Lamictal Vraylar-weight gain Latuda- Reports side effects (akathisia, agitation) with 60 mg.  Rexulti-tremor Adderall XR Vyvanse Xanax Trazodone Buspar   AIMS     Office Visit from 01/14/2020 in [facility] Office Visit from 09/28/2019 in [facility] Office Visit from 07/06/2019 in [facility]  AIMS Total Score 0 0 0       Review of Systems:  Review of  Systems  Constitutional: Positive for appetite change.  Gastrointestinal:       Reports intermittent sharp pain beneath her sternum.   Musculoskeletal: Negative for gait problem.  Neurological: Negative for tremors.  Psychiatric/Behavioral:       Please refer to HPI    Medications: I have reviewed the patient's current medications.  Current Outpatient Medications  Medication Sig Dispense Refill  . ALPRAZolam (XANAX) 0.5 MG tablet Take 1 tablet (0.5 mg total) by mouth 3 (three) times daily as needed. 90 tablet 2  . Cholecalciferol (VITAMIN D3) 5000 units CAPS Take by mouth.    . citalopram (CELEXA) 20 MG tablet Take 1.5 tablets (30 mg total) by mouth daily. 135 tablet 0  . estradiol (ESTRACE) 0.5 MG tablet Take 0.5 mg by mouth at bedtime.   2  . lamoTRIgine (LAMICTAL) 150 MG tablet Take 1 tablet (150 mg total) by mouth daily. 90 tablet 0  . lisdexamfetamine (VYVANSE) 60 MG capsule Take 1 capsule (60 mg total) by mouth every morning. 30 capsule 0  . [START ON 02/09/2020] lisdexamfetamine (VYVANSE) 60 MG capsule Take 1 capsule (60 mg total) by mouth every morning. 30 capsule 0  . [START ON 03/08/2020] lisdexamfetamine (VYVANSE) 60 MG capsule Take 1 capsule (60 mg total) by mouth daily. 30 capsule 0  . Lurasidone HCl (LATUDA) 60 MG TABS Take 1/2-1 tab po qd with a meal. (Patient taking differently: 40 mg. Take 1/2-1 tab po qd with a meal.) 30 tablet 2  . Omega-3 Fatty Acids (FISH OIL) 1000 MG CAPS Take by mouth.    . progesterone (PROMETRIUM) 200 MG  capsule Take 100 mg by mouth at bedtime.     . traZODone (DESYREL) 100 MG tablet TAKE 1 AND 1/2 TO 2 TABLETS BY MOUTH AT BEDTIME 180 tablet 0   No current facility-administered medications for this visit.    Medication Side Effects: None  Allergies: No Known Allergies  Past Medical History:  Diagnosis Date  . Bipolar 1 disorder (HCC)     Family History  Problem Relation Age of Onset  . Breast cancer Neg Hx     Social History    Socioeconomic History  . Marital status: Married    Spouse name: Not on file  . Number of children: Not on file  . Years of education: Not on file  . Highest education level: Not on file  Occupational History  . Not on file  Tobacco Use  . Smoking status: Never Smoker  . Smokeless tobacco: Never Used  Substance and Sexual Activity  . Alcohol use: Yes    Alcohol/week: 1.0 standard drink    Types: 1 Glasses of wine per week    Comment: 1 glass of wine per week  . Drug use: No  . Sexual activity: Not on file  Other Topics Concern  . Not on file  Social History Narrative  . Not on file   Social Determinants of Health   Financial Resource Strain:   . Difficulty of Paying Living Expenses: Not on file  Food Insecurity:   . Worried About Programme researcher, broadcasting/film/video in the Last Year: Not on file  . Ran Out of Food in the Last Year: Not on file  Transportation Needs:   . Lack of Transportation (Medical): Not on file  . Lack of Transportation (Non-Medical): Not on file  Physical Activity:   . Days of Exercise per Week: Not on file  . Minutes of Exercise per Session: Not on file  Stress:   . Feeling of Stress : Not on file  Social Connections:   . Frequency of Communication with Friends and Family: Not on file  . Frequency of Social Gatherings with Friends and Family: Not on file  . Attends Religious Services: Not on file  . Active Member of Clubs or Organizations: Not on file  . Attends Banker Meetings: Not on file  . Marital Status: Not on file  Intimate Partner Violence:   . Fear of Current or Ex-Partner: Not on file  . Emotionally Abused: Not on file  . Physically Abused: Not on file  . Sexually Abused: Not on file    Past Medical History, Surgical history, Social history, and Family history were reviewed and updated as appropriate.   Please see review of systems for further details on the patient's review from today.   Objective:   Physical Exam:  There  were no vitals taken for this visit.  Physical Exam Constitutional:      General: She is not in acute distress. Musculoskeletal:        General: No deformity.  Neurological:     Mental Status: She is alert and oriented to person, place, and time.     Coordination: Coordination normal.  Psychiatric:        Attention and Perception: Attention and perception normal. She does not perceive auditory or visual hallucinations.        Mood and Affect: Mood is anxious and depressed. Affect is not labile, blunt, angry or inappropriate.        Speech: Speech normal.  Behavior: Behavior normal.        Thought Content: Thought content normal. Thought content is not paranoid or delusional. Thought content does not include homicidal or suicidal ideation. Thought content does not include homicidal or suicidal plan.        Cognition and Memory: Cognition and memory normal.        Judgment: Judgment normal.     Comments: Insight intact     Lab Review:     Component Value Date/Time   NA 138 05/03/2016 1542   K 3.8 05/03/2016 1542   CL 104 05/03/2016 1542   CO2 26 05/03/2016 1542   GLUCOSE 100 (H) 05/03/2016 1542   BUN 13 05/03/2016 1542   CREATININE 1.02 (H) 05/03/2016 1542   CALCIUM 9.2 05/03/2016 1542   PROT 6.0 11/25/2010 0942   ALBUMIN 3.0 (L) 11/25/2010 0942   AST 21 11/25/2010 0942   ALT 18 11/25/2010 0942   ALKPHOS 43 11/25/2010 0942   BILITOT 0.2 (L) 11/25/2010 0942   GFRNONAA >60 05/03/2016 1542   GFRAA >60 05/03/2016 1542       Component Value Date/Time   WBC 6.6 05/03/2016 1542   RBC 4.09 05/03/2016 1542   HGB 12.8 05/03/2016 1542   HCT 39.5 05/03/2016 1542   PLT 279 05/03/2016 1542   MCV 96.6 05/03/2016 1542   MCH 31.3 05/03/2016 1542   MCHC 32.4 05/03/2016 1542   RDW 12.3 05/03/2016 1542    Lithium Lvl  Date Value Ref Range Status  04/22/2016 <0.06 (L) 0.60 - 1.20 mmol/L Final     No results found for: PHENYTOIN, PHENOBARB, VALPROATE, CBMZ    .res Assessment: Plan:   Discussed potential benefits, risks, and side effects of increasing Celexa to 30 mg po qd for mood and anxiety.Pt agrees to increase in Citalopram. Will continue all other medications as prescribed.  Offered earlier follow-up, however pt prefers to follow-up on 03/30/20 as previously scheduled. Continue Lamotrigine 150 mg po qd for mood stabilization.  Continue Latuda 40 mg po qd for mood s/s. Continue Trazodone 100 mg po QHS for insomnia. Continue Vyvanse 60 mg po qd for ADHD.  Continue Alprazolam 0.5 mg po TID prn anxiety.  Patient advised to contact office with any questions, adverse effects, or acute worsening in signs and symptoms.  Kavina was seen today for depression and anxiety.  Diagnoses and all orders for this visit:  Bipolar II disorder (HCC) -     citalopram (CELEXA) 20 MG tablet; Take 1.5 tablets (30 mg total) by mouth daily. -     lamoTRIgine (LAMICTAL) 150 MG tablet; Take 1 tablet (150 mg total) by mouth daily.  Anxiety disorder, unspecified type -     citalopram (CELEXA) 20 MG tablet; Take 1.5 tablets (30 mg total) by mouth daily.  ADHD, predominantly inattentive type     Please see After Visit Summary for patient specific instructions.  Future Appointments  Date Time Provider Department Center  03/30/2020  9:30 AM Corie Chiquito, PMHNP CP-CP None    No orders of the defined types were placed in this encounter.   -------------------------------

## 2020-01-14 NOTE — Progress Notes (Signed)
   01/14/20 1014  Facial and Oral Movements  Muscles of Facial Expression 0  Lips and Perioral Area 0  Jaw 0  Tongue 0  Extremity Movements  Upper (arms, wrists, hands, fingers) 0  Lower (legs, knees, ankles, toes) 0  Trunk Movements  Neck, shoulders, hips 0  Overall Severity  Severity of abnormal movements (highest score from questions above) 0  Incapacitation due to abnormal movements 0  Patient's awareness of abnormal movements (rate only patient's report) 0  AIMS Total Score  AIMS Total Score 0

## 2020-02-01 ENCOUNTER — Telehealth: Payer: Self-pay | Admitting: Psychiatry
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
## 2020-02-01 NOTE — Telephone Encounter (Signed)
Please review

## 2020-02-01 NOTE — Telephone Encounter (Signed)
Fill date 01/12/20.

## 2020-02-02 ENCOUNTER — Telehealth: Payer: Self-pay | Admitting: Psychiatry

## 2020-02-02 NOTE — Telephone Encounter (Signed)
I would usually her own judgment and knowing the patient.  For the patient has been reliably taking Vyvanse and not requesting early refills for a period of time then I would okay refill early or give her the extra 8 pills.  If there is a pattern of seeking early refills or losing prescriptions etc. then I would not okay the refill.

## 2020-02-02 NOTE — Telephone Encounter (Signed)
Pt called to check status on med she called about 02/01/20. Has Rx fill date 11/30. Pt asking for early fill date for 11/30 Rx. Leaving 11/26 for [place] and will not be her 11/30. Contact Pt @ [phone]

## 2020-02-02 NOTE — Telephone Encounter (Signed)
Reviewed thank you 

## 2020-02-02 NOTE — Telephone Encounter (Signed)
I believe I sent a message yesterday on her as well.

## 2020-02-03 ENCOUNTER — Telehealth: Payer: Self-pay | Admitting: Psychiatry

## 2020-02-03 NOTE — Telephone Encounter (Signed)
Thanks so much Vernona Rieger

## 2020-02-03 NOTE — Telephone Encounter (Signed)
Please contact patient and have her bring in paper Rx to [facility], I did okay refill for today 11/24 but they do not have the Rx from her yet.

## 2020-02-03 NOTE — Telephone Encounter (Signed)
I've left her a message to walk in the [facility] [address]

## 2020-02-03 NOTE — Telephone Encounter (Signed)
Clarification on previous message. 11/25 is Thanksgiving so will contact pharmacy to fill today 11/24 instead

## 2020-02-03 NOTE — Telephone Encounter (Signed)
I've tried reaching [facility] numerous times and it keeps disconnecting me.

## 2020-02-03 NOTE — Telephone Encounter (Signed)
Addendum to previous message...Marland KitchenMarland KitchenPatient walked into the office asking why her medication for Vyvanse 60 mg wasn't called in to [facility] [address]. She is going out of town and needs her medication sent in earlier. Told her I'd send another message. She asked for someone to call her and let her know when it was sent in. [phone].

## 2020-02-16 ENCOUNTER — Other Ambulatory Visit: Payer: Self-pay | Admitting: Family Medicine

## 2020-02-16 DIAGNOSIS — Z1231 Encounter for screening mammogram for malignant neoplasm of breast: Secondary | ICD-10-CM

## 2020-02-19 ENCOUNTER — Other Ambulatory Visit: Payer: Self-pay

## 2020-02-19 ENCOUNTER — Ambulatory Visit
Admission: RE | Admit: 2020-02-19 | Discharge: 2020-02-19 | Disposition: A | Discharge status: Home / Self Care | Payer: 59 | Source: Ambulatory Visit | Attending: Family Medicine | Admitting: Family Medicine

## 2020-02-19 DIAGNOSIS — Z1231 Encounter for screening mammogram for malignant neoplasm of breast: Secondary | ICD-10-CM

## 2020-02-24 ENCOUNTER — Other Ambulatory Visit: Payer: Self-pay | Admitting: Family Medicine

## 2020-02-24 ENCOUNTER — Telehealth: Payer: Self-pay | Admitting: Psychiatry

## 2020-02-24 DIAGNOSIS — R928 Other abnormal and inconclusive findings on diagnostic imaging of breast: Secondary | ICD-10-CM

## 2020-02-24 DIAGNOSIS — F900 Attention-deficit hyperactivity disorder, predominantly inattentive type: Secondary | ICD-10-CM
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
## 2020-02-26 MED ORDER — LISDEXAMFETAMINE DIMESYLATE 60 MG PO CAPS: 60.0000 mg | ORAL_CAPSULE | Freq: Every day | ORAL | 0 refills | Status: DC

## 2020-02-26 MED ORDER — LISDEXAMFETAMINE DIMESYLATE 60 MG PO CAPS: 60.0000 mg | ORAL_CAPSULE | ORAL | 0 refills | Status: DC

## 2020-02-26 NOTE — Telephone Encounter (Signed)
Left message for pt to come pick up scripts and bring the old ones in

## 2020-03-18 ENCOUNTER — Ambulatory Visit
Admission: RE | Admit: 2020-03-18 | Discharge: 2020-03-18 | Disposition: A | Discharge status: Home / Self Care | Payer: 59 | Source: Ambulatory Visit | Attending: Family Medicine | Admitting: Family Medicine

## 2020-03-18 ENCOUNTER — Other Ambulatory Visit: Payer: Self-pay

## 2020-03-18 ENCOUNTER — Ambulatory Visit: Payer: 59

## 2020-03-18 DIAGNOSIS — R928 Other abnormal and inconclusive findings on diagnostic imaging of breast: Secondary | ICD-10-CM

## 2020-03-22 ENCOUNTER — Other Ambulatory Visit: Payer: Self-pay | Admitting: Orthopaedic Surgery

## 2020-03-22 DIAGNOSIS — R519 Headache, unspecified: Secondary | ICD-10-CM

## 2020-03-27 ENCOUNTER — Other Ambulatory Visit: Payer: 59

## 2020-03-27 ENCOUNTER — Inpatient Hospital Stay: Admission: RE | Admit: 2020-03-27 | Payer: 59 | Source: Ambulatory Visit

## 2020-03-28 ENCOUNTER — Other Ambulatory Visit: Payer: Self-pay

## 2020-03-28 ENCOUNTER — Other Ambulatory Visit: Payer: Self-pay | Admitting: Orthopaedic Surgery

## 2020-03-28 ENCOUNTER — Ambulatory Visit (HOSPITAL_COMMUNITY)
Admission: RE | Admit: 2020-03-28 | Discharge: 2020-03-28 | Disposition: A | Discharge status: Home / Self Care | Payer: 59 | Source: Ambulatory Visit | Attending: Orthopaedic Surgery | Admitting: Orthopaedic Surgery

## 2020-03-28 ENCOUNTER — Other Ambulatory Visit (HOSPITAL_COMMUNITY): Payer: Self-pay | Admitting: Orthopaedic Surgery

## 2020-03-28 DIAGNOSIS — R519 Headache, unspecified: Secondary | ICD-10-CM | POA: Diagnosis not present

## 2020-03-28 MED ORDER — GADOBUTROL 1 MMOL/ML IV SOLN
6.0000 mL | Freq: Once | INTRAVENOUS | Status: AC | PRN
  Administered 2020-03-28: 6 mL via INTRAVENOUS

## 2020-03-30 ENCOUNTER — Telehealth (INDEPENDENT_AMBULATORY_CARE_PROVIDER_SITE_OTHER): Payer: 59 | Admitting: Psychiatry

## 2020-03-30 ENCOUNTER — Encounter: Payer: Self-pay | Admitting: Psychiatry

## 2020-03-30 ENCOUNTER — Telehealth: Payer: Self-pay | Admitting: Psychiatry

## 2020-03-30 DIAGNOSIS — F900 Attention-deficit hyperactivity disorder, predominantly inattentive type: Secondary | ICD-10-CM | POA: Diagnosis not present

## 2020-03-30 DIAGNOSIS — F419 Anxiety disorder, unspecified: Secondary | ICD-10-CM | POA: Diagnosis not present

## 2020-03-30 DIAGNOSIS — F3181 Bipolar II disorder: Secondary | ICD-10-CM

## 2020-03-30 MED ORDER — CITALOPRAM HYDROBROMIDE 40 MG PO TABS
ORAL_TABLET | ORAL | 0 refills | Status: DC
Start: 2020-03-30 — End: 2020-07-11

## 2020-03-30 MED ORDER — LISDEXAMFETAMINE DIMESYLATE 60 MG PO CAPS: 60.0000 mg | ORAL_CAPSULE | Freq: Every day | ORAL | 0 refills | Status: DC

## 2020-03-30 MED ORDER — LAMOTRIGINE 150 MG PO TABS: 150.0000 mg | ORAL_TABLET | Freq: Every day | ORAL | 0 refills | Status: DC

## 2020-03-30 MED ORDER — LURASIDONE HCL 80 MG PO TABS: ORAL_TABLET | ORAL | 2 refills | Status: DC

## 2020-03-30 MED ORDER — ALPRAZOLAM 0.5 MG PO TABS: 0.5000 mg | ORAL_TABLET | Freq: Three times a day (TID) | ORAL | 2 refills | Status: DC | PRN

## 2020-03-30 MED ORDER — TRAZODONE HCL 50 MG PO TABS
ORAL_TABLET | ORAL | 0 refills | Status: DC
Start: 2020-03-30 — End: 2020-07-11
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
## 2020-03-30 NOTE — Telephone Encounter (Signed)
Ms. jadis, pitter are scheduled for a virtual visit with your provider today.    Just as we do with appointments in the office, we must obtain your consent to participate.  Your consent will be active for this visit and any virtual visit you may have with one of our providers in the next 365 days.    If you have a MyChart account, I can also send a copy of this consent to you electronically.  All virtual visits are billed to your insurance company just like a traditional visit in the office.  As this is a virtual visit, video technology does not allow for your provider to perform a traditional examination.  This may limit your provider's ability to fully assess your condition.  If your provider identifies any concerns that need to be evaluated in person or the need to arrange testing such as labs, EKG, etc, we will make arrangements to do so.    Although advances in technology are sophisticated, we cannot ensure that it will always work on either your end or our end.  If the connection with a video visit is poor, we may have to switch to a telephone visit.  With either a video or telephone visit, we are not always able to ensure that we have a secure connection.   I need to obtain your verbal consent now.   Are you willing to proceed with your visit today?   [facility] Conkel has provided verbal consent on 03/30/2020 for a virtual visit (video or telephone).   Corie Chiquito, PMHNP 03/30/2020  10:17 AM

## 2020-04-04 ENCOUNTER — Telehealth: Payer: Self-pay

## 2020-04-04 NOTE — Telephone Encounter (Signed)
Prior Authorization approved for VYVANSE 60 MG effective 04/04/2020-04/04/2023, [employer] ID# [id number]

## 2020-04-05 ENCOUNTER — Ambulatory Visit (INDEPENDENT_AMBULATORY_CARE_PROVIDER_SITE_OTHER): Payer: Self-pay | Admitting: Plastic Surgery

## 2020-04-05 ENCOUNTER — Other Ambulatory Visit: Payer: Self-pay

## 2020-04-05 ENCOUNTER — Encounter: Payer: Self-pay | Admitting: Plastic Surgery

## 2020-04-05 VITALS — BP 130/83

## 2020-04-05 DIAGNOSIS — Z719 Counseling, unspecified: Secondary | ICD-10-CM

## 2020-04-05 NOTE — Progress Notes (Signed)

## 2020-04-13 ENCOUNTER — Telehealth: Payer: Self-pay | Admitting: Psychiatry

## 2020-04-13 DIAGNOSIS — F900 Attention-deficit hyperactivity disorder, predominantly inattentive type: Secondary | ICD-10-CM

## 2020-04-13 MED ORDER — LISDEXAMFETAMINE DIMESYLATE 60 MG PO CAPS: 60.0000 mg | ORAL_CAPSULE | ORAL | 0 refills | Status: DC

## 2020-04-13 NOTE — Telephone Encounter (Signed)
Scripts printed for Vyvanse for February and March per pt's request

## 2020-04-14 ENCOUNTER — Other Ambulatory Visit: Payer: Self-pay | Admitting: Psychiatry

## 2020-04-14 DIAGNOSIS — F3181 Bipolar II disorder: Secondary | ICD-10-CM

## 2020-04-14 DIAGNOSIS — F419 Anxiety disorder, unspecified: Secondary | ICD-10-CM

## 2020-06-02 ENCOUNTER — Other Ambulatory Visit: Payer: Self-pay | Admitting: Psychiatry

## 2020-06-02 DIAGNOSIS — F3181 Bipolar II disorder: Secondary | ICD-10-CM

## 2020-06-02 DIAGNOSIS — F419 Anxiety disorder, unspecified: Secondary | ICD-10-CM

## 2020-06-06 ENCOUNTER — Telehealth: Payer: Self-pay | Admitting: Psychiatry

## 2020-06-06 NOTE — Telephone Encounter (Signed)
ERROR

## 2020-06-20 ENCOUNTER — Other Ambulatory Visit: Payer: Self-pay | Admitting: Psychiatry

## 2020-06-20 ENCOUNTER — Telehealth: Payer: Self-pay | Admitting: Psychiatry

## 2020-06-20 DIAGNOSIS — F419 Anxiety disorder, unspecified: Secondary | ICD-10-CM

## 2020-06-20 DIAGNOSIS — F3181 Bipolar II disorder: Secondary | ICD-10-CM

## 2020-06-20 DIAGNOSIS — F900 Attention-deficit hyperactivity disorder, predominantly inattentive type: Secondary | ICD-10-CM

## 2020-06-20 MED ORDER — LISDEXAMFETAMINE DIMESYLATE 60 MG PO CAPS: 60.0000 mg | ORAL_CAPSULE | ORAL | 0 refills | Status: DC

## 2020-06-20 NOTE — Telephone Encounter (Signed)
LVM and let her know to call back if she has questions

## 2020-06-20 NOTE — Telephone Encounter (Signed)
Script printed with fill date of 4/21 since it was last filled 06/02/20. Please let her know it is ready for pick up.

## 2020-06-20 NOTE — Telephone Encounter (Signed)
Tabitha Bennett called requesting a written script of vyvanse 60 mg. She said this was the second time she has called. Please call her and let her know when the script is ready for pick up at [phone]

## 2020-07-01 ENCOUNTER — Other Ambulatory Visit: Payer: 59 | Admitting: Plastic Surgery

## 2020-07-08 ENCOUNTER — Ambulatory Visit: Payer: 59 | Admitting: Psychiatry

## 2020-07-09 ENCOUNTER — Other Ambulatory Visit: Payer: Self-pay | Admitting: Psychiatry

## 2020-07-09 DIAGNOSIS — F3181 Bipolar II disorder: Secondary | ICD-10-CM

## 2020-07-09 DIAGNOSIS — F419 Anxiety disorder, unspecified: Secondary | ICD-10-CM

## 2020-07-14 ENCOUNTER — Other Ambulatory Visit: Payer: Self-pay | Admitting: Psychiatry

## 2020-07-14 DIAGNOSIS — F3181 Bipolar II disorder: Secondary | ICD-10-CM

## 2020-07-14 DIAGNOSIS — F419 Anxiety disorder, unspecified: Secondary | ICD-10-CM

## 2020-07-15 ENCOUNTER — Other Ambulatory Visit: Payer: Self-pay | Admitting: Psychiatry

## 2020-07-15 DIAGNOSIS — F3181 Bipolar II disorder: Secondary | ICD-10-CM

## 2020-07-20 ENCOUNTER — Ambulatory Visit (INDEPENDENT_AMBULATORY_CARE_PROVIDER_SITE_OTHER): Payer: 59 | Admitting: Psychiatry

## 2020-07-20 ENCOUNTER — Encounter: Payer: Self-pay | Admitting: Psychiatry

## 2020-07-20 ENCOUNTER — Other Ambulatory Visit: Payer: Self-pay

## 2020-07-20 DIAGNOSIS — F900 Attention-deficit hyperactivity disorder, predominantly inattentive type: Secondary | ICD-10-CM | POA: Diagnosis not present

## 2020-07-20 DIAGNOSIS — F419 Anxiety disorder, unspecified: Secondary | ICD-10-CM

## 2020-07-20 DIAGNOSIS — F3181 Bipolar II disorder: Secondary | ICD-10-CM

## 2020-07-20 MED ORDER — LAMOTRIGINE 150 MG PO TABS: 150.0000 mg | ORAL_TABLET | Freq: Every day | ORAL | 1 refills | Status: DC

## 2020-07-20 MED ORDER — LISDEXAMFETAMINE DIMESYLATE 60 MG PO CAPS: 60.0000 mg | ORAL_CAPSULE | ORAL | 0 refills | Status: DC

## 2020-07-20 MED ORDER — LURASIDONE HCL 80 MG PO TABS: ORAL_TABLET | ORAL | 5 refills | Status: DC

## 2020-07-20 MED ORDER — LISDEXAMFETAMINE DIMESYLATE 60 MG PO CAPS: 60.0000 mg | ORAL_CAPSULE | Freq: Every day | ORAL | 0 refills | Status: DC

## 2020-07-20 MED ORDER — AMPHETAMINE-DEXTROAMPHETAMINE 10 MG PO TABS: ORAL_TABLET | ORAL | 0 refills | Status: DC

## 2020-07-20 MED ORDER — ALPRAZOLAM 0.5 MG PO TABS: 0.5000 mg | ORAL_TABLET | Freq: Three times a day (TID) | ORAL | 2 refills | Status: DC | PRN

## 2020-07-20 MED ORDER — CITALOPRAM HYDROBROMIDE 20 MG PO TABS: ORAL_TABLET | ORAL | 2 refills | Status: DC

## 2020-07-20 MED ORDER — TRAZODONE HCL 50 MG PO TABS: ORAL_TABLET | ORAL | 0 refills | Status: DC

## 2020-07-20 NOTE — Progress Notes (Signed)
   07/20/20 0954  Facial and Oral Movements  Muscles of Facial Expression 0  Lips and Perioral Area 0  Jaw 0  Tongue 0  Extremity Movements  Upper (arms, wrists, hands, fingers) 0  Lower (legs, knees, ankles, toes) 0  Trunk Movements  Neck, shoulders, hips 0  Overall Severity  Severity of abnormal movements (highest score from questions above) 0  Incapacitation due to abnormal movements 0  Patient's awareness of abnormal movements (rate only patient's report) 0  AIMS Total Score  AIMS Total Score 0

## 2020-07-20 NOTE — Progress Notes (Signed)
[facility] Oneil [id number] 1965-05-23 55 y.o.  Subjective:   Patient ID:  Tabitha Bennett is a 55 y.o. (DOB May 05, 1965) female.  Chief Complaint:  Chief Complaint  Patient presents with  . Follow-up    Anxiety,depression, and ADHD    HPI Tabitha Bennett presents to the office today for follow-up of mood disturbance, anxiety, and ADHD. Tabitha Bennett reports that Tabitha Bennett occasionally takes Citalopram 30 mg to improve energy. Recently took 30 mg for 5 days, stopped, and then resumed it. Tabitha Bennett reports that Latuda 40 mg seems to be most effective for her. Tabitha Bennett reports that her mood has been "good." Tabitha Bennett reports that Tabitha Bennett has times when mood is slightly low and energy and motivation are lower. Tabitha Bennett reports that these s/s improve immediately after increase in Celexa. Anxiety has been increased. Denies panic. Tabitha Bennett reports some feelings of nervousness. Tabitha Bennett reports feeling tired suddenly around 12 noon and then takes Xanax and feels better. Tabitha Bennett reports that her concentration is good in the mornings with Vyvanse and has more difficulty with concentration in the afternoon. Tabitha Bennett reports that cognitive deficits are getting worse. Tabitha Bennett reports occasional word finding difficulties. Tabitha Bennett tries to keep her brain active by playing new songs on the piano. Sleeping well. Appetite has been ok. Denies SI.   Tabitha Bennett reports that they continue to have their grandson frequently. Tabitha Bennett may have him 5 nights a week during the school year.   Past medication trials: Celexa Lexapro Paxil Prozac Sertraline Effexor Trintellix Wellbutrin Lamictal Vraylar-weight gain Latuda- Reports side effects (akathisia, agitation) with 60 mg. Rexulti-tremor Adderall XR Vyvanse Xanax Trazodone Buspar   AIMS   Flowsheet Row Office Visit from 07/20/2020 in [facility] Office Visit from 01/14/2020 in [facility] Office Visit from 09/28/2019 in [facility] Office Visit from 07/06/2019 in  [facility]  AIMS Total Score 0 0 0 0       Review of Systems:  Review of Systems  Cardiovascular: Negative for palpitations.  Musculoskeletal: Negative for gait problem.  Neurological:       Slight tremor on occasion when lying down to go to bed  Psychiatric/Behavioral:       Please refer to HPI  Tabitha Bennett reports that Tabitha Bennett has been referred to a nephrologist for impaired renal function.  Medications: I have reviewed the patient's current medications.  Current Outpatient Medications  Medication Sig Dispense Refill  . amphetamine-dextroamphetamine (ADDERALL) 10 MG tablet Take 1/2-1 tab po qd 30 tablet 0  . Cholecalciferol (VITAMIN D3) 5000 units CAPS Take by mouth.    . estradiol (ESTRACE) 0.5 MG tablet Take 0.5 mg by mouth at bedtime.   2  . progesterone (PROMETRIUM) 200 MG capsule Take 100 mg by mouth at bedtime.     . ALPRAZolam (XANAX) 0.5 MG tablet Take 1 tablet (0.5 mg total) by mouth 3 (three) times daily as needed. 90 tablet 2  . citalopram (CELEXA) 20 MG tablet TAKE 1 AND 1/2 TABLETS DAILY BY MOUTH 45 tablet 2  . lamoTRIgine (LAMICTAL) 150 MG tablet Take 1 tablet (150 mg total) by mouth daily. 90 tablet 1  . [START ON 07/28/2020] lisdexamfetamine (VYVANSE) 60 MG capsule Take 1 capsule (60 mg total) by mouth daily. 30 capsule 0  . [START ON 08/25/2020] lisdexamfetamine (VYVANSE) 60 MG capsule Take 1 capsule (60 mg total) by mouth every morning. 30 capsule 0  . [START ON 09/22/2020] lisdexamfetamine (VYVANSE) 60 MG capsule Take 1 capsule (60 mg total) by mouth every morning.  30 capsule 0  . lurasidone (LATUDA) 80 MG TABS tablet TAKE 1/2-1 TABLET DAILY BY MOUTH WITH SUPPER 30 tablet 5  . traZODone (DESYREL) 50 MG tablet TAKE 1 TO 2 TABLETS BY MOUTH AT BEDTIME 180 tablet 0   No current facility-administered medications for this visit.    Medication Side Effects: None  Allergies: No Known Allergies  Past Medical History:  Diagnosis Date  . Bipolar 1 disorder [facility])      Past Medical History, Surgical history, Social history, and Family history were reviewed and updated as appropriate.   Please see review of systems for further details on the patient's review from today.   Objective:   Physical Exam:  BP 124/87   Pulse 72   Physical Exam Constitutional:      General: Tabitha Bennett is not in acute distress. Musculoskeletal:        General: No deformity.  Neurological:     Mental Status: Tabitha Bennett is alert and oriented to person, place, and time.     Coordination: Coordination normal.  Psychiatric:        Attention and Perception: Attention and perception normal. Tabitha Bennett does not perceive auditory or visual hallucinations.        Mood and Affect: Mood normal. Mood is not anxious or depressed. Affect is not labile, blunt, angry or inappropriate.        Speech: Speech normal.        Behavior: Behavior normal.        Thought Content: Thought content normal. Thought content is not paranoid or delusional. Thought content does not include homicidal or suicidal ideation. Thought content does not include homicidal or suicidal plan.        Cognition and Memory: Cognition and memory normal.        Judgment: Judgment normal.     Comments: Insight intact     Lab Review:     Component Value Date/Time   NA 138 05/03/2016 1542   K 3.8 05/03/2016 1542   CL 104 05/03/2016 1542   CO2 26 05/03/2016 1542   GLUCOSE 100 (H) 05/03/2016 1542   BUN 13 05/03/2016 1542   CREATININE 1.02 (H) 05/03/2016 1542   CALCIUM 9.2 05/03/2016 1542   PROT 6.0 11/25/2010 0942   ALBUMIN 3.0 (L) 11/25/2010 0942   AST 21 11/25/2010 0942   ALT 18 11/25/2010 0942   ALKPHOS 43 11/25/2010 0942   BILITOT 0.2 (L) 11/25/2010 0942   GFRNONAA >60 05/03/2016 1542   GFRAA >60 05/03/2016 1542       Component Value Date/Time   WBC 6.6 05/03/2016 1542   RBC 4.09 05/03/2016 1542   HGB 12.8 05/03/2016 1542   HCT 39.5 05/03/2016 1542   PLT 279 05/03/2016 1542   MCV 96.6 05/03/2016 1542   MCH 31.3  05/03/2016 1542   MCHC 32.4 05/03/2016 1542   RDW 12.3 05/03/2016 1542    Lithium Lvl  Date Value Ref Range Status  04/22/2016 <0.06 (L) 0.60 - 1.20 mmol/L Final     No results found for: PHENYTOIN, PHENOBARB, VALPROATE, CBMZ   .res Assessment: Plan:   Will continue current plan of care since target signs and symptoms are well controlled without any tolerability issues. Pt to follow-up in 3 months or sooner if clinically indicated.  Patient advised to contact office with any questions, adverse effects, or acute worsening in signs and symptoms.  Tabitha Bennett was seen today for follow-up.  Diagnoses and all orders for this visit:  Anxiety disorder, unspecified type -  ALPRAZolam (XANAX) 0.5 MG tablet; Take 1 tablet (0.5 mg total) by mouth 3 (three) times daily as needed. -     citalopram (CELEXA) 20 MG tablet; TAKE 1 AND 1/2 TABLETS DAILY BY MOUTH  Bipolar II disorder (HCC) -     citalopram (CELEXA) 20 MG tablet; TAKE 1 AND 1/2 TABLETS DAILY BY MOUTH -     lamoTRIgine (LAMICTAL) 150 MG tablet; Take 1 tablet (150 mg total) by mouth daily. -     lurasidone (LATUDA) 80 MG TABS tablet; TAKE 1/2-1 TABLET DAILY BY MOUTH WITH SUPPER -     traZODone (DESYREL) 50 MG tablet; TAKE 1 TO 2 TABLETS BY MOUTH AT BEDTIME  ADHD, predominantly inattentive type -     lisdexamfetamine (VYVANSE) 60 MG capsule; Take 1 capsule (60 mg total) by mouth daily. -     lisdexamfetamine (VYVANSE) 60 MG capsule; Take 1 capsule (60 mg total) by mouth every morning. -     lisdexamfetamine (VYVANSE) 60 MG capsule; Take 1 capsule (60 mg total) by mouth every morning.  Other orders -     amphetamine-dextroamphetamine (ADDERALL) 10 MG tablet; Take 1/2-1 tab po qd     Please see After Visit Summary for patient specific instructions.  Future Appointments  Date Time Provider Department Center  10/10/2020  9:00 AM Corie Chiquito, PMHNP [facility] None    No orders of the defined types were placed in this  encounter.   -------------------------------

## 2020-08-15 ENCOUNTER — Telehealth: Payer: Self-pay

## 2020-08-15 NOTE — Telephone Encounter (Signed)
Prior Approval received for AMPHETAMINE-DEXTROAMPHETAMINE 10 MG effective 08/01/2020-08/01/2023 with [employer]

## 2020-08-16 ENCOUNTER — Other Ambulatory Visit: Payer: Self-pay

## 2020-08-16 DIAGNOSIS — F900 Attention-deficit hyperactivity disorder, predominantly inattentive type: Secondary | ICD-10-CM

## 2020-08-17 MED ORDER — LISDEXAMFETAMINE DIMESYLATE 60 MG PO CAPS: 60.0000 mg | ORAL_CAPSULE | ORAL | 0 refills | Status: DC

## 2020-08-19 ENCOUNTER — Other Ambulatory Visit: Payer: Self-pay

## 2020-08-19 ENCOUNTER — Telehealth: Payer: Self-pay

## 2020-08-19 DIAGNOSIS — F900 Attention-deficit hyperactivity disorder, predominantly inattentive type: Secondary | ICD-10-CM

## 2020-08-19 NOTE — Telephone Encounter (Signed)
Wow She called and said we sent it to the wrong pharmacy

## 2020-08-19 NOTE — Telephone Encounter (Signed)
Script was printed and given to pt at time of visit.

## 2020-08-23 ENCOUNTER — Other Ambulatory Visit: Payer: Self-pay

## 2020-08-23 DIAGNOSIS — F900 Attention-deficit hyperactivity disorder, predominantly inattentive type: Secondary | ICD-10-CM

## 2020-08-23 MED ORDER — LISDEXAMFETAMINE DIMESYLATE 60 MG PO CAPS: 60.0000 mg | ORAL_CAPSULE | ORAL | 0 refills | Status: DC

## 2020-10-02 ENCOUNTER — Other Ambulatory Visit: Payer: Self-pay | Admitting: Psychiatry

## 2020-10-02 DIAGNOSIS — F3181 Bipolar II disorder: Secondary | ICD-10-CM

## 2020-10-02 DIAGNOSIS — F419 Anxiety disorder, unspecified: Secondary | ICD-10-CM

## 2020-10-10 ENCOUNTER — Encounter: Payer: Self-pay | Admitting: Psychiatry

## 2020-10-10 ENCOUNTER — Other Ambulatory Visit: Payer: Self-pay

## 2020-10-10 ENCOUNTER — Ambulatory Visit (INDEPENDENT_AMBULATORY_CARE_PROVIDER_SITE_OTHER): Payer: 59 | Admitting: Psychiatry

## 2020-10-10 VITALS — BP 111/74 | HR 71

## 2020-10-10 DIAGNOSIS — F3181 Bipolar II disorder: Secondary | ICD-10-CM

## 2020-10-10 DIAGNOSIS — F419 Anxiety disorder, unspecified: Secondary | ICD-10-CM | POA: Diagnosis not present

## 2020-10-10 DIAGNOSIS — F900 Attention-deficit hyperactivity disorder, predominantly inattentive type: Secondary | ICD-10-CM

## 2020-10-10 MED ORDER — MYDAYIS 50 MG PO CP24
50.0000 mg | ORAL_CAPSULE | Freq: Every day | ORAL | 0 refills | Status: DC
Start: 2020-10-10 — End: 2020-10-10

## 2020-10-10 MED ORDER — TRAZODONE HCL 50 MG PO TABS: ORAL_TABLET | ORAL | 0 refills | Status: DC

## 2020-10-10 MED ORDER — LAMOTRIGINE 150 MG PO TABS: 150.0000 mg | ORAL_TABLET | Freq: Every day | ORAL | 1 refills | Status: DC

## 2020-10-10 MED ORDER — MYDAYIS 50 MG PO CP24: 50.0000 mg | ORAL_CAPSULE | Freq: Every morning | ORAL | 0 refills | Status: DC

## 2020-10-10 MED ORDER — CITALOPRAM HYDROBROMIDE 20 MG PO TABS
ORAL_TABLET | ORAL | 2 refills | Status: DC
Start: 2020-10-10 — End: 2020-12-21

## 2020-10-10 MED ORDER — ALPRAZOLAM 0.5 MG PO TABS: 0.5000 mg | ORAL_TABLET | Freq: Three times a day (TID) | ORAL | 2 refills | Status: DC | PRN

## 2020-10-10 NOTE — Progress Notes (Signed)
[facility] [place] [id number] 1965-12-18 55 y.o.  Subjective:   Patient ID:  Tabitha Bennett is a 55 y.o. (DOB January 15, 1966) female.  Chief Complaint:  Chief Complaint  Patient presents with   ADHD   Other    H/o Mood disturbance, anxiety, and insomnia    HPI [facility] presents to the office today for follow-up of mood disorder, anxiety, and ADHD. She reports that she has not tried Adderall because she is concerned she will need to take it chronically. She reports that she tried not taking Vyvanse yesterday to prevent "Vyvanse crash." She reports that she experienced worsening depression yesterday when she did not take Vyvanse. Reports that Vyvanse duration is typically 8 am -12 noon.   Mood is ok in the morning. She reports that her mood is better when she is moving, such as swimming or doing yoga. Denies elevated mood or impulsivity. She reports that her anxiety has been higher than usual. She reports that she would like to increase activity, such as doing volunteer work. Energy and motivation are ok in the morning and then "crashes" mid-day. She reports poor concentration and has difficulty focusing to read. She reports that she has difficulty playing piano due to difficulty grasping information- "I think I have to practice twice as long as someone else." Sleeping well with Trazodone. Appetite has been normal. Denies SI.   She reports that she recently has a brief episode of confusion.  Recently took a trip to [place] and [place]. Husband recently diagnosed with Parkinsons. Will start having grandson, Link, 56 nights a week started August 24th.   Past medication trials: Celexa Lexapro Paxil Prozac Sertraline Effexor Trintellix Wellbutrin Lamictal Vraylar-weight gain Latuda- Reports side effects (akathisia, agitation) with 60 mg.  Rexulti-tremor Adderall XR Vyvanse Xanax Trazodone Buspar  AIMS    Flowsheet Row Office Visit from 07/20/2020 in  [facility] Office Visit from 01/14/2020 in [facility] Office Visit from 09/28/2019 in [facility] Office Visit from 07/06/2019 in [facility]  AIMS Total Score 0 0 0 0        Review of Systems:  Review of Systems  Cardiovascular:  Negative for palpitations.  Musculoskeletal:  Negative for gait problem.  Neurological:  Negative for tremors and headaches.  Psychiatric/Behavioral:         Please refer to HPI   Medications: I have reviewed the patient's current medications.  Current Outpatient Medications  Medication Sig Dispense Refill   Cholecalciferol (VITAMIN D3) 5000 units CAPS Take by mouth.     estradiol (ESTRACE) 0.5 MG tablet Take 0.5 mg by mouth at bedtime.   2   lisdexamfetamine (VYVANSE) 60 MG capsule Take 1 capsule (60 mg total) by mouth every morning. 30 capsule 0   lurasidone (LATUDA) 80 MG TABS tablet TAKE 1/2-1 TABLET DAILY BY MOUTH WITH SUPPER 30 tablet 5   progesterone (PROMETRIUM) 200 MG capsule Take 100 mg by mouth at bedtime.      [START ON 11/07/2020] ALPRAZolam (XANAX) 0.5 MG tablet Take 1 tablet (0.5 mg total) by mouth 3 (three) times daily as needed. 90 tablet 2   Amphet-Dextroamphet 3-Bead ER (MYDAYIS) 50 MG CP24 Take 50 mg by mouth in the morning for 7 days. 7 capsule 0   amphetamine-dextroamphetamine (ADDERALL) 10 MG tablet Take 1/2-1 tab po qd (Patient not taking: Reported on 10/10/2020) 30 tablet 0   citalopram (CELEXA) 20 MG tablet TAKE 1 AND 1/2 TABLETS DAILY BY MOUTH 45 tablet 2  lamoTRIgine (LAMICTAL) 150 MG tablet Take 1 tablet (150 mg total) by mouth daily. 90 tablet 1   lisdexamfetamine (VYVANSE) 60 MG capsule Take 1 capsule (60 mg total) by mouth daily. 30 capsule 0   lisdexamfetamine (VYVANSE) 60 MG capsule Take 1 capsule (60 mg total) by mouth every morning. 30 capsule 0   traZODone (DESYREL) 50 MG tablet TAKE 1 TO 2 TABLETS BY MOUTH AT BEDTIME 180 tablet 0   No current  facility-administered medications for this visit.    Medication Side Effects: Other: "Crash" when Vyvanse wears off.   Allergies: No Known Allergies  Past Medical History:  Diagnosis Date   Bipolar 1 disorder [facility])     Past Medical History, Surgical history, Social history, and Family history were reviewed and updated as appropriate.   Please see review of systems for further details on the patient's review from today.   Objective:   Physical Exam:  BP 111/74   Pulse 71   Physical Exam Constitutional:      General: She is not in acute distress. Musculoskeletal:        General: No deformity.  Neurological:     Mental Status: She is alert and oriented to person, place, and time.     Coordination: Coordination normal.  Psychiatric:        Attention and Perception: Attention and perception normal. She does not perceive auditory or visual hallucinations.        Mood and Affect: Mood is anxious. Mood is not depressed. Affect is not labile, blunt, angry or inappropriate.        Speech: Speech normal.        Behavior: Behavior normal. Behavior is cooperative.        Thought Content: Thought content normal. Thought content is not paranoid or delusional. Thought content does not include homicidal or suicidal ideation. Thought content does not include homicidal or suicidal plan.        Cognition and Memory: Cognition and memory normal.        Judgment: Judgment normal.     Comments: Insight intact    Lab Review:     Component Value Date/Time   NA 138 05/03/2016 1542   K 3.8 05/03/2016 1542   CL 104 05/03/2016 1542   CO2 26 05/03/2016 1542   GLUCOSE 100 (H) 05/03/2016 1542   BUN 13 05/03/2016 1542   CREATININE 1.02 (H) 05/03/2016 1542   CALCIUM 9.2 05/03/2016 1542   PROT 6.0 11/25/2010 0942   ALBUMIN 3.0 (L) 11/25/2010 0942   AST 21 11/25/2010 0942   ALT 18 11/25/2010 0942   ALKPHOS 43 11/25/2010 0942   BILITOT 0.2 (L) 11/25/2010 0942   GFRNONAA >60 05/03/2016 1542    GFRAA >60 05/03/2016 1542       Component Value Date/Time   WBC 6.6 05/03/2016 1542   RBC 4.09 05/03/2016 1542   HGB 12.8 05/03/2016 1542   HCT 39.5 05/03/2016 1542   PLT 279 05/03/2016 1542   MCV 96.6 05/03/2016 1542   MCH 31.3 05/03/2016 1542   MCHC 32.4 05/03/2016 1542   RDW 12.3 05/03/2016 1542    Lithium Lvl  Date Value Ref Range Status  04/22/2016 <0.06 (L) 0.60 - 1.20 mmol/L Final     No results found for: PHENYTOIN, PHENOBARB, VALPROATE, CBMZ   .res Assessment: Plan:    Discussed considering sleep study due to snoring to rule out sleep apnea since sleep apnea can contribute to fatigue and poor concentration.  Encouraged patient  to discuss this with her PCP. Discussed potential benefits, risks, and side effects of Mydayis since this has a longer duration than Vyvanse and may prevent worsening energy, concentration, and mood that she has been experiencing in the afternoons when Vyvanse wears off.  Patient reports that she is willing to try Mydayis with the option to resume Vyvanse with Adderall immediate release if Mydayis is not effective or well-tolerated.  Will write prescription for a 1 week supply of Mydayis for her to start and patient is to follow-up in approximately 1 week to report which option she would prefer, and will write scripts accordingly at that time. Will start trial of Mydayis 50 mg daily for 1 week for ADHD. Continue citalopram 20 to 30 mg daily for anxiety and mood signs and symptoms. Continue Xanax as needed for anxiety. Continue Latuda 40 mg daily with evening meal for mood signs and symptoms. Continue lamotrigine 150 mg daily for mood signs and symptoms. Continue trazodone 50 to 100 mg at bedtime for insomnia. Patient to follow-up in 3 months or sooner if clinically indicated. Patient advised to contact office with any questions, adverse effects, or acute worsening in signs and symptoms.    Tabitha Bennett was seen today for adhd and other.  Diagnoses  and all orders for this visit:  ADHD, predominantly inattentive type -     Discontinue: Amphet-Dextroamphet 3-Bead ER (MYDAYIS) 50 MG CP24; Take 50 mg by mouth daily at 12 noon for 7 days. -     Amphet-Dextroamphet 3-Bead ER (MYDAYIS) 50 MG CP24; Take 50 mg by mouth in the morning for 7 days.  Anxiety disorder, unspecified type -     ALPRAZolam (XANAX) 0.5 MG tablet; Take 1 tablet (0.5 mg total) by mouth 3 (three) times daily as needed. -     citalopram (CELEXA) 20 MG tablet; TAKE 1 AND 1/2 TABLETS DAILY BY MOUTH  Bipolar II disorder (HCC) -     citalopram (CELEXA) 20 MG tablet; TAKE 1 AND 1/2 TABLETS DAILY BY MOUTH -     lamoTRIgine (LAMICTAL) 150 MG tablet; Take 1 tablet (150 mg total) by mouth daily. -     traZODone (DESYREL) 50 MG tablet; TAKE 1 TO 2 TABLETS BY MOUTH AT BEDTIME    Please see After Visit Summary for patient specific instructions.  Future Appointments  Date Time Provider Department Center  01/11/2021  8:30 AM Corie Chiquito, PMHNP [facility] None    No orders of the defined types were placed in this encounter.   -------------------------------

## 2020-10-11 ENCOUNTER — Encounter: Payer: Self-pay | Admitting: Plastic Surgery

## 2020-10-11 ENCOUNTER — Ambulatory Visit (INDEPENDENT_AMBULATORY_CARE_PROVIDER_SITE_OTHER): Payer: Self-pay | Admitting: Plastic Surgery

## 2020-10-11 ENCOUNTER — Other Ambulatory Visit: Payer: Self-pay

## 2020-10-11 DIAGNOSIS — Z719 Counseling, unspecified: Secondary | ICD-10-CM

## 2020-10-11 NOTE — Progress Notes (Signed)
Botulinum Toxin and Filler Injection Procedure Note  Procedure: Cosmetic botulinum toxin and Filler administration  Pre-operative Diagnosis: Dynamic rhytides and midface volume loss  Post-operative Diagnosis: Same  Complications:  None  Brief history: The patient desires botulinum toxin injection of her forehead. I discussed with the patient this proposed procedure of botulinum toxin injections, which is customized depending on the particular needs of the patient. It is performed on facial rhytids as a temporary correction. The alternatives were discussed with the patient. The risks were addressed including bleeding, scarring, infection, damage to deeper structures, asymmetry, and chronic pain, which may occur infrequently after a procedure. The individual's choice to undergo a surgical procedure is based on the comparison of risks to potential benefits. Other risks include unsatisfactory results, brow ptosis, eyelid ptosis, allergic reaction, temporary paralysis, which should go away with time, bruising, blurring disturbances and delayed healing. Botulinum toxin injections do not arrest the aging process or produce permanent tightening of the eyelid.  Operative intervention maybe necessary to maintain the results of a blepharoplasty or botulinum toxin. The patient understands and wishes to proceed.  Procedure: The area was prepped with alcohol and dried with a clean gauze. Using a clean technique, the botulinum toxin was diluted with 1.25 cc of preservative-free normal saline which was slowly injected with an 18 gauge needle in a tuberculin syringes.  A 32 gauge needles were then used to inject the botulinum toxin. This mixture allow for an aliquot of 5 units per 0.1 cc in each injection site.    Subsequently the mixture was injected in the glabellar and central forehead area with preservation of the temporal branch, injected at each lateral canthal area beginning from the lateral orbital rim medial  to the zygomaticus major. A total of 15 Units of botulinum toxin was used. The forehead and glabellar area was injected with care to inject intramuscular only while holding pressure on the supratrochlear vessels in each area during each injection on either side of the medial corrugators.   The midface area was injected at the 3 sub-regions of the mid-face: zygomaticomalar region, anteromedial cheek region, and submalar region for a total of one syringe on each side of the face. The technique used was serial puncture with equal injections in the 3 sub-regions: the zygomaticomalar region, the anteromedial cheek, and the submalar region.  No complications were noted. Light pressure was held for 5 minutes. She was instructed explicitly in post-operative care.  Botox LOT: [id number] EXP: 12/2021  Restylane Lyft x 2  LOT:  [id number] and [id number] EXP:  01-10-2023 and 12-10-2022

## 2020-10-21 ENCOUNTER — Other Ambulatory Visit: Payer: Self-pay

## 2020-10-21 ENCOUNTER — Telehealth: Payer: Self-pay | Admitting: Psychiatry

## 2020-10-21 DIAGNOSIS — F900 Attention-deficit hyperactivity disorder, predominantly inattentive type: Secondary | ICD-10-CM

## 2020-10-21 MED ORDER — MYDAYIS 50 MG PO CP24: 50.0000 mg | ORAL_CAPSULE | Freq: Every morning | ORAL | 0 refills | Status: DC

## 2020-10-21 NOTE — Telephone Encounter (Signed)
PA has been sent. Will need to do a new RX if approved.

## 2020-10-21 NOTE — Telephone Encounter (Signed)
Rudine called to check on status of PA for Mydayis.  It is working well for her.  She did try the afternoon dose of vyvanse and that was terrible.  Made her very agitated.  She will need to be able to get the prescription filled before 8/16 or she will need another vyvanse prescription.  Please do the PA and let her if approved so she'll know what to do.

## 2020-10-27 NOTE — Telephone Encounter (Signed)
PA# [id number]

## 2020-10-27 NOTE — Telephone Encounter (Signed)
I have contacted [facility] [phone] for the past two days being transferred multiple times and hung up on. I did speak to a representative this morning whom saw the pending PA from 08/12. She confirmed everything again over the phone and said we would receive a fax within the next 24 hours with the determination. Not sure why they are having such a delay with her PA.

## 2020-10-28 ENCOUNTER — Other Ambulatory Visit: Payer: Self-pay | Admitting: Psychiatry

## 2020-10-28 DIAGNOSIS — F3181 Bipolar II disorder: Secondary | ICD-10-CM

## 2020-10-28 DIAGNOSIS — F419 Anxiety disorder, unspecified: Secondary | ICD-10-CM

## 2020-10-31 NOTE — Telephone Encounter (Signed)
[facility] finally responded with APPROVAL for MYDAYIS effective 10/27/2020-10/26/2023   Sheralyn Boatman, please let her or her husband know if they aren't already.

## 2020-10-31 NOTE — Telephone Encounter (Signed)
LVM with info

## 2020-10-31 NOTE — Telephone Encounter (Signed)
Pt believes insomnia is also coming from the Buspar.She stated her trazadone is also not helping and she needs something to help her sleep.

## 2020-10-31 NOTE — Telephone Encounter (Signed)
Recommend taking second dose of Buspar earlier to minimize sleep disturbance. How much Trazodone is she taking?

## 2020-10-31 NOTE — Telephone Encounter (Signed)
Pt left note about Mydayis and also that her pharmacy told her she picked up Alprazolam on 10/20/20. Please let her know the controlled substance database indicates it was filled 10/10/20 and not 10/20/20, and she should check with pharmacy.

## 2020-10-31 NOTE — Telephone Encounter (Signed)
Yes, insomnia can occur with benzodiazepine withdrawal and should improve over time. Has she had any increase in BP, HR, sweating, and shaking.

## 2020-10-31 NOTE — Telephone Encounter (Signed)
Pt is taking 2 of the 50 mg trazodone

## 2020-10-31 NOTE — Telephone Encounter (Signed)
Pt stated she lost her xanax,but does not intend on taking it anymore anyway.She is using buspar for now.She stated she has not been able to sleep and wants to know if that is a withdrawal affect of xanax.

## 2020-11-03 ENCOUNTER — Ambulatory Visit (INDEPENDENT_AMBULATORY_CARE_PROVIDER_SITE_OTHER): Payer: 59 | Admitting: Psychiatry

## 2020-11-03 ENCOUNTER — Encounter: Payer: Self-pay | Admitting: Psychiatry

## 2020-11-03 ENCOUNTER — Other Ambulatory Visit: Payer: Self-pay

## 2020-11-03 VITALS — BP 101/69 | HR 73

## 2020-11-03 DIAGNOSIS — F13939 Sedative, hypnotic or anxiolytic use, unspecified with withdrawal, unspecified: Secondary | ICD-10-CM

## 2020-11-03 DIAGNOSIS — F3181 Bipolar II disorder: Secondary | ICD-10-CM

## 2020-11-03 DIAGNOSIS — F13239 Sedative, hypnotic or anxiolytic dependence with withdrawal, unspecified: Secondary | ICD-10-CM

## 2020-11-03 DIAGNOSIS — F419 Anxiety disorder, unspecified: Secondary | ICD-10-CM | POA: Diagnosis not present

## 2020-11-03 DIAGNOSIS — F900 Attention-deficit hyperactivity disorder, predominantly inattentive type: Secondary | ICD-10-CM

## 2020-11-03 MED ORDER — OXCARBAZEPINE 300 MG PO TABS: ORAL_TABLET | ORAL | 1 refills | Status: DC

## 2020-11-03 NOTE — Progress Notes (Signed)
[place] Guastella [id number] [birth year] 55 y.o.  Subjective:   Patient ID:  [facility] Menge is a 55 y.o. (DOB 03/24/1965) female.  Chief Complaint:  Chief Complaint  Patient presents with   Anxiety   ADHD     HPI [facility] presents to the office today for follow-up of anxiety and ADHD.  She is seen emergently for exacerbation of anxiety and irritability.  Reports that she is "terrible" and does not have Vyvanse or Xanax. She suspects that she accidentally threw out her Xanax and has been without it.  Describes significant worsening in anxiety and irritability since being without Xanax.  Reports that she took Mydayis once and states "I opened it up and took some of it out."  She reports that she had some significant anxiety and irritability around that time and is unsure if Mydayis contributed to this or if it was caused by Xanax withdrawal. She reports continued high anxiety and irritability. Reports feeling restless and agitated. Reports some apathy.  She reports adequate sleep overall. Reports that she was unable to sleep once after taking Buspar late in the day. Energy is low. Reports that she has  been withdrawing from others. Poor concentration. Denies disorganized thoughts or delusions. Denies SI.   Has taken Buspar periodically to try to relieve anxiety. Has been taking it since Sunday. Took 5 mg other mornings and took 10 mg this morning.   Lucila Maine has been staying with her.   Past medication trials: Celexa Lexapro Paxil Prozac Sertraline Effexor Trintellix Wellbutrin Lamictal Vraylar-weight gain Latuda- Reports side effects (akathisia, agitation) with 60 mg.  Rexulti-tremor Adderall XR Adderall- Agitation Vyvanse Xanax Trazodone Buspar    AIMS    Flowsheet Row Office Visit from 07/20/2020 in [facility] Office Visit from 01/14/2020 in [facility] Office Visit from 09/28/2019 in [facility] Office Visit from 07/06/2019 in [facility]  AIMS Total Score 0 0 0 0        Review of Systems:  Review of Systems  Constitutional:  Negative for diaphoresis.  Musculoskeletal:  Negative for gait problem.  Neurological:  Negative for tremors.  Psychiatric/Behavioral:         Please refer to HPI   Medications: I have reviewed the patient's current medications.  Current Outpatient Medications  Medication Sig Dispense Refill   Cholecalciferol (VITAMIN D3) 5000 units CAPS Take by mouth.     citalopram (CELEXA) 20 MG tablet TAKE 1 AND 1/2 TABLETS DAILY BY MOUTH (Patient taking differently: Take 20 mg by mouth daily. TAKE 1 AND 1/2 TABLETS DAILY BY MOUTH) 45 tablet 2   estradiol (ESTRACE) 0.5 MG tablet Take by mouth.     lamoTRIgine (LAMICTAL) 150 MG tablet Take by mouth.     lurasidone (LATUDA) 80 MG TABS tablet TAKE 1/2-1 TABLET DAILY BY MOUTH WITH SUPPER 30 tablet 5   Oxcarbazepine (TRILEPTAL) 300 MG tablet Take 1/2 tab po QHS x 3-5 days, then increase to 1 tab po QHS 30 tablet 1   progesterone (PROMETRIUM) 200 MG capsule Take 100 mg by mouth at bedtime.      traZODone (DESYREL) 50 MG tablet TAKE 1 TO 2 TABLETS BY MOUTH AT BEDTIME 180 tablet 0   [START ON 11/07/2020] ALPRAZolam (XANAX) 0.5 MG tablet Take 1 tablet (0.5 mg total) by mouth 3 (three) times daily as needed. (Patient not taking: Reported on 11/03/2020) 90 tablet 2   Amphet-Dextroamphet 3-Bead ER (MYDAYIS) 50 MG CP24 Take 50 mg by  mouth in the morning for 7 days. 30 capsule 0   tretinoin (RETIN-A) 0.1 % cream Apply topically daily. (Patient not taking: Reported on 11/03/2020)     No current facility-administered medications for this visit.    Medication Side Effects: Other: Unsure if Mydayis may have caused increased anxiety and irritability  Allergies:  Allergies  Allergen Reactions   Aripiprazole Other (See Comments)    Pt reports it makes her irritable.    Fluvoxamine Maleate Other (See Comments)     Pt reports it makes her irritable.     Paroxetine Hcl Other (See Comments)    Pt reports it makes her irritable.     Venlafaxine Other (See Comments)    Pt reports it makes her irritable.      Past Medical History:  Diagnosis Date   Bipolar 1 disorder [facility])     Past Medical History, Surgical history, Social history, and Family history were reviewed and updated as appropriate.   Please see review of systems for further details on the patient's review from today.   Objective:   Physical Exam:  BP 101/69   Pulse 73   Physical Exam Constitutional:      General: She is not in acute distress. Musculoskeletal:        General: No deformity.  Neurological:     Mental Status: She is alert and oriented to person, place, and time.     Coordination: Coordination normal.  Psychiatric:        Attention and Perception: Perception normal. She is inattentive. She does not perceive auditory or visual hallucinations.        Mood and Affect: Mood is anxious and depressed. Affect is not labile, blunt, angry or inappropriate.        Speech: Speech normal.        Behavior: Behavior is slowed and withdrawn.        Thought Content: Thought content normal. Thought content is not paranoid or delusional. Thought content does not include homicidal or suicidal ideation. Thought content does not include homicidal or suicidal plan.        Cognition and Memory: Cognition normal. Memory is impaired.        Judgment: Judgment normal.     Comments: Insight intact Dysphoric mood Possible dissociation    Lab Review:     Component Value Date/Time   NA 138 05/03/2016 1542   K 3.8 05/03/2016 1542   CL 104 05/03/2016 1542   CO2 26 05/03/2016 1542   GLUCOSE 100 (H) 05/03/2016 1542   BUN 13 05/03/2016 1542   CREATININE 1.02 (H) 05/03/2016 1542   CALCIUM 9.2 05/03/2016 1542   PROT 6.0 11/25/2010 0942   ALBUMIN 3.0 (L) 11/25/2010 0942   AST 21 11/25/2010 0942   ALT 18 11/25/2010 0942   ALKPHOS 43  11/25/2010 0942   BILITOT 0.2 (L) 11/25/2010 0942   GFRNONAA >60 05/03/2016 1542   GFRAA >60 05/03/2016 1542       Component Value Date/Time   WBC 6.6 05/03/2016 1542   RBC 4.09 05/03/2016 1542   HGB 12.8 05/03/2016 1542   HCT 39.5 05/03/2016 1542   PLT 279 05/03/2016 1542   MCV 96.6 05/03/2016 1542   MCH 31.3 05/03/2016 1542   MCHC 32.4 05/03/2016 1542   RDW 12.3 05/03/2016 1542    Lithium Lvl  Date Value Ref Range Status  04/22/2016 <0.06 (L) 0.60 - 1.20 mmol/L Final     No results found for: PHENYTOIN, PHENOBARB, VALPROATE,  CBMZ   .res Assessment: Plan:    Discussed that she has likely been experiencing benzodiazepine withdrawal signs and symptoms after reportedly misplacing/inadvertently discarding Xanax and this could be causing increased anxiety and irritability.  Agree with patient that it will likely be difficult to determine the effect and tolerability of Mydayis if she continues to experience these signs and symptoms.  Contacted patient's pharmacy at time of visit and requested that they fill alprazolam.  Patient request medication to help with possible withdrawal signs and symptoms, anxiety, and irritability if resuming alprazolam is not helpful or only minimally effective.  Discussed potential benefits, risks, and side effects of Trileptal.  Patient request printed prescription of Trileptal for her to fill if anxiety and irritability do not improve with resuming Xanax. Will continue all other medications as prescribed. Discussed considering retrial of Mydayis once acute anxiety and irritability resolve. Patient to follow-up on November 2 as scheduled. Patient advised to contact office with any questions, adverse effects, or acute worsening in signs and symptoms.    Faithlynn was seen today for anxiety and adhd.  Diagnoses and all orders for this visit:  Benzodiazepine withdrawal with complication (HCC)  Bipolar II disorder (HCC) -     Oxcarbazepine (TRILEPTAL)  300 MG tablet; Take 1/2 tab po QHS x 3-5 days, then increase to 1 tab po QHS  Anxiety disorder, unspecified type  ADHD, predominantly inattentive type    Please see After Visit Summary for patient specific instructions.  Future Appointments  Date Time Provider Department Center  12/30/2020 11:45 AM Dillingham, Alena Bills, DO [facility] None  01/11/2021  8:30 AM Corie Chiquito, PMHNP [facility] None    No orders of the defined types were placed in this encounter.   -------------------------------

## 2020-11-08 ENCOUNTER — Telehealth: Payer: Self-pay | Admitting: Psychiatry

## 2020-11-08 NOTE — Telephone Encounter (Signed)
Pt called and advised current meds prescribed by Shanda Bumps are not working.  She would like a referral to an endocrinologist.

## 2020-11-08 NOTE — Telephone Encounter (Signed)
Please review

## 2020-11-09 NOTE — Telephone Encounter (Signed)
Pt infomed

## 2020-11-22 ENCOUNTER — Telehealth: Payer: Self-pay | Admitting: Psychiatry

## 2020-11-22 DIAGNOSIS — F900 Attention-deficit hyperactivity disorder, predominantly inattentive type: Secondary | ICD-10-CM

## 2020-11-22 NOTE — Telephone Encounter (Signed)
Pt left a message stating that the mydasis is not working anymore. She would like to go back on the adderall but increase the dose since 10 mg is a low dose. Please call her back at [phone]

## 2020-11-23 NOTE — Telephone Encounter (Signed)
Left pt a msg to call back and discuss her medication. Will let Shanda Bumps know in the mean time with her request

## 2020-11-24 ENCOUNTER — Telehealth: Payer: Self-pay | Admitting: Plastic Surgery

## 2020-11-24 NOTE — Telephone Encounter (Signed)
Patient is inquiring about stopping the use of retinol prior to halo laser. Please call to advise [phone]. Thank you.

## 2020-11-25 NOTE — Telephone Encounter (Signed)
Rtc to pt and she reports increasing her Latuda to 60 mg and feels it's working better. She reports with Mydayis that she really isn't having any motivation with it. She does not want to go back on Vyvanse or add Adderall due to causing irritability. She likes the Mydayis but not sure what else to do. Informed her I would update Shanda Bumps to see if she had any recommendations.

## 2020-11-25 NOTE — Telephone Encounter (Signed)
She is on the max dose of Mydayis so there is not an option to go up. Does she want to try it for awhile longer?

## 2020-11-28 MED ORDER — MYDAYIS 50 MG PO CP24: 50.0000 mg | ORAL_CAPSULE | Freq: Every morning | ORAL | 0 refills | Status: DC

## 2020-11-28 NOTE — Telephone Encounter (Signed)
Pt stated yes she would also need a new paper rx that she will come get when she needs it

## 2020-11-28 NOTE — Telephone Encounter (Signed)
Called and spoke with the patient on (11/24/20) regarding the message below.  Informed the patient that I spoke with Bonita,RN, and she stated that she should stop the retinol (1 week to 10 days) prior to the halo laser.  Patient verbalized understanding and agreed.    Patient stated that she had some other questions she would like to discuss.  She stated that she have been researching information on the halo laser and she not sure if the laser will be a good thing for her to have.    Informed the patient that I spoke with Dr. Ulice Bold regarding her having other questions, and she stated that she will give the patient a call back.  Patient verbalized understanding and agreed.//AB/CMA

## 2020-11-28 NOTE — Telephone Encounter (Signed)
Please let pt know that script has been printed and will be left at front desk for her to pick up.

## 2020-12-13 ENCOUNTER — Telehealth: Payer: Self-pay | Admitting: Psychiatry

## 2020-12-13 NOTE — Telephone Encounter (Signed)
Please review

## 2020-12-13 NOTE — Telephone Encounter (Signed)
Pt LVM stating that she is still experiencing depression and lack of motivation.  She said she is taking 60mg  of Latuda, but her med list says it is 80mg .  She ask if she can switch to Prestiq, which her sister uses to see if she can get better results.

## 2020-12-13 NOTE — Telephone Encounter (Signed)
Recommend scheduling earlier follow-up apt to discuss adding new medication.

## 2020-12-13 NOTE — Telephone Encounter (Signed)
Please add to cancellation list or schedule earlier appt if available

## 2020-12-14 NOTE — Telephone Encounter (Signed)
Pt has an appt 10/12

## 2020-12-21 ENCOUNTER — Other Ambulatory Visit: Payer: Self-pay

## 2020-12-21 ENCOUNTER — Ambulatory Visit (INDEPENDENT_AMBULATORY_CARE_PROVIDER_SITE_OTHER): Payer: 59 | Admitting: Psychiatry

## 2020-12-21 ENCOUNTER — Encounter: Payer: Self-pay | Admitting: Psychiatry

## 2020-12-21 DIAGNOSIS — F900 Attention-deficit hyperactivity disorder, predominantly inattentive type: Secondary | ICD-10-CM | POA: Diagnosis not present

## 2020-12-21 DIAGNOSIS — F3181 Bipolar II disorder: Secondary | ICD-10-CM

## 2020-12-21 DIAGNOSIS — F419 Anxiety disorder, unspecified: Secondary | ICD-10-CM | POA: Diagnosis not present

## 2020-12-21 MED ORDER — TRAZODONE HCL 50 MG PO TABS: ORAL_TABLET | ORAL | 0 refills | Status: DC

## 2020-12-21 MED ORDER — LURASIDONE HCL 60 MG PO TABS: 60.0000 mg | ORAL_TABLET | Freq: Every day | ORAL | 0 refills | Status: DC

## 2020-12-21 MED ORDER — DESVENLAFAXINE SUCCINATE ER 25 MG PO TB24: 25.0000 mg | ORAL_TABLET | Freq: Every morning | ORAL | 1 refills | Status: DC

## 2020-12-21 MED ORDER — MYDAYIS 50 MG PO CP24: 50.0000 mg | ORAL_CAPSULE | Freq: Every morning | ORAL | 0 refills | Status: DC

## 2020-12-21 MED ORDER — CITALOPRAM HYDROBROMIDE 20 MG PO TABS: ORAL_TABLET | ORAL | 2 refills | Status: DC

## 2020-12-21 NOTE — Progress Notes (Signed)
[facility] [id number] 03/09/1966 55 y.o.  Subjective:   Patient ID:  [facility] is a 55 y.o. (DOB 1965-11-25) female.  Chief Complaint:  Chief Complaint  Patient presents with   ADHD   Depression   Anxiety     HPI [facility] presents to the office today for follow-up of depression, anxiety, and ADHD. She reports that Mydayis is not helpful. She reports that Vyvanse seemed to only help for a couple hours and then stopped. She reports that she experiences withdrawal s/s without stimulants. She reports, "I am a lot calmer and nicer on Mydayis."   Reports taking Latuda 60 mg and this is causing her stomach to be bigger but denies change in weight. She reports that she does not need to take Xanax as often with higher dose of Latuda.   She reports persistent depression and "some days are worse than others." She reports that her sister takes Pristiq and has done well with this. She asks about switching to Pristiq. She reports that her energy and motivation has been very low. She reports poor concentration and has been unable to read. Sleeping fine. Anxiety is intermittent- "it's fine now but it flares up." She reports that she has had some stressors in response to grandson having difficulties at school and trying to assist him. Denies panic attacks. She reports some occasional chest tightening. Denies SI. Denies impulsivity or risky behavior.   Past medication trials: Celexa Lexapro Paxil Prozac Sertraline Effexor Trintellix Wellbutrin Lamictal Vraylar-weight gain Latuda- Reports side effects (akathisia, agitation) with 60 mg.  Rexulti-tremor Adderall XR Adderall- Agitation Vyvanse Mydayis Ritalin Xanax Trazodone Buspar  AIMS    Flowsheet Row Office Visit from 07/20/2020 in [facility] Office Visit from 01/14/2020 in [facility] Office Visit from 09/28/2019 in [facility] Office Visit from  07/06/2019 in [facility]  AIMS Total Score 0 0 0 0        Review of Systems:  Review of Systems  Gastrointestinal:  Positive for constipation.  Musculoskeletal:  Negative for gait problem.  Neurological:  Negative for tremors.  Psychiatric/Behavioral:         Please refer to HPI   She reports that "now exercising makes me feel worse."   Medications: I have reviewed the patient's current medications.  Current Outpatient Medications  Medication Sig Dispense Refill   ALPRAZolam (XANAX) 0.5 MG tablet Take 1 tablet (0.5 mg total) by mouth 3 (three) times daily as needed. 90 tablet 2   Cholecalciferol (VITAMIN D3) 5000 units CAPS Take by mouth.     Desvenlafaxine Succinate ER 25 MG TB24 Take 25 mg by mouth every morning. 30 tablet 1   estradiol (ESTRACE) 0.5 MG tablet Take by mouth.     lamoTRIgine (LAMICTAL) 150 MG tablet Take by mouth.     progesterone (PROMETRIUM) 200 MG capsule Take 100 mg by mouth at bedtime.      tretinoin (RETIN-A) 0.1 % cream Apply topically daily.     [START ON 12/28/2020] Amphet-Dextroamphet 3-Bead ER (MYDAYIS) 50 MG CP24 Take 50 mg by mouth in the morning for 7 days. 30 capsule 0   citalopram (CELEXA) 20 MG tablet TAKE 1/2 TABLET DAILY FOR ONE WEEK, THEN STOP 45 tablet 2   Lurasidone HCl (LATUDA) 60 MG TABS Take 1 tablet (60 mg total) by mouth daily with supper. 90 tablet 0   traZODone (DESYREL) 50 MG tablet TAKE 1 TO 2 TABLETS BY MOUTH AT BEDTIME 180 tablet  0   No current facility-administered medications for this visit.    Medication Side Effects: Other: Constipation  Allergies:  Allergies  Allergen Reactions   Aripiprazole Other (See Comments)    Pt reports it makes her irritable.    Fluvoxamine Maleate Other (See Comments)    Pt reports it makes her irritable.     Paroxetine Hcl Other (See Comments)    Pt reports it makes her irritable.     Venlafaxine Other (See Comments)    Pt reports it makes her irritable.      Past  Medical History:  Diagnosis Date   Bipolar 1 disorder [facility])     Past Medical History, Surgical history, Social history, and Family history were reviewed and updated as appropriate.   Please see review of systems for further details on the patient's review from today.   Objective:   Physical Exam:  BP 122/72   Pulse 78   Physical Exam Constitutional:      General: She is not in acute distress. Musculoskeletal:        General: No deformity.  Neurological:     Mental Status: She is alert and oriented to person, place, and time.     Coordination: Coordination normal.  Psychiatric:        Attention and Perception: Attention and perception normal. She does not perceive auditory or visual hallucinations.        Mood and Affect: Mood is anxious and depressed. Affect is not labile, blunt, angry or inappropriate.        Speech: Speech normal.        Behavior: Behavior normal.        Thought Content: Thought content normal. Thought content is not paranoid or delusional. Thought content does not include homicidal or suicidal ideation. Thought content does not include homicidal or suicidal plan.        Cognition and Memory: Cognition and memory normal.        Judgment: Judgment normal.     Comments: Insight intact    Lab Review:     Component Value Date/Time   NA 138 05/03/2016 1542   K 3.8 05/03/2016 1542   CL 104 05/03/2016 1542   CO2 26 05/03/2016 1542   GLUCOSE 100 (H) 05/03/2016 1542   BUN 13 05/03/2016 1542   CREATININE 1.02 (H) 05/03/2016 1542   CALCIUM 9.2 05/03/2016 1542   PROT 6.0 11/25/2010 0942   ALBUMIN 3.0 (L) 11/25/2010 0942   AST 21 11/25/2010 0942   ALT 18 11/25/2010 0942   ALKPHOS 43 11/25/2010 0942   BILITOT 0.2 (L) 11/25/2010 0942   GFRNONAA >60 05/03/2016 1542   GFRAA >60 05/03/2016 1542       Component Value Date/Time   WBC 6.6 05/03/2016 1542   RBC 4.09 05/03/2016 1542   HGB 12.8 05/03/2016 1542   HCT 39.5 05/03/2016 1542   PLT 279 05/03/2016  1542   MCV 96.6 05/03/2016 1542   MCH 31.3 05/03/2016 1542   MCHC 32.4 05/03/2016 1542   RDW 12.3 05/03/2016 1542    Lithium Lvl  Date Value Ref Range Status  04/22/2016 <0.06 (L) 0.60 - 1.20 mmol/L Final     No results found for: PHENYTOIN, PHENOBARB, VALPROATE, CBMZ   .res Assessment: Plan:    Pt seen for 30 minutes and time spent discussing her questions about changing medication to target depression and considering Pristiq. Discussed potential benefits, risks, and side effects of Pristiq. Discussed cross-titration of medications to minimize risk  of discontinuation and possible side effects. Pt agrees to trial of Pristiq. Recommended starting with low dose since pt has reported irritability with multiple antidepressants in the past.  Decrease Celexa to 10 mg po qd for one week, then stop. Start Pristiq 25 mg po qd for depression and anxiety.  She reports that she would like to continue with Mydayis since it is currently better tolerated than Vyvanse.  Continue Latuda 60 mg po qd with evening meal for mood stabilization.  Continue Xanax 0.5 mg po TID prn anxiety.  Continue Trazodone 50 mg 1-2 tabs po QHS for insomnia.  Continue Lamictal 150 mg po qd for mood stabilization.  Pt to follow-up in 4 weeks or sooner if clinically indicated.  Patient advised to contact office with any questions, adverse effects, or acute worsening in signs and symptoms.   Joanny was seen today for adhd, depression and anxiety.  Diagnoses and all orders for this visit:  Anxiety disorder, unspecified type -     citalopram (CELEXA) 20 MG tablet; TAKE 1/2 TABLET DAILY FOR ONE WEEK, THEN STOP -     Desvenlafaxine Succinate ER 25 MG TB24; Take 25 mg by mouth every morning.  Bipolar II disorder (HCC) -     citalopram (CELEXA) 20 MG tablet; TAKE 1/2 TABLET DAILY FOR ONE WEEK, THEN STOP -     Lurasidone HCl (LATUDA) 60 MG TABS; Take 1 tablet (60 mg total) by mouth daily with supper. -     traZODone  (DESYREL) 50 MG tablet; TAKE 1 TO 2 TABLETS BY MOUTH AT BEDTIME  ADHD, predominantly inattentive type -     Amphet-Dextroamphet 3-Bead ER (MYDAYIS) 50 MG CP24; Take 50 mg by mouth in the morning for 7 days.    Please see After Visit Summary for patient specific instructions.  Future Appointments  Date Time Provider Department Center  01/18/2021 10:30 AM Corie Chiquito, PMHNP [facility] None  02/13/2021 10:50 AM Shamleffer, Konrad Dolores, MD [facility] None  03/24/2021 10:00 AM Dillingham, Alena Bills, DO [facility] None    No orders of the defined types were placed in this encounter.   -------------------------------

## 2020-12-29 ENCOUNTER — Telehealth: Payer: Self-pay | Admitting: Psychiatry

## 2020-12-29 DIAGNOSIS — F419 Anxiety disorder, unspecified: Secondary | ICD-10-CM

## 2020-12-29 DIAGNOSIS — F3181 Bipolar II disorder: Secondary | ICD-10-CM

## 2020-12-29 MED ORDER — DESVENLAFAXINE SUCCINATE ER 50 MG PO TB24: 50.0000 mg | ORAL_TABLET | Freq: Every day | ORAL | 1 refills | Status: DC

## 2020-12-29 NOTE — Telephone Encounter (Signed)
Patient informed. 

## 2020-12-29 NOTE — Telephone Encounter (Signed)
Patient is not having any side effects with the 20 mg Pristiq, just doesn't feel that it is working.  She states she is not herself, that she cries more and that she has no motivation. The low motivation is not new - states has been an issue since the Vyvanse stopped working. She says she is trying to raise her grandson and needs to be able to function. She is aware she has an appt next month, but states she can't wait until then with her current symptoms.

## 2020-12-29 NOTE — Telephone Encounter (Signed)
LVM for patient to return call. 

## 2020-12-29 NOTE — Telephone Encounter (Signed)
Please let her know script was sent for Pristiq 50 mg since she is tolerating the 25 mg dose without difficulty. Please remind her that it will take at least 2 weeks from start of Pristiq to notice an improvement and she has only been on it about a week. She may start to notice some improvement in the next week or 2.

## 2020-12-29 NOTE — Telephone Encounter (Signed)
Pt LVM stating she is having no problems on 20mg  of Pristiq.  She would like to increase to 40mg .

## 2020-12-29 NOTE — Telephone Encounter (Signed)
Please find out what problems she is having and let [place] know

## 2020-12-29 NOTE — Telephone Encounter (Signed)
Next appt 1/19

## 2020-12-30 ENCOUNTER — Other Ambulatory Visit: Payer: 59 | Admitting: Plastic Surgery

## 2021-01-11 ENCOUNTER — Ambulatory Visit: Payer: 59 | Admitting: Psychiatry

## 2021-01-18 ENCOUNTER — Encounter: Payer: Self-pay | Admitting: Psychiatry

## 2021-01-18 ENCOUNTER — Other Ambulatory Visit: Payer: Self-pay

## 2021-01-18 ENCOUNTER — Ambulatory Visit (INDEPENDENT_AMBULATORY_CARE_PROVIDER_SITE_OTHER): Payer: 59 | Admitting: Psychiatry

## 2021-01-18 DIAGNOSIS — F419 Anxiety disorder, unspecified: Secondary | ICD-10-CM | POA: Diagnosis not present

## 2021-01-18 DIAGNOSIS — F3181 Bipolar II disorder: Secondary | ICD-10-CM | POA: Diagnosis not present

## 2021-01-18 MED ORDER — LAMOTRIGINE 150 MG PO TABS: 150.0000 mg | ORAL_TABLET | Freq: Two times a day (BID) | ORAL | 0 refills | Status: DC

## 2021-01-18 MED ORDER — LURASIDONE HCL 40 MG PO TABS: 40.0000 mg | ORAL_TABLET | Freq: Every day | ORAL | 0 refills | Status: DC

## 2021-01-18 MED ORDER — ALPRAZOLAM 0.5 MG PO TABS: 0.5000 mg | ORAL_TABLET | Freq: Three times a day (TID) | ORAL | 2 refills | Status: DC | PRN

## 2021-01-18 MED ORDER — TRAZODONE HCL 50 MG PO TABS: ORAL_TABLET | ORAL | 0 refills | Status: DC

## 2021-01-18 NOTE — Progress Notes (Signed)
[facility] [id number] [birth year] 55 y.o.  Subjective:   Patient ID:  Tabitha [facility] Yeung is a 55 y.o. (DOB May 18, 1965) female.  Chief Complaint:  Chief Complaint  Patient presents with   Depression   Anxiety   ADHD    Depression        Past medical history includes anxiety.   Anxiety    [facility] presents to the office today for follow-up of depression, anxiety, and ADHD. She reports, "I've been terrible... I have done exactly what I have to do, and that's it" with low motivation and very low energy that has interfered with being able to exercise. She reports that on Sunday "I switched" and had increased energy and goal-directed activity. She had difficulty sleeping that night and was able to sleep with Trazodone. "I feel really sad about everything." She reports limited patience. She reports that she has had high anxiety. Denies panic s/s. Denies any change in appetite. Denies impulsive or risky behavior. Reports that she had passive death wishes. Denies SI.   She reports that she has not seen any change with transition from Citalopram to Pristiq.   She reports that grandson has been with her and her husband the majority of the time.   Past medication trials: Celexa Lexapro Paxil Prozac Sertraline Effexor Pristiq Trintellix Wellbutrin Lamictal Vraylar-weight gain Latuda- Reports side effects (akathisia, agitation) with 60 mg.  Rexulti-tremor Adderall XR Adderall- Agitation Vyvanse Mydayis Ritalin Xanax Trazodone Buspar     AIMS    Flowsheet Row Office Visit from 07/20/2020 in [facility] Office Visit from 01/14/2020 in [facility] Office Visit from 09/28/2019 in [facility] Office Visit from 07/06/2019 in [facility]  AIMS Total Score 0 0 0 0        Review of Systems:  Review of Systems  Gastrointestinal:  Positive for constipation.  Musculoskeletal:  Negative  for gait problem.  Neurological:  Negative for tremors.  Psychiatric/Behavioral:  Positive for depression.        Please refer to HPI   Medications: I have reviewed the patient's current medications.  Current Outpatient Medications  Medication Sig Dispense Refill   Cholecalciferol (VITAMIN D3) 5000 units CAPS Take by mouth.     desvenlafaxine (PRISTIQ) 50 MG 24 hr tablet Take 1 tablet (50 mg total) by mouth daily. 30 tablet 1   estradiol (ESTRACE) 0.5 MG tablet Take by mouth.     lurasidone (LATUDA) 40 MG TABS tablet Take 1 tablet (40 mg total) by mouth daily with supper. 28 tablet 0   progesterone (PROMETRIUM) 200 MG capsule Take 100 mg by mouth at bedtime.      [START ON 01/26/2021] ALPRAZolam (XANAX) 0.5 MG tablet Take 1 tablet (0.5 mg total) by mouth 3 (three) times daily as needed. 90 tablet 2   Amphet-Dextroamphet 3-Bead ER (MYDAYIS) 50 MG CP24 Take 50 mg by mouth in the morning for 7 days. 30 capsule 0   lamoTRIgine (LAMICTAL) 150 MG tablet Take 1 tablet (150 mg total) by mouth 2 (two) times daily. 180 tablet 0   lumateperone tosylate (CAPLYTA) 42 MG capsule Take 1 capsule (42 mg total) by mouth daily. 20 capsule 0   traZODone (DESYREL) 50 MG tablet TAKE 1 TO 2 TABLETS BY MOUTH AT BEDTIME 180 tablet 0   tretinoin (RETIN-A) 0.1 % cream Apply topically daily.     No current facility-administered medications for this visit.    Medication Side Effects: None  Allergies:  Allergies  Allergen Reactions   Aripiprazole Other (See Comments)    Pt reports it makes her irritable.    Fluvoxamine Maleate Other (See Comments)    Pt reports it makes her irritable.     Paroxetine Hcl Other (See Comments)    Pt reports it makes her irritable.     Venlafaxine Other (See Comments)    Pt reports it makes her irritable.      Past Medical History:  Diagnosis Date   Bipolar 1 disorder [facility])     Past Medical History, Surgical history, Social history, and Family history were reviewed and  updated as appropriate.   Please see review of systems for further details on the patient's review from today.   Objective:   Physical Exam:  There were no vitals taken for this visit.  Physical Exam Constitutional:      General: She is not in acute distress. Musculoskeletal:        General: No deformity.  Neurological:     Mental Status: She is alert and oriented to person, place, and time.     Coordination: Coordination normal.  Psychiatric:        Attention and Perception: Attention and perception normal. She does not perceive auditory or visual hallucinations.        Mood and Affect: Mood is anxious and depressed. Affect is tearful. Affect is not labile, blunt, angry or inappropriate.        Speech: Speech normal.        Behavior: Behavior normal.        Thought Content: Thought content normal. Thought content is not paranoid or delusional. Thought content does not include homicidal or suicidal ideation. Thought content does not include homicidal or suicidal plan.        Cognition and Memory: Cognition and memory normal.        Judgment: Judgment normal.     Comments: Insight intact    Lab Review:     Component Value Date/Time   NA 138 05/03/2016 1542   K 3.8 05/03/2016 1542   CL 104 05/03/2016 1542   CO2 26 05/03/2016 1542   GLUCOSE 100 (H) 05/03/2016 1542   BUN 13 05/03/2016 1542   CREATININE 1.02 (H) 05/03/2016 1542   CALCIUM 9.2 05/03/2016 1542   PROT 6.0 11/25/2010 0942   ALBUMIN 3.0 (L) 11/25/2010 0942   AST 21 11/25/2010 0942   ALT 18 11/25/2010 0942   ALKPHOS 43 11/25/2010 0942   BILITOT 0.2 (L) 11/25/2010 0942   GFRNONAA >60 05/03/2016 1542   GFRAA >60 05/03/2016 1542       Component Value Date/Time   WBC 6.6 05/03/2016 1542   RBC 4.09 05/03/2016 1542   HGB 12.8 05/03/2016 1542   HCT 39.5 05/03/2016 1542   PLT 279 05/03/2016 1542   MCV 96.6 05/03/2016 1542   MCH 31.3 05/03/2016 1542   MCHC 32.4 05/03/2016 1542   RDW 12.3 05/03/2016 1542     Lithium Lvl  Date Value Ref Range Status  04/22/2016 <0.06 (L) 0.60 - 1.20 mmol/L Final     No results found for: PHENYTOIN, PHENOBARB, VALPROATE, CBMZ   .res Assessment: Plan:    Discussed decreasing Latuda to 40 mg po qd since lower doses of Latuda can occasionally be more helpful for depression and worsening depression may have occurred around the time Latuda was increased to 60 mg qd. Pt agrees to decrease in [place].  Continue Pristiq 50 mg po qd for depression and anxiety.  Continue  Lamictal 150 mg po qd for mood s/s.  Continue Xanax prn anxiety.  Continue Trazodone 50-100 mg po QHS for insomnia.  Continue Mydayis 50 mg po qd for ADHD.  Pt to follow-up in 4 weeks or sooner if clinically indicated.  Patient advised to contact office with any questions, adverse effects, or acute worsening in signs and symptoms.   Cybele was seen today for depression, anxiety and adhd.  Diagnoses and all orders for this visit:  Anxiety disorder, unspecified type -     ALPRAZolam (XANAX) 0.5 MG tablet; Take 1 tablet (0.5 mg total) by mouth 3 (three) times daily as needed.  Bipolar II disorder (HCC) -     lamoTRIgine (LAMICTAL) 150 MG tablet; Take 1 tablet (150 mg total) by mouth 2 (two) times daily. -     traZODone (DESYREL) 50 MG tablet; TAKE 1 TO 2 TABLETS BY MOUTH AT BEDTIME  Other orders -     lurasidone (LATUDA) 40 MG TABS tablet; Take 1 tablet (40 mg total) by mouth daily with supper.    Please see After Visit Summary for patient specific instructions.  Future Appointments  Date Time Provider Department Center  02/13/2021 10:50 AM Shamleffer, Konrad Dolores, MD [facility] None  02/17/2021 11:30 AM Corie Chiquito, PMHNP [facility] None  03/20/2021 10:00 AM Robley Fries, PhD [facility] None  03/24/2021 10:00 AM Dillingham, Alena Bills, DO [facility] None    No orders of the defined types were placed in this encounter.   -------------------------------

## 2021-01-20 ENCOUNTER — Other Ambulatory Visit: Payer: Self-pay | Admitting: Psychiatry

## 2021-01-20 ENCOUNTER — Telehealth: Payer: Self-pay | Admitting: Psychiatry

## 2021-01-20 DIAGNOSIS — F3181 Bipolar II disorder: Secondary | ICD-10-CM

## 2021-01-20 DIAGNOSIS — F419 Anxiety disorder, unspecified: Secondary | ICD-10-CM

## 2021-01-20 MED ORDER — CAPLYTA 42 MG PO CAPS: 42.0000 mg | ORAL_CAPSULE | Freq: Every day | ORAL | 0 refills | Status: DC

## 2021-01-20 NOTE — Telephone Encounter (Signed)
Called patient with recommendations and let her know samples were available.

## 2021-01-20 NOTE — Telephone Encounter (Signed)
Pt LVM stating  (I think she said [place]), "1 day was too much to take less" and if she could try another med that [place] mentioned.  Next appt 12/9

## 2021-01-20 NOTE — Telephone Encounter (Signed)
We had discussed Caplyta since it is low risk for weight gain and movement side effects. Samples have been pulled and she can come by the office to pick up. Recommend she take Latuda 40 mg for 3 nights, then 20 mg for 3 nights and then stop since she is starting Caplyta. Please tell her to call back to request script for Caplyta if she tolerate it and it seems to be helpful.

## 2021-01-20 NOTE — Telephone Encounter (Signed)
I called patient and she stated she decreased the Latuda but "that isn't going to work." She is more depressed and irritable with the decrease. She said you mentioned another drug she could possibly try and she wants to try that.

## 2021-02-08 ENCOUNTER — Telehealth: Payer: Self-pay | Admitting: Psychiatry

## 2021-02-08 DIAGNOSIS — F3181 Bipolar II disorder: Secondary | ICD-10-CM

## 2021-02-08 MED ORDER — CAPLYTA 42 MG PO CAPS: 42.0000 mg | ORAL_CAPSULE | Freq: Every day | ORAL | 1 refills | Status: DC

## 2021-02-08 NOTE — Telephone Encounter (Signed)
Please let her know script was sent

## 2021-02-08 NOTE — Telephone Encounter (Signed)
Ok to send

## 2021-02-08 NOTE — Telephone Encounter (Signed)
Next visit is 02/17/21. Tabitha Bennett was given Caplyta samples and has taken all of them. It's working well for her and would like an RX called in to:  [id number] [place], [place] - [address]  Phone:  [phone]  Fax:  [phone]

## 2021-02-08 NOTE — Telephone Encounter (Signed)
Pt informed

## 2021-02-12 ENCOUNTER — Other Ambulatory Visit: Payer: Self-pay | Admitting: Psychiatry

## 2021-02-12 DIAGNOSIS — F419 Anxiety disorder, unspecified: Secondary | ICD-10-CM

## 2021-02-13 ENCOUNTER — Other Ambulatory Visit: Payer: Self-pay

## 2021-02-13 ENCOUNTER — Ambulatory Visit (INDEPENDENT_AMBULATORY_CARE_PROVIDER_SITE_OTHER): Payer: 59 | Admitting: Internal Medicine

## 2021-02-13 ENCOUNTER — Encounter: Payer: Self-pay | Admitting: Internal Medicine

## 2021-02-13 VITALS — BP 108/72 | HR 87 | Ht 66.0 in | Wt 132.0 lb

## 2021-02-13 DIAGNOSIS — F341 Dysthymic disorder: Secondary | ICD-10-CM | POA: Diagnosis not present

## 2021-02-13 DIAGNOSIS — R5382 Chronic fatigue, unspecified: Secondary | ICD-10-CM

## 2021-02-13 DIAGNOSIS — E236 Other disorders of pituitary gland: Secondary | ICD-10-CM | POA: Diagnosis not present

## 2021-02-13 DIAGNOSIS — E559 Vitamin D deficiency, unspecified: Secondary | ICD-10-CM | POA: Diagnosis not present

## 2021-02-13 LAB — CORTISOL: Cortisol, Plasma: 5.6 ug/dL

## 2021-02-13 LAB — VITAMIN B12: Vitamin B-12: 1296 pg/mL — ABNORMAL HIGH (ref 211–911)

## 2021-02-13 LAB — FOLLICLE STIMULATING HORMONE: FSH: 70.6 m[IU]/mL

## 2021-02-13 LAB — BASIC METABOLIC PANEL
BUN: 17 mg/dL (ref 6–23)
CO2: 31 mEq/L (ref 19–32)
Calcium: 9.1 mg/dL (ref 8.4–10.5)
Chloride: 103 mEq/L (ref 96–112)
Creatinine, Ser: 1.04 mg/dL (ref 0.40–1.20)
GFR: 60.11 mL/min (ref >60.00)
Glucose, Bld: 79 mg/dL (ref 70–99)
Potassium: 4.5 mEq/L (ref 3.5–5.1)
Sodium: 138 mEq/L (ref 135–145)

## 2021-02-13 LAB — T4, FREE: Free T4: 0.71 ng/dL (ref 0.60–1.60)

## 2021-02-13 LAB — TSH: TSH: 1.18 u[IU]/mL (ref 0.35–5.50)

## 2021-02-13 LAB — VITAMIN D 25 HYDROXY (VIT D DEFICIENCY, FRACTURES): VITD: 29.72 ng/mL — ABNORMAL LOW (ref 30.00–100.00)

## 2021-02-13 NOTE — Progress Notes (Signed)
Name: Tabitha Bennett  MRN/ DOB: [id number], 1965/05/02    Age/ Sex: 55 y.o., female    PCP: Laurann Montana, MD   Reason for Endocrinology Evaluation: Pituitary abnormality      Date of Initial Endocrinology Evaluation: 02/13/2021     HPI: [name] is a 55 y.o. female with a past medical history of Chronic pain syndrome, bipolar disorder and anxiety. The patient presented for initial endocrinology clinic visit on 02/13/2021 for consultative assistance with her Pituitary abnormality .   During evaluation for  headaches she was noted to have a pituitary abnormality on brain MRI. Per pt she does not have any headaches and the MRI was done due to hitting her head against a concert   The pt also tell me that she is here because she would like to rule out secondary depression. She is asking for cortisol and vitamin  checks as well as dopamine    She tells me she has been diagnosed to pituitary abnormality in 2008 and the latest imaging shows enlargement.    Denies headaches  Has visual changes that she attributes to aging  Last eye exam was 2 yrs ago   Denies prior exposure to radiation  Weight stable  She was on OCP's until age 52  NO changes in ring and shoe size  No prior dx of diabetes  or prediabetes  Denies nipple discharge    HISTORY:  Past Medical History:  Past Medical History:  Diagnosis Date   Bipolar 1 disorder (HCC)    Past Surgical History: No past surgical history on file.  Social History:  reports that she has never smoked. She has never used smokeless tobacco. She reports current alcohol use of about 1.0 standard drink per week. She reports that she does not use drugs. Family History: family history is not on file.   HOME MEDICATIONS: Allergies as of 02/13/2021       Reactions   Aripiprazole Other (See Comments)   Pt reports it makes her irritable.    Fluvoxamine Maleate Other (See Comments)   Pt reports it makes her irritable.     Paroxetine Hcl Other (See Comments)   Pt reports it makes her irritable.    Venlafaxine Other (See Comments)   Pt reports it makes her irritable.         Medication List        Accurate as of February 13, 2021 11:16 AM. If you have any questions, ask your nurse or doctor.          STOP taking these medications    lurasidone 40 MG Tabs tablet Commonly known as: Latuda Stopped by: Scarlette Shorts, MD       TAKE these medications    ALPRAZolam 0.5 MG tablet Commonly known as: XANAX Take 1 tablet (0.5 mg total) by mouth 3 (three) times daily as needed.   Caplyta 42 MG capsule Generic drug: lumateperone tosylate Take 1 capsule (42 mg total) by mouth daily.   desvenlafaxine 50 MG 24 hr tablet Commonly known as: PRISTIQ Take 1 tablet (50 mg total) by mouth daily.   estradiol 0.5 MG tablet Commonly known as: ESTRACE Take by mouth.   lamoTRIgine 150 MG tablet Commonly known as: LAMICTAL Take 1 tablet (150 mg total) by mouth 2 (two) times daily.   Mydayis 50 MG Cp24 Generic drug: Amphet-Dextroamphet 3-Bead ER Take 50 mg by mouth in the morning for 7 days.   progesterone 200 MG  capsule Commonly known as: PROMETRIUM Take 100 mg by mouth at bedtime.   traZODone 50 MG tablet Commonly known as: DESYREL TAKE 1 TO 2 TABLETS BY MOUTH AT BEDTIME   tretinoin 0.1 % cream Commonly known as: RETIN-A Apply topically daily.   Vitamin D3 125 MCG (5000 UT) Caps Take by mouth.          REVIEW OF SYSTEMS: A comprehensive ROS was conducted with the patient and is negative except as per HPI   OBJECTIVE:  VS: BP 108/72 (BP Location: Left Arm, Patient Position: Sitting, Cuff Size: Small)   Pulse 87   Ht 5\' 6"  (1.676 m)   Wt 132 lb (59.9 kg)   SpO2 99%   BMI 21.31 kg/m    Wt Readings from Last 3 Encounters:  02/13/21 132 lb (59.9 kg)  04/25/16 129 lb 6.4 oz (58.7 kg)  04/22/16 131 lb 8 oz (59.6 kg)     EXAM: General: Pt appears well and is in NAD   Neck: General: Supple without adenopathy. Thyroid: Thyroid size normal.  No goiter or nodules appreciated.   Lungs: Clear with good BS bilat with no rales, rhonchi, or wheezes  Heart: Auscultation: RRR.  Abdomen: Normoactive bowel sounds, soft, nontender, without masses or organomegaly palpable  Extremities:  BL LE: No pretibial edema normal ROM and strength.  Skin: Hair: Texture and amount normal with gender appropriate distribution Skin Inspection: No rashes Skin Palpation: Skin temperature, texture, and thickness normal to palpation  Mental Status: Judgment, insight: Intact Orientation: Oriented to time, place, and person Mood and affect: No depression, anxiety, or agitation     DATA REVIEWED:     Latest Reference Range & Units 02/13/21 11:22  BASIC METABOLIC PANEL  Rpt  Sodium 135 - 145 mEq/L 138  Potassium 3.5 - 5.1 mEq/L 4.5  Chloride 96 - 112 mEq/L 103  CO2 19 - 32 mEq/L 31  Glucose 70 - 99 mg/dL 79  BUN 6 - 23 mg/dL 17  Creatinine 14/05/22 - 4.01 mg/dL 0.27  Calcium 8.4 - 2.53 mg/dL 9.1  GFR 66.4 mL/min 60.11  VITD 30.00 - 100.00 ng/mL 29.72 (L)  Vitamin B12 211 - 911 pg/mL 1,296 (H)  Cortisol, Plasma ug/dL 5.6  FSH mIU/ML >40.34  Prolactin ng/mL 4.2  TSH 0.35 - 5.50 uIU/mL 1.18  T4,Free(Direct) 0.60 - 1.60 ng/dL 74.2   MRI brain 5.95 Brain: No acute infarction, hemorrhage, hydrocephalus or extra-axial collection.   Scattered foci of T2 hyperintensity are seen within the white matter of the cerebral hemispheres, nonspecific. 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 This was present on prior MRI.   Vascular: Normal flow voids.   Skull and upper cervical spine: Normal marrow signal.   Sinuses/Orbits: Negative.   IMPRESSION: 1. No acute intracranial abnormality. 2. Scattered foci of T2 hyperintensity within the white  matter of the cerebral hemispheres, nonspecific, but may represent early chronic microvascular ischemic changes, demyelinating disease, post inflammatory/infectious processes. 3. 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 This can be better evaluated with MRI of the brain without and with contrast with dedicated pituitary protocol.      ASSESSMENT/PLAN/RECOMMENDATIONS:   Rathke's Cyst:   - Per pt she was diagnosed with a pituitary abnormality since 2008 and that it has increased, in review of the  report , there's no comparison.  - She denies headaches  - Will refer to ophthalmology for visual  field testing  - Will check pituitary hormone profile    2. Fatigue:  We checked Vitamin B12 which is not low. Vit D is slightly low , pt to start OTC Vit D3 1000 iu daily   3. Depression :   - Will defer to psychiatry   F/U in 1 yr  Signed electronically by: Lyndle Herrlich, MD  [facility]  [facility] [address] [place], [place] [address] Phone: [phone] FAX: [phone]   CC: Laurann Montana, MD [id number] [place] [address] Phone: [phone] Fax: [phone]   Return to Endocrinology clinic as below: Future Appointments  Date Time Provider Department Center  02/17/2021 11:30 AM Corie Chiquito, PMHNP CP-CP None  03/20/2021 10:00 AM Robley Fries, PhD CP-CP None  03/24/2021 10:00 AM Dillingham, Alena Bills, DO PSS-PSS None

## 2021-02-14 DIAGNOSIS — R5382 Chronic fatigue, unspecified: Secondary | ICD-10-CM | POA: Insufficient documentation

## 2021-02-14 DIAGNOSIS — F341 Dysthymic disorder: Secondary | ICD-10-CM | POA: Insufficient documentation

## 2021-02-14 DIAGNOSIS — E559 Vitamin D deficiency, unspecified: Secondary | ICD-10-CM | POA: Insufficient documentation

## 2021-02-16 LAB — PROLACTIN: Prolactin: 4.2 ng/mL

## 2021-02-16 LAB — ACTH: C206 ACTH: 6 pg/mL (ref 6–50)

## 2021-02-17 ENCOUNTER — Telehealth: Payer: Self-pay | Admitting: Internal Medicine

## 2021-02-17 ENCOUNTER — Encounter: Payer: Self-pay | Admitting: Psychiatry

## 2021-02-17 ENCOUNTER — Ambulatory Visit (INDEPENDENT_AMBULATORY_CARE_PROVIDER_SITE_OTHER): Payer: 59 | Admitting: Psychiatry

## 2021-02-17 ENCOUNTER — Other Ambulatory Visit: Payer: Self-pay

## 2021-02-17 ENCOUNTER — Other Ambulatory Visit: Payer: Self-pay | Admitting: Internal Medicine

## 2021-02-17 DIAGNOSIS — F900 Attention-deficit hyperactivity disorder, predominantly inattentive type: Secondary | ICD-10-CM | POA: Diagnosis not present

## 2021-02-17 DIAGNOSIS — F3181 Bipolar II disorder: Secondary | ICD-10-CM | POA: Diagnosis not present

## 2021-02-17 DIAGNOSIS — F419 Anxiety disorder, unspecified: Secondary | ICD-10-CM

## 2021-02-17 DIAGNOSIS — E236 Other disorders of pituitary gland: Secondary | ICD-10-CM

## 2021-02-17 MED ORDER — MYDAYIS 50 MG PO CP24: 50.0000 mg | ORAL_CAPSULE | Freq: Every morning | ORAL | 0 refills | Status: DC

## 2021-02-17 MED ORDER — CITALOPRAM HYDROBROMIDE 20 MG PO TABS: 20.0000 mg | ORAL_TABLET | Freq: Every day | ORAL | 2 refills | Status: DC

## 2021-02-17 MED ORDER — LAMOTRIGINE 150 MG PO TABS: 150.0000 mg | ORAL_TABLET | Freq: Two times a day (BID) | ORAL | 0 refills | Status: DC

## 2021-02-17 MED ORDER — LISDEXAMFETAMINE DIMESYLATE 60 MG PO CAPS: 60.0000 mg | ORAL_CAPSULE | ORAL | 0 refills | Status: DC

## 2021-02-17 MED ORDER — TRAZODONE HCL 50 MG PO TABS: ORAL_TABLET | ORAL | 0 refills | Status: DC

## 2021-02-17 MED ORDER — PRAMIPEXOLE DIHYDROCHLORIDE 0.5 MG PO TABS: ORAL_TABLET | ORAL | 1 refills | Status: DC

## 2021-02-17 NOTE — Progress Notes (Signed)
[facility] Alf [id number] 23-Nov-1965 55 y.o.  Subjective:   Patient ID:  Tabitha Bennett is a 55 y.o. (DOB Jun 15, 1965) female.  Chief Complaint:  Chief Complaint  Patient presents with   Depression   Anxiety    HPI Tabitha Bennett presents to the office today for follow-up of depression, anxiety, and ADHD. "Terrible." Reports that Capyta "works the same as the Jordan but not as bad for constipation." Mood has been persistently depressed. Reports that she periodically catches herself holding her breath due to anxiety. Has had anxiety with driving. Low energy and easily fatigued. Reports that her motivation is "the absolute worst." Denies irritability. No change in sleep. Appetite has been decreased. Reports Mydayis helps for about 30 minutes and "that's it." She reports poor concentration. Reports feeling hopeless. Denies SI.  Reports father's birthday is tomorrow and people are supposed to come to her house.   Past medication trials: Celexa Lexapro Paxil Prozac Sertraline Effexor Pristiq Trintellix Wellbutrin Lamictal Vraylar-weight gain Latuda- Reports side effects (akathisia, agitation) with 60 mg.  Rexulti-tremor Caplyta Adderall XR Adderall- Agitation Vyvanse Mydayis Ritalin Xanax Trazodone Buspar     AIMS    Flowsheet Row Office Visit from 07/20/2020 in [facility] Office Visit from 01/14/2020 in [facility] Office Visit from 09/28/2019 in [facility] Office Visit from 07/06/2019 in [facility]  AIMS Total Score 0 0 0 0        Review of Systems:  Review of Systems  Gastrointestinal:  Positive for nausea.  Musculoskeletal:  Negative for gait problem.  Neurological:  Negative for tremors.  Psychiatric/Behavioral:         Please refer to HPI  Dx'd with low Vit D.   Medications: I have reviewed the patient's current medications.  Current Outpatient Medications   Medication Sig Dispense Refill   ALPRAZolam (XANAX) 0.5 MG tablet Take 1 tablet (0.5 mg total) by mouth 3 (three) times daily as needed. 90 tablet 2   Cholecalciferol (VITAMIN D3) 5000 units CAPS Take by mouth.     estradiol (ESTRACE) 0.5 MG tablet Take by mouth.     lisdexamfetamine (VYVANSE) 60 MG capsule Take 1 capsule (60 mg total) by mouth every morning for 7 days. 7 capsule 0   lumateperone tosylate (CAPLYTA) 42 MG capsule Take 1 capsule (42 mg total) by mouth daily. 30 capsule 1   pramipexole (MIRAPEX) 0.5 MG tablet Take 1/2 tablet twice daily for one week, then increase to 1 tablet twice daily if no improvement 60 tablet 1   [START ON 02/24/2021] Amphet-Dextroamphet 3-Bead ER (MYDAYIS) 50 MG CP24 Take 50 mg by mouth in the morning. 30 capsule 0   citalopram (CELEXA) 20 MG tablet Take 1 tablet (20 mg total) by mouth daily. 45 tablet 2   lamoTRIgine (LAMICTAL) 150 MG tablet Take 1 tablet (150 mg total) by mouth 2 (two) times daily. 180 tablet 0   progesterone (PROMETRIUM) 200 MG capsule Take 100 mg by mouth at bedtime.      traZODone (DESYREL) 50 MG tablet TAKE 1 TO 2 TABLETS BY MOUTH AT BEDTIME 180 tablet 0   tretinoin (RETIN-A) 0.1 % cream Apply topically daily.     No current facility-administered medications for this visit.    Medication Side Effects: None  Allergies:  Allergies  Allergen Reactions   Aripiprazole Other (See Comments)    Pt reports it makes her irritable.    Fluvoxamine Maleate Other (See Comments)    Pt reports it  makes her irritable.     Paroxetine Hcl Other (See Comments)    Pt reports it makes her irritable.     Venlafaxine Other (See Comments)    Pt reports it makes her irritable.      Past Medical History:  Diagnosis Date   Bipolar 1 disorder [facility])     Past Medical History, Surgical history, Social history, and Family history were reviewed and updated as appropriate.   Please see review of systems for further details on the patient's review  from today.   Objective:   Physical Exam:  There were no vitals taken for this visit.  Physical Exam Constitutional:      General: She is not in acute distress. Musculoskeletal:        General: No deformity.  Neurological:     Mental Status: She is alert and oriented to person, place, and time.     Coordination: Coordination normal.  Psychiatric:        Attention and Perception: Attention and perception normal. She does not perceive auditory or visual hallucinations.        Mood and Affect: Mood is anxious and depressed. Affect is blunt. Affect is not labile, angry or inappropriate.        Speech: Speech normal.        Behavior: Behavior normal.        Thought Content: Thought content normal. Thought content is not paranoid or delusional. Thought content does not include homicidal or suicidal ideation. Thought content does not include homicidal or suicidal plan.        Cognition and Memory: Cognition and memory normal.        Judgment: Judgment normal.     Comments: Insight intact    Lab Review:     Component Value Date/Time   NA 138 02/13/2021 1122   K 4.5 02/13/2021 1122   CL 103 02/13/2021 1122   CO2 31 02/13/2021 1122   GLUCOSE 79 02/13/2021 1122   BUN 17 02/13/2021 1122   CREATININE 1.04 02/13/2021 1122   CALCIUM 9.1 02/13/2021 1122   PROT 6.0 11/25/2010 0942   ALBUMIN 3.0 (L) 11/25/2010 0942   AST 21 11/25/2010 0942   ALT 18 11/25/2010 0942   ALKPHOS 43 11/25/2010 0942   BILITOT 0.2 (L) 11/25/2010 0942   GFRNONAA >60 05/03/2016 1542   GFRAA >60 05/03/2016 1542       Component Value Date/Time   WBC 6.6 05/03/2016 1542   RBC 4.09 05/03/2016 1542   HGB 12.8 05/03/2016 1542   HCT 39.5 05/03/2016 1542   PLT 279 05/03/2016 1542   MCV 96.6 05/03/2016 1542   MCH 31.3 05/03/2016 1542   MCHC 32.4 05/03/2016 1542   RDW 12.3 05/03/2016 1542    Lithium Lvl  Date Value Ref Range Status  04/22/2016 <0.06 (L) 0.60 - 1.20 mmol/L Final     No results found for:  PHENYTOIN, PHENOBARB, VALPROATE, CBMZ   .res Assessment: Plan:    Patient seen for 30 minutes and time spent discussing treatment options for depression.  Discussed that depressive signs and symptoms seem to worsen after change from citalopram to Pristiq.  She reports that she has not seen any improvement in depressive signs and symptoms with Pristiq and does not want to increase dosage.  Therefore, recommended discontinuing Pristiq and restarting citalopram 20 mg daily.  Patient is in agreement with this plan. Patient request medication to specifically target dopamine.  Discussed potential benefits, risks, and side effects of pramipexole.  Discussed that pramipexole is used off label for bipolar depression.  Patient agrees to trial of pramipexole.  Will start pramipexole 0.25 mg twice daily for 1 week with plan to increase to 0.5 mg daily if she has not experienced any improvement in depressive signs and symptoms and is not having significant side effects. Also discussed stimulants since she has had worsening mood, concentration, and anxiety signs and symptoms since changing stimulant from Vyvanse to Mydayis.  Patient asks if she can restart a 7-day trial of Vyvanse to determine if this is more effective for her.  She also requests a prescription for a 30-day supply of Mydayis to start the following week.  She reports that if her signs and symptoms are improved with Vyvanse that she will contact the office and request a prescription for Vyvanse and will return written prescription for Mydayis.  Will retrial Vyvanse 60 mg daily for 1 week.  continue alprazolam 0.5 mg 3 times daily as needed for anxiety. Continue Lamictal 150 mg twice daily for mood signs and symptoms. Continue (CAPLYTA) 42 mg daily for mood signs and symptoms. Continue trazodone 50 mg 1-2 tabs at bedtime for insomnia. Patient to follow-up in 6 weeks or sooner if clinically indicated. Patient advised to contact office with any questions,  adverse effects, or acute worsening in signs and symptoms.   Tabitha Bennett was seen today for depression and anxiety.  Diagnoses and all orders for this visit:  Anxiety disorder, unspecified type -     citalopram (CELEXA) 20 MG tablet; Take 1 tablet (20 mg total) by mouth daily.  Bipolar II disorder (HCC) -     citalopram (CELEXA) 20 MG tablet; Take 1 tablet (20 mg total) by mouth daily. -     pramipexole (MIRAPEX) 0.5 MG tablet; Take 1/2 tablet twice daily for one week, then increase to 1 tablet twice daily if no improvement -     lamoTRIgine (LAMICTAL) 150 MG tablet; Take 1 tablet (150 mg total) by mouth 2 (two) times daily. -     traZODone (DESYREL) 50 MG tablet; TAKE 1 TO 2 TABLETS BY MOUTH AT BEDTIME  ADHD, predominantly inattentive type -     Amphet-Dextroamphet 3-Bead ER (MYDAYIS) 50 MG CP24; Take 50 mg by mouth in the morning. -     lisdexamfetamine (VYVANSE) 60 MG capsule; Take 1 capsule (60 mg total) by mouth every morning for 7 days.    Please see After Visit Summary for patient specific instructions.  Future Appointments  Date Time Provider Department Bennett  03/24/2021 10:00 AM Dillingham, Alena Bills, DO PSS-PSS None  04/04/2021  1:00 PM Corie Chiquito, PMHNP CP-CP None    No orders of the defined types were placed in this encounter.   -------------------------------

## 2021-02-17 NOTE — Telephone Encounter (Signed)
PLease contact the pt and schedule her for cosyntropin stimulation test . Please let the pt know that her ACTH which is a pituitary hormone is at the lower end of normal and I just want to make sure she makes enough cortisol     Let her know she will be here for 1.5 hrs   A portal message was sent to her just in case    Thanks

## 2021-02-20 NOTE — Telephone Encounter (Signed)
Left a vm for patient to callback to schedule nurse visit and lab appt

## 2021-02-21 NOTE — Telephone Encounter (Signed)
My chart message sent to patient to contact us to schedule nurse visit and lab appt

## 2021-02-24 ENCOUNTER — Other Ambulatory Visit: Payer: Self-pay

## 2021-02-24 ENCOUNTER — Other Ambulatory Visit (INDEPENDENT_AMBULATORY_CARE_PROVIDER_SITE_OTHER): Payer: 59

## 2021-02-24 ENCOUNTER — Other Ambulatory Visit: Payer: Self-pay | Admitting: Psychiatry

## 2021-02-24 DIAGNOSIS — E236 Other disorders of pituitary gland: Secondary | ICD-10-CM

## 2021-02-24 DIAGNOSIS — F900 Attention-deficit hyperactivity disorder, predominantly inattentive type: Secondary | ICD-10-CM

## 2021-02-24 LAB — CORTISOL
Cortisol, Plasma: 12.1 ug/dL
Cortisol, Plasma: 23.4 ug/dL
Cortisol, Plasma: 27.6 ug/dL

## 2021-02-24 MED ORDER — COSYNTROPIN 0.25 MG IJ SOLR
0.2500 mg | Freq: Once | INTRAMUSCULAR | Status: AC
  Administered 2021-02-24: 0.25 mg via INTRAVENOUS

## 2021-02-24 MED ORDER — LISDEXAMFETAMINE DIMESYLATE 60 MG PO CAPS: 60.0000 mg | ORAL_CAPSULE | ORAL | 0 refills | Status: DC

## 2021-02-24 NOTE — Telephone Encounter (Signed)
Patient returned the Mydayis script at approximately 3:10 on 02/24/21. She gave it to Vernona Rieger, who in turn gave it to me. It was shredded and put in shred container by Karin Lieu, CMA.  Vyvanse script pended back to [place].

## 2021-02-24 NOTE — Telephone Encounter (Signed)
Next visit is 04/04/21. Tabitha Bennett is requesting a refill on her Vyvanse 60 mg called to:  [id number] [place], [place] - [address] AT [facility] [address]  Phone:  [phone]  Fax:  [phone]

## 2021-02-24 NOTE — Progress Notes (Signed)
Cosyntropin injection given.

## 2021-02-24 NOTE — Telephone Encounter (Signed)
Pt received handwritten script for Vyvanse for 7 days at last apt and another handwritten script for Mydayis for when she completed it. She said she would return the Mydayis script if she wanted to continue with Mydayis. Has she returned the Mydayis script?

## 2021-03-03 ENCOUNTER — Other Ambulatory Visit: Payer: Self-pay | Admitting: Psychiatry

## 2021-03-03 DIAGNOSIS — F3181 Bipolar II disorder: Secondary | ICD-10-CM

## 2021-03-03 DIAGNOSIS — F419 Anxiety disorder, unspecified: Secondary | ICD-10-CM

## 2021-03-11 ENCOUNTER — Other Ambulatory Visit: Payer: Self-pay | Admitting: Psychiatry

## 2021-03-11 DIAGNOSIS — F3181 Bipolar II disorder: Secondary | ICD-10-CM

## 2021-03-20 ENCOUNTER — Ambulatory Visit: Payer: 59 | Admitting: Psychiatry

## 2021-03-22 ENCOUNTER — Telehealth: Payer: Self-pay | Admitting: Psychiatry

## 2021-03-22 DIAGNOSIS — F900 Attention-deficit hyperactivity disorder, predominantly inattentive type: Secondary | ICD-10-CM

## 2021-03-22 MED ORDER — LISDEXAMFETAMINE DIMESYLATE 60 MG PO CAPS: 60.0000 mg | ORAL_CAPSULE | ORAL | 0 refills | Status: DC

## 2021-03-22 NOTE — Telephone Encounter (Signed)
Please let her know script for [facility]yvanse was sent to [facility] [address]. She had requested a 7-day script in early December to see if Vyvanse would be more helpful than Mydayis. She called back and then requested a 30-day script, which was sent. It looks like pharmacy only filled #23, likely because of insurance not approving more than 30 days for the month. Pharmacy would be unable to dispense the remaining #7 on that script since a partial fill of Vyvanse voids the remainder of the script.

## 2021-03-22 NOTE — Telephone Encounter (Signed)
Called patient back and she is requesting a refill. I told her she wasn't due for a refill yet.  See the information from PMP database. I told her that she received a 30-day supply for December, it was just on 2 different days. She thinks she should get a refill based on the 12/9 date.  I told her that I would send the request to you since you were the one that had to approve it anyway.   02/27/2021  Vyvanse 60 Mg Capsule 23.00 23 [employer]  02/17/2021  Vyvanse 60 Mg Capsule 7.00 7 Je Car  01/27/2021  Alprazolam 0.5 Mg Tablet 90.00 30 [employer] Car  01/27/2021  Mydayis Er 50 Mg Capsule 30.00 30 Je Car  12/29/2020  Alprazolam 0.5 Mg Tablet 90.00 30 Je Car  12/29/2020  Mydayis Er 50 Mg Capsule 30.00 30 Je Car  11/30/2020  Mydayis Er 50 Mg Capsule 30.00 30 [employer] Car  11/03/2020  Alprazolam 0.5 Mg Tablet 90.00 30 Je Car  10/28/2020  Mydayis Er 50 Mg Capsule 30.00 30 [employer]

## 2021-03-22 NOTE — Telephone Encounter (Signed)
Pt LVM that she needs refill of Vyvanse as she was only given a 23 day supply instead of 30.  Next appt 1/24

## 2021-03-24 ENCOUNTER — Other Ambulatory Visit: Payer: Self-pay

## 2021-03-24 ENCOUNTER — Ambulatory Visit (INDEPENDENT_AMBULATORY_CARE_PROVIDER_SITE_OTHER): Payer: Self-pay | Admitting: Plastic Surgery

## 2021-03-24 ENCOUNTER — Telehealth: Payer: Self-pay

## 2021-03-24 ENCOUNTER — Encounter: Payer: Self-pay | Admitting: Plastic Surgery

## 2021-03-24 DIAGNOSIS — Z719 Counseling, unspecified: Secondary | ICD-10-CM

## 2021-03-24 MED ORDER — HYDROCODONE-ACETAMINOPHEN 5-325 MG PO TABS: 1.0000 | ORAL_TABLET | Freq: Three times a day (TID) | ORAL | 0 refills | Status: AC | PRN

## 2021-03-24 NOTE — Progress Notes (Signed)
Preoperative Dx: facial pigmentation  Postoperative Dx:  same  Procedure: laser to face   Anesthesia: none  Description of Procedure:  Risks and complications were explained to the patient. Consent was confirmed and signed. Eye protection was placed. Time out was called and all information was confirmed to be correct. The area  area was prepped with alcohol and wiped dry. The BBL laser was set at 560 nm at 6 J/cm2. The face was lasered.    HALO Treatment     Settings in media.   Topical and/or Block: lidocaine 23% and tetracaine 7%  Post Care: instructions printed  Notes: Patient did great and seemed to have a nice response.  She was quite uncomfortable throughout the procedure.  The depth and energy were adjusted to fit the patient's comfort.  Norco was called in because her pain at the end of the procedure was still very high.

## 2021-03-24 NOTE — Telephone Encounter (Signed)
Patient called to say she is in a lot of pain from the laser today and Dr. Ulice Bold said that she would call a prescription in to her pharmacy.  Patient checked and it isn't there yet.  Patient asked that we please mark this as urgent since she is in so much pain.  Please call.  *[facility] [address] [facility] [place].

## 2021-03-24 NOTE — Telephone Encounter (Signed)
Kim called patient back and advised hydrocodone acetaminophen 5-325 was [facility] to [facility]

## 2021-03-24 NOTE — Addendum Note (Signed)
Addended byKeenan Bachelor on: 03/24/2021 02:29 PM   Modules accepted: Orders

## 2021-03-25 ENCOUNTER — Other Ambulatory Visit: Payer: Self-pay | Admitting: Psychiatry

## 2021-03-25 DIAGNOSIS — F3181 Bipolar II disorder: Secondary | ICD-10-CM

## 2021-03-25 NOTE — Telephone Encounter (Signed)
Patient doing much better.  Red and swollen as expected but no pain.  Not taking pain meds.  She knows to call with any questions.

## 2021-04-04 ENCOUNTER — Encounter: Payer: Self-pay | Admitting: Psychiatry

## 2021-04-04 ENCOUNTER — Ambulatory Visit (INDEPENDENT_AMBULATORY_CARE_PROVIDER_SITE_OTHER): Payer: 59 | Admitting: Psychiatry

## 2021-04-04 ENCOUNTER — Other Ambulatory Visit: Payer: Self-pay

## 2021-04-04 DIAGNOSIS — F900 Attention-deficit hyperactivity disorder, predominantly inattentive type: Secondary | ICD-10-CM | POA: Diagnosis not present

## 2021-04-04 DIAGNOSIS — F419 Anxiety disorder, unspecified: Secondary | ICD-10-CM | POA: Diagnosis not present

## 2021-04-04 DIAGNOSIS — F3181 Bipolar II disorder: Secondary | ICD-10-CM | POA: Diagnosis not present

## 2021-04-04 MED ORDER — CITALOPRAM HYDROBROMIDE 40 MG PO TABS: ORAL_TABLET | ORAL | 0 refills | Status: DC

## 2021-04-04 MED ORDER — LISDEXAMFETAMINE DIMESYLATE 60 MG PO CAPS: 60.0000 mg | ORAL_CAPSULE | ORAL | 0 refills | Status: DC

## 2021-04-04 MED ORDER — LAMOTRIGINE 200 MG PO TABS: 200.0000 mg | ORAL_TABLET | Freq: Every day | ORAL | 1 refills | Status: DC

## 2021-04-04 MED ORDER — CAPLYTA 42 MG PO CAPS: 42.0000 mg | ORAL_CAPSULE | Freq: Every day | ORAL | 2 refills | Status: DC

## 2021-04-04 MED ORDER — TRAZODONE HCL 50 MG PO TABS: ORAL_TABLET | ORAL | 0 refills | Status: DC
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
## 2021-04-27 ENCOUNTER — Other Ambulatory Visit: Payer: Self-pay | Admitting: Psychiatry

## 2021-04-27 DIAGNOSIS — F419 Anxiety disorder, unspecified: Secondary | ICD-10-CM

## 2021-07-24 ENCOUNTER — Other Ambulatory Visit: Payer: Self-pay | Admitting: Psychiatry

## 2021-07-24 DIAGNOSIS — F419 Anxiety disorder, unspecified: Secondary | ICD-10-CM

## 2021-07-25 NOTE — Telephone Encounter (Signed)
Pt's last apt 03/2021 due back 3 months, please get her scheduled with Janett Billow for her apt ?

## 2021-07-26 ENCOUNTER — Other Ambulatory Visit: Payer: Self-pay | Admitting: Plastic Surgery

## 2021-07-26 ENCOUNTER — Other Ambulatory Visit: Payer: Self-pay | Admitting: Psychiatry

## 2021-07-26 ENCOUNTER — Telehealth: Payer: Self-pay | Admitting: Psychiatry

## 2021-07-26 ENCOUNTER — Other Ambulatory Visit: Payer: Self-pay

## 2021-07-26 DIAGNOSIS — F3181 Bipolar II disorder: Secondary | ICD-10-CM

## 2021-07-26 DIAGNOSIS — F900 Attention-deficit hyperactivity disorder, predominantly inattentive type: Secondary | ICD-10-CM

## 2021-07-26 MED ORDER — LISDEXAMFETAMINE DIMESYLATE 60 MG PO CAPS: 60.0000 mg | ORAL_CAPSULE | ORAL | 0 refills | Status: DC

## 2021-07-26 NOTE — Telephone Encounter (Signed)
Patient came past office stating that she went to pharmacy and realized that she doesn't have a prescription for Vyvanse 60mg  She is completely out and would like prescription sent to [facility] Ph: [phone]. Appt 5/24 ?

## 2021-07-26 NOTE — Telephone Encounter (Signed)
Pended.

## 2021-08-02 ENCOUNTER — Ambulatory Visit (INDEPENDENT_AMBULATORY_CARE_PROVIDER_SITE_OTHER): Payer: 59 | Admitting: Psychiatry

## 2021-08-02 ENCOUNTER — Encounter: Payer: Self-pay | Admitting: Psychiatry

## 2021-08-02 DIAGNOSIS — F900 Attention-deficit hyperactivity disorder, predominantly inattentive type: Secondary | ICD-10-CM

## 2021-08-02 DIAGNOSIS — F419 Anxiety disorder, unspecified: Secondary | ICD-10-CM

## 2021-08-02 DIAGNOSIS — F3181 Bipolar II disorder: Secondary | ICD-10-CM | POA: Diagnosis not present

## 2021-08-02 MED ORDER — CAPLYTA 42 MG PO CAPS: 42.0000 mg | ORAL_CAPSULE | Freq: Every day | ORAL | 2 refills | Status: DC

## 2021-08-02 MED ORDER — LISDEXAMFETAMINE DIMESYLATE 60 MG PO CAPS: 60.0000 mg | ORAL_CAPSULE | ORAL | 0 refills | Status: DC

## 2021-08-02 MED ORDER — CITALOPRAM HYDROBROMIDE 40 MG PO TABS: ORAL_TABLET | ORAL | 0 refills | Status: DC

## 2021-08-02 MED ORDER — ALPRAZOLAM 0.5 MG PO TABS: 0.5000 mg | ORAL_TABLET | Freq: Three times a day (TID) | ORAL | 2 refills | Status: DC | PRN

## 2021-08-02 MED ORDER — TRAZODONE HCL 50 MG PO TABS: ORAL_TABLET | ORAL | 0 refills | Status: DC

## 2021-08-02 MED ORDER — LISDEXAMFETAMINE DIMESYLATE 60 MG PO CAPS
60.0000 mg | ORAL_CAPSULE | ORAL | 0 refills | Status: DC
Start: 2021-10-17 — End: 2021-11-22

## 2021-08-02 MED ORDER — LAMOTRIGINE 200 MG PO TABS: 200.0000 mg | ORAL_TABLET | Freq: Every day | ORAL | 1 refills | Status: DC

## 2021-08-02 NOTE — Progress Notes (Signed)
[facility] [place] [id number] Jun 11, 1965 56 y.o.  Subjective:   Patient ID:  Tabitha Bennett is a 56 y.o. (DOB 05/31/1965) female.  Chief Complaint:  Chief Complaint  Patient presents with   Follow-up    Mood disturbance, anxiety, ADHD, and insomnia    HPI [facility] presents to the office today for follow-up of mood disturbance, anxiety, insomnia, and ADHD. "I've been doing much better." She reports that her energy is good and has some anxiety "that brings me down" around 11:30 am and this is relieved with Xanax. She notices she will feel very depressed at 1 pm if she forgets to take Xanax. She reports that otherwise her anxiety is manageable. She reports that her mood has improved and cannot recall when she was last depressed. She reports that concentration is improved with Vyvanse before 11 and that she can concentrate on tasks in the afternoon. She reports that she has been having difficulty keeping up with appointments and has missed her annual physical exam and dentist appointments. She sets 2 alarms for events and sends husband an invite so he can remind her. She reports some irritability that has been manageable. She reports "I have all these ideas running around" about different projects. Denies excessive spending. She reports that she is sleeping ok and sometimes is awakened by arthritis. Appetite has been ok. She lost 6-7 lbs and gained this back. Denies SI.    She will be going to [place] for a week in late June.  Past medication trials: Celexa Lexapro Paxil Prozac Sertraline Effexor Pristiq Trintellix Wellbutrin Lamictal Vraylar-weight gain Latuda- Reports side effects (akathisia, agitation) with 60 mg. Constipation at 60 mg.  Rexulti-tremor Caplyta Adderall XR Adderall- Agitation Vyvanse Mydayis Ritalin Xanax Trazodone Buspar    AIMS    Flowsheet Row Office Visit from 04/04/2021 in [facility] Office Visit from  07/20/2020 in [facility] Office Visit from 01/14/2020 in [facility] Office Visit from 09/28/2019 in [facility] Office Visit from 07/06/2019 in [facility]  AIMS Total Score 0 0 0 0 0        Review of Systems:  Review of Systems  Cardiovascular:  Negative for palpitations.  Musculoskeletal:  Positive for arthralgias. Negative for gait problem.  Neurological:  Negative for tremors.  Psychiatric/Behavioral:         Please refer to HPI   Medications: I have reviewed the patient's current medications.  Current Outpatient Medications  Medication Sig Dispense Refill   Cholecalciferol (VITAMIN D3) 5000 units CAPS Take by mouth.     estradiol (ESTRACE) 0.5 MG tablet Take by mouth.     progesterone (PROMETRIUM) 200 MG capsule Take 100 mg by mouth at bedtime.      [START ON 09/19/2021] ALPRAZolam (XANAX) 0.5 MG tablet Take 1 tablet (0.5 mg total) by mouth 3 (three) times daily as needed. 90 tablet 2   citalopram (CELEXA) 40 MG tablet Take 1/2-1 tab po qd 90 tablet 0   lamoTRIgine (LAMICTAL) 200 MG tablet Take 1 tablet (200 mg total) by mouth daily. 90 tablet 1   [START ON 08/22/2021] lisdexamfetamine (VYVANSE) 60 MG capsule Take 1 capsule (60 mg total) by mouth every morning. 30 capsule 0   [START ON 09/19/2021] lisdexamfetamine (VYVANSE) 60 MG capsule Take 1 capsule (60 mg total) by mouth every morning. 30 capsule 0   [START ON 10/17/2021] lisdexamfetamine (VYVANSE) 60 MG capsule Take 1 capsule (60 mg total) by mouth every morning. 30 capsule 0  lumateperone tosylate (CAPLYTA) 42 MG capsule Take 1 capsule (42 mg total) by mouth daily. 30 capsule 2   traZODone (DESYREL) 50 MG tablet TAKE 1 TO 2 TABLETS BY MOUTH AT BEDTIME 180 tablet 0   tretinoin (RETIN-A) 0.1 % cream Apply topically daily. (Patient not taking: Reported on 04/04/2021)     No current facility-administered medications for this visit.    Medication Side Effects:  None  Allergies:  Allergies  Allergen Reactions   Aripiprazole Other (See Comments)    Pt reports it makes her irritable.    Fluvoxamine Maleate Other (See Comments)    Pt reports it makes her irritable.     Paroxetine Hcl Other (See Comments)    Pt reports it makes her irritable.     Venlafaxine Other (See Comments)    Pt reports it makes her irritable.      Past Medical History:  Diagnosis Date   Bipolar 1 disorder [facility](HCC)     Past Medical History, Surgical history, Social history, and Family history were reviewed and updated as appropriate.   Please see review of systems for further details on the patient's review from today.   Objective:   Physical Exam:  BP 123/81   Pulse 61   Physical Exam Constitutional:      General: She is not in acute distress. Musculoskeletal:        General: No deformity.  Neurological:     Mental Status: She is alert and oriented to person, place, and time.     Coordination: Coordination normal.  Psychiatric:        Attention and Perception: Attention and perception normal. She does not perceive auditory or visual hallucinations.        Mood and Affect: Mood normal. Mood is not anxious or depressed. Affect is not labile, blunt, angry or inappropriate.        Speech: Speech normal.        Behavior: Behavior normal.        Thought Content: Thought content normal. Thought content is not paranoid or delusional. Thought content does not include homicidal or suicidal ideation. Thought content does not include homicidal or suicidal plan.        Cognition and Memory: Cognition and memory normal.        Judgment: Judgment normal.     Comments: Insight intact    Lab Review:     Component Value Date/Time   NA 138 02/13/2021 1122   K 4.5 02/13/2021 1122   CL 103 02/13/2021 1122   CO2 31 02/13/2021 1122   GLUCOSE 79 02/13/2021 1122   BUN 17 02/13/2021 1122   CREATININE 1.04 02/13/2021 1122   CALCIUM 9.1 02/13/2021 1122   PROT 6.0  11/25/2010 0942   ALBUMIN 3.0 (L) 11/25/2010 0942   AST 21 11/25/2010 0942   ALT 18 11/25/2010 0942   ALKPHOS 43 11/25/2010 0942   BILITOT 0.2 (L) 11/25/2010 0942   GFRNONAA >60 05/03/2016 1542   GFRAA >60 05/03/2016 1542       Component Value Date/Time   WBC 6.6 05/03/2016 1542   RBC 4.09 05/03/2016 1542   HGB 12.8 05/03/2016 1542   HCT 39.5 05/03/2016 1542   PLT 279 05/03/2016 1542   MCV 96.6 05/03/2016 1542   MCH 31.3 05/03/2016 1542   MCHC 32.4 05/03/2016 1542   RDW 12.3 05/03/2016 1542    Lithium Lvl  Date Value Ref Range Status  04/22/2016 <0.06 (L) 0.60 - 1.20 mmol/L Final  No results found for: PHENYTOIN, PHENOBARB, VALPROATE, CBMZ   .res Assessment: Plan:    Pt seen for 30 minutes and time spent discussing issue of having enough medication during upcoming trip. Discussed that fill dates for Vvyanse and Alprazolam should fall outside of travel dates. Discussed that office can request pharmacy fill other meds a few days early if needed.  She reports that she would like to continue current medications without changes at this time.  Continue Lamictal 200 mg po qd for mood symptoms. Continue Celexa 20 mg po qd for anxiety and depression.  Continue Caplyta 42 mg po qd for mood s/s. Continue Vyvanse 60 mg po qd for ADHD.  Continue Xanax 0.5 mg po TID prn anxiety.  Continue Trazodone 50-100 mg po QHS for insomnia.  Pt to follow-up in 3 months or sooner if clinically indicated.  Patient advised to contact office with any questions, adverse effects, or acute worsening in signs and symptoms.   Tabitha Bennett was seen today for follow-up.  Diagnoses and all orders for this visit:  ADHD, predominantly inattentive type -     lisdexamfetamine (VYVANSE) 60 MG capsule; Take 1 capsule (60 mg total) by mouth every morning. -     lisdexamfetamine (VYVANSE) 60 MG capsule; Take 1 capsule (60 mg total) by mouth every morning. -     lisdexamfetamine (VYVANSE) 60 MG capsule; Take 1  capsule (60 mg total) by mouth every morning.  Bipolar II disorder (HCC) -     lumateperone tosylate (CAPLYTA) 42 MG capsule; Take 1 capsule (42 mg total) by mouth daily. -     lamoTRIgine (LAMICTAL) 200 MG tablet; Take 1 tablet (200 mg total) by mouth daily. -     citalopram (CELEXA) 40 MG tablet; Take 1/2-1 tab po qd -     traZODone (DESYREL) 50 MG tablet; TAKE 1 TO 2 TABLETS BY MOUTH AT BEDTIME  Anxiety disorder, unspecified type -     citalopram (CELEXA) 40 MG tablet; Take 1/2-1 tab po qd -     ALPRAZolam (XANAX) 0.5 MG tablet; Take 1 tablet (0.5 mg total) by mouth 3 (three) times daily as needed.     Please see After Visit Summary for patient specific instructions.  Future Appointments  Date Time Provider Department Center  08/21/2021 10:00 AM Dillingham, Alena Bills, DO [facility] None  11/03/2021  9:30 AM Corie Chiquito, PMHNP [facility] None    No orders of the defined types were placed in this encounter.   -------------------------------

## 2021-08-16 ENCOUNTER — Telehealth: Payer: Self-pay | Admitting: Psychiatry

## 2021-08-16 NOTE — Telephone Encounter (Signed)
Pt called back and said that she found the medicine so disregard the first message
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
## 2021-08-21 ENCOUNTER — Ambulatory Visit: Payer: Self-pay | Admitting: Plastic Surgery

## 2021-08-21 ENCOUNTER — Other Ambulatory Visit: Payer: Self-pay | Admitting: Psychiatry

## 2021-08-21 ENCOUNTER — Encounter: Payer: Self-pay | Admitting: Plastic Surgery

## 2021-08-21 DIAGNOSIS — Z719 Counseling, unspecified: Secondary | ICD-10-CM

## 2021-08-21 NOTE — Progress Notes (Signed)

## 2021-08-22 NOTE — Telephone Encounter (Signed)
Do you rx this? 

## 2021-10-24 ENCOUNTER — Other Ambulatory Visit: Payer: Self-pay | Admitting: Psychiatry

## 2021-10-24 ENCOUNTER — Telehealth: Payer: Self-pay | Admitting: Psychiatry

## 2021-10-24 DIAGNOSIS — F900 Attention-deficit hyperactivity disorder, predominantly inattentive type: Secondary | ICD-10-CM

## 2021-10-24 DIAGNOSIS — F3181 Bipolar II disorder: Secondary | ICD-10-CM

## 2021-10-24 MED ORDER — LISDEXAMFETAMINE DIMESYLATE 50 MG PO CAPS: 50.0000 mg | ORAL_CAPSULE | Freq: Every day | ORAL | 0 refills | Status: DC

## 2021-10-24 NOTE — Telephone Encounter (Signed)
Patient called stating she is unable to  Adderall and Vyvanse in stock due shortages. She returned call stating [facility] on [address] does have the Vyvanse. A Rx is requested for that location.  Contact # [phone]  Next Appointment 11/03/21

## 2021-10-24 NOTE — Telephone Encounter (Signed)
Called patient to let her know that Shanda Bumps had given her a hard script. She said that wasn't even what the message was about. She said she can't find her dose of Vyvanse in stock and she found 50 mg at [facility] on [address] and wants a Rx sent in. The message was documented received 2 hours ago and patient said they may not have it in stock now. She says she has to have something.

## 2021-10-24 NOTE — Telephone Encounter (Signed)
Rx for 50 mg was sent in. Patient's husband came in to bring the printed Rx for 60 mg and I told him the Rx had been sent.

## 2021-11-03 ENCOUNTER — Ambulatory Visit (INDEPENDENT_AMBULATORY_CARE_PROVIDER_SITE_OTHER): Payer: 59 | Admitting: Psychiatry

## 2021-11-09 MED ORDER — LISDEXAMFETAMINE DIMESYLATE 10 MG PO CAPS: 10.0000 mg | ORAL_CAPSULE | Freq: Every day | ORAL | 0 refills | Status: DC

## 2021-11-09 NOTE — Telephone Encounter (Signed)
Pt informed

## 2021-11-09 NOTE — Telephone Encounter (Signed)
Pt LVM at 9:17a.  She said she is having withdrawal symptoms from having 10mg  less of the Vyvanse.  She said she is sleepy, angry and grumpy.  She wants to know if there is something else that can be added.  Next appt 9/5

## 2021-11-09 NOTE — Telephone Encounter (Signed)
Please review

## 2021-11-09 NOTE — Telephone Encounter (Signed)
Please let her know script for Vyvanse 10 mg was sent to her pharmacy to take in combination with the 50 mg.

## 2021-11-14 ENCOUNTER — Ambulatory Visit: Payer: 59 | Admitting: Psychiatry

## 2021-11-22 ENCOUNTER — Ambulatory Visit (INDEPENDENT_AMBULATORY_CARE_PROVIDER_SITE_OTHER): Payer: 59 | Admitting: Psychiatry

## 2021-11-22 ENCOUNTER — Encounter: Payer: Self-pay | Admitting: Psychiatry

## 2021-11-22 DIAGNOSIS — F3181 Bipolar II disorder: Secondary | ICD-10-CM | POA: Diagnosis not present

## 2021-11-22 DIAGNOSIS — F900 Attention-deficit hyperactivity disorder, predominantly inattentive type: Secondary | ICD-10-CM

## 2021-11-22 DIAGNOSIS — F419 Anxiety disorder, unspecified: Secondary | ICD-10-CM | POA: Diagnosis not present

## 2021-11-22 MED ORDER — TRAZODONE HCL 50 MG PO TABS: ORAL_TABLET | ORAL | 0 refills | Status: DC

## 2021-11-22 MED ORDER — LURASIDONE HCL 40 MG PO TABS: ORAL_TABLET | ORAL | 1 refills | Status: DC

## 2021-11-22 MED ORDER — LISDEXAMFETAMINE DIMESYLATE 60 MG PO CAPS: 60.0000 mg | ORAL_CAPSULE | ORAL | 0 refills | Status: DC

## 2021-11-22 MED ORDER — CITALOPRAM HYDROBROMIDE 40 MG PO TABS: ORAL_TABLET | ORAL | 0 refills | Status: DC

## 2021-11-22 MED ORDER — ALPRAZOLAM 0.5 MG PO TABS: 0.5000 mg | ORAL_TABLET | Freq: Three times a day (TID) | ORAL | 5 refills | Status: DC | PRN

## 2021-11-22 MED ORDER — CAPLYTA 42 MG PO CAPS: 42.0000 mg | ORAL_CAPSULE | Freq: Every day | ORAL | 2 refills | Status: DC

## 2021-11-22 NOTE — Progress Notes (Signed)
[facility] [id number] 1966-01-03 56 y.o.  Subjective:   Patient ID:  Tabitha Bennett is a 56 y.o. (DOB Jan 21, 1966) female.  Chief Complaint:  Chief Complaint  Patient presents with   Depression   Anxiety   Follow-up    Anxiety, ADHD    Depression        Past medical history includes anxiety.   Anxiety     [facility] presents to the office today for follow-up of depression, anxiety, insomnia, and ADHD.   "It's been very busy." Reports that she has been busy with helping grandson with return to school. She reports experiencing some depression. She reports that at times she has not been able to recall some information, such as how she initially met someone. She reports that she frequently forgets what she went into a room to get. She reports that her concentration is not as good and this may be related to increased stressors and more to manage with grandson. Sleeping well. Appetite is less during the day and eats more in the morning and at night. She reports low motivation. She reports that it is a challenge to accomplish different tasks and chores and these things seem to take longer. She reports feeling very tired after having grandson for the weekend. Denies impulsivity or risky behavior.  Denies SI.   She reports that her anxiety has been "alright." Denies panic s/s.   Alprazolam last filled 11/10/21. Vyvanse 50 mg last filled 10/24/21. Vyvanse 10 mg was last filled 11/09/21.  Past medication trials: Celexa Lexapro Paxil Prozac Sertraline Effexor Pristiq Trintellix Wellbutrin Lamictal Vraylar-weight gain Latuda- Reports side effects (akathisia, agitation) with 60 mg. Constipation at 60 mg.  Rexulti-tremor Caplyta Adderall XR Adderall- Agitation Vyvanse Mydayis Ritalin Xanax Trazodone Buspar   AIMS    Flowsheet Row Office Visit from 11/22/2021 in [place] Visit from 04/04/2021 in [place] Visit from 07/20/2020 in [place] Visit from 01/14/2020 in [place] Visit from 09/28/2019 in [place] Total Score 0 0 0 0 0        Review of Systems:  Review of Systems  Gastrointestinal:  Positive for constipation.  Musculoskeletal:  Negative for gait problem.  Neurological:  Negative for tremors.  Psychiatric/Behavioral:  Positive for depression.        Please refer to HPI    Medications: I have reviewed the patient's current medications.  Current Outpatient Medications  Medication Sig Dispense Refill   Cholecalciferol (VITAMIN D3) 5000 units CAPS Take by mouth.     lurasidone (LATUDA) 40 MG TABS tablet Take 1/2-1 tab po daily with supper 90 tablet 1   [START ON 12/08/2021] ALPRAZolam (XANAX) 0.5 MG tablet Take 1 tablet (0.5 mg total) by mouth 3 (three) times daily as needed. 90 tablet 5   citalopram (CELEXA) 40 MG tablet Take 1/2-1 tab po qd 90 tablet 0   estradiol (ESTRACE) 0.5 MG tablet Take by mouth. (Patient not taking: Reported on 11/22/2021)     lisdexamfetamine (VYVANSE) 60 MG capsule Take 1 capsule (60 mg total) by mouth every morning. 30 capsule 0   [START ON 01/17/2022] lisdexamfetamine (VYVANSE) 60 MG capsule Take 1 capsule (60 mg total) by mouth every morning. 30 capsule 0   [START ON 12/21/2021] lisdexamfetamine (VYVANSE) 60 MG capsule Take 1 capsule (60 mg total) by mouth every morning. 30 capsule 0   lumateperone tosylate (CAPLYTA) 42 MG capsule Take 1 capsule (42  mg total) by mouth daily. 30 capsule 2   traZODone (DESYREL) 50 MG tablet TAKE 1 TO 2 TABLETS BY MOUTH AT BEDTIME 180 tablet 0   tretinoin (RETIN-A) 0.1 % cream Apply topically daily. (Patient not taking: Reported on 11/22/2021)     No current facility-administered medications for this visit.    Medication Side Effects: None  Allergies:  Allergies  Allergen Reactions   Aripiprazole Other (See Comments)    Pt  reports it makes her irritable.    Fluvoxamine Maleate Other (See Comments)    Pt reports it makes her irritable.     Paroxetine Hcl Other (See Comments)    Pt reports it makes her irritable.     Venlafaxine Other (See Comments)    Pt reports it makes her irritable.      Past Medical History:  Diagnosis Date   Bipolar 1 disorder [facility])     Past Medical History, Surgical history, Social history, and Family history were reviewed and updated as appropriate.   Please see review of systems for further details on the patient's review from today.   Objective:   Physical Exam:  BP 91/66   Pulse 76   Physical Exam Constitutional:      General: She is not in acute distress. Musculoskeletal:        General: No deformity.  Neurological:     Mental Status: She is alert and oriented to person, place, and time.     Coordination: Coordination normal.  Psychiatric:        Attention and Perception: Attention and perception normal. She does not perceive auditory or visual hallucinations.        Mood and Affect: Mood is not anxious. Affect is not labile, blunt, angry or inappropriate.        Speech: Speech normal.        Behavior: Behavior normal.        Thought Content: Thought content normal. Thought content is not paranoid or delusional. Thought content does not include homicidal or suicidal ideation. Thought content does not include homicidal or suicidal plan.        Cognition and Memory: Cognition and memory normal.        Judgment: Judgment normal.     Comments: Insight intact Mood presents as mildly depressed     Lab Review:     Component Value Date/Time   NA 138 02/13/2021 1122   K 4.5 02/13/2021 1122   CL 103 02/13/2021 1122   CO2 31 02/13/2021 1122   GLUCOSE 79 02/13/2021 1122   BUN 17 02/13/2021 1122   CREATININE 1.04 02/13/2021 1122   CALCIUM 9.1 02/13/2021 1122   PROT 6.0 11/25/2010 0942   ALBUMIN 3.0 (L) 11/25/2010 0942   AST 21 11/25/2010 0942   ALT 18  11/25/2010 0942   ALKPHOS 43 11/25/2010 0942   BILITOT 0.2 (L) 11/25/2010 0942   GFRNONAA >60 05/03/2016 1542   GFRAA >60 05/03/2016 1542       Component Value Date/Time   WBC 6.6 05/03/2016 1542   RBC 4.09 05/03/2016 1542   HGB 12.8 05/03/2016 1542   HCT 39.5 05/03/2016 1542   PLT 279 05/03/2016 1542   MCV 96.6 05/03/2016 1542   MCH 31.3 05/03/2016 1542   MCHC 32.4 05/03/2016 1542   RDW 12.3 05/03/2016 1542    Lithium Lvl  Date Value Ref Range Status  04/22/2016 <0.06 (L) 0.60 - 1.20 mmol/L Final     No results found for: "PHENYTOIN", "PHENOBARB", "  VALPROATE", "CBMZ"   .res Assessment: Plan:   Continue Lamictal 200 mg po qd for mood symptoms. Continue Celexa 20 mg po qd for anxiety and depression.  Continue Caplyta 42 mg po qd for mood s/s. Continue Vyvanse 60 mg po qd for ADHD.  Continue Xanax 0.5 mg po TID prn anxiety.  Continue Trazodone 50-100 mg po QHS for insomnia.  Continue Latuda 20 mg daily for mood stabilization. Pt to follow-up in 3 months or sooner if clinically indicated.  Patient advised to contact office with any questions, adverse effects, or acute worsening in signs and symptoms.    Tabitha Bennett was seen today for depression, anxiety and follow-up.  Diagnoses and all orders for this visit:  ADHD, predominantly inattentive type -     lisdexamfetamine (VYVANSE) 60 MG capsule; Take 1 capsule (60 mg total) by mouth every morning. -     lisdexamfetamine (VYVANSE) 60 MG capsule; Take 1 capsule (60 mg total) by mouth every morning. -     lisdexamfetamine (VYVANSE) 60 MG capsule; Take 1 capsule (60 mg total) by mouth every morning.  Bipolar II disorder (HCC) -     lurasidone (LATUDA) 40 MG TABS tablet; Take 1/2-1 tab po daily with supper -     citalopram (CELEXA) 40 MG tablet; Take 1/2-1 tab po qd -     lumateperone tosylate (CAPLYTA) 42 MG capsule; Take 1 capsule (42 mg total) by mouth daily. -     traZODone (DESYREL) 50 MG tablet; TAKE 1 TO 2 TABLETS BY  MOUTH AT BEDTIME  Anxiety disorder, unspecified type -     citalopram (CELEXA) 40 MG tablet; Take 1/2-1 tab po qd -     ALPRAZolam (XANAX) 0.5 MG tablet; Take 1 tablet (0.5 mg total) by mouth 3 (three) times daily as needed.     Please see After Visit Summary for patient specific instructions.  Future Appointments  Date Time Provider [place]  02/21/2022 10:30 AM Thayer Headings, PMHNP CP-CP None    No orders of the defined types were placed in this encounter.   -------------------------------

## 2021-12-04 ENCOUNTER — Telehealth: Payer: Self-pay | Admitting: Psychiatry

## 2021-12-04 DIAGNOSIS — F900 Attention-deficit hyperactivity disorder, predominantly inattentive type: Secondary | ICD-10-CM

## 2021-12-04 NOTE — Telephone Encounter (Signed)
Pt LVM stating she lost her Vyvanse Rx. Contact pt @ [phone].

## 2021-12-04 NOTE — Telephone Encounter (Signed)
LVM to rtc 

## 2021-12-05 MED ORDER — LISDEXAMFETAMINE DIMESYLATE 60 MG PO CAPS: 60.0000 mg | ORAL_CAPSULE | ORAL | 0 refills | Status: DC

## 2021-12-05 NOTE — Telephone Encounter (Signed)
Vyvanse 60 mg last filled 11/24/21. Will authorize a one week supply at this time to replace lost script in case she finds lost script. Will continue to do a week supply at a time if needed until next scheduled fill date.

## 2021-12-05 NOTE — Telephone Encounter (Signed)
Pt stated she lost her vyvanse and she just picked it up a few days ago.She is asking for a new rx

## 2021-12-05 NOTE — Addendum Note (Signed)
Addended by: Sharyl Nimrod on: 12/05/2021 12:23 PM   Modules accepted: Orders

## 2021-12-28 ENCOUNTER — Other Ambulatory Visit: Payer: Self-pay | Admitting: Psychiatry

## 2021-12-28 ENCOUNTER — Other Ambulatory Visit: Payer: Self-pay

## 2021-12-28 ENCOUNTER — Telehealth: Payer: Self-pay | Admitting: Psychiatry

## 2021-12-28 DIAGNOSIS — F3181 Bipolar II disorder: Secondary | ICD-10-CM

## 2021-12-28 DIAGNOSIS — F419 Anxiety disorder, unspecified: Secondary | ICD-10-CM

## 2021-12-28 DIAGNOSIS — F900 Attention-deficit hyperactivity disorder, predominantly inattentive type: Secondary | ICD-10-CM

## 2021-12-28 MED ORDER — LISDEXAMFETAMINE DIMESYLATE 60 MG PO CAPS: 60.0000 mg | ORAL_CAPSULE | ORAL | 0 refills | Status: DC

## 2021-12-28 NOTE — Telephone Encounter (Signed)
Pt LVM @ 11:28a.  Vyvanse is not available [facility] [address]  [facility]s [facility] [address].  Next appt 12/13

## 2021-12-28 NOTE — Telephone Encounter (Signed)
Pended.

## 2022-01-17 ENCOUNTER — Other Ambulatory Visit: Payer: Self-pay | Admitting: Psychiatry

## 2022-01-17 DIAGNOSIS — F3181 Bipolar II disorder: Secondary | ICD-10-CM

## 2022-02-07 ENCOUNTER — Telehealth: Payer: Self-pay | Admitting: Psychiatry

## 2022-02-07 ENCOUNTER — Other Ambulatory Visit: Payer: Self-pay

## 2022-02-07 DIAGNOSIS — F900 Attention-deficit hyperactivity disorder, predominantly inattentive type: Secondary | ICD-10-CM

## 2022-02-07 MED ORDER — LISDEXAMFETAMINE DIMESYLATE 60 MG PO CAPS: 60.0000 mg | ORAL_CAPSULE | ORAL | 0 refills | Status: DC

## 2022-02-07 NOTE — Telephone Encounter (Signed)
PT called and said that her vyvanse 60  mg [facility] to [facility] [address]. They do have it in stock. She said she has been out of this medicine 5 days and would really like to get it today

## 2022-02-07 NOTE — Telephone Encounter (Signed)
error 

## 2022-02-07 NOTE — Telephone Encounter (Signed)
Pended.

## 2022-02-08 MED ORDER — LISDEXAMFETAMINE DIMESYLATE 50 MG PO CAPS: 50.0000 mg | ORAL_CAPSULE | ORAL | 0 refills | Status: DC

## 2022-02-08 NOTE — Telephone Encounter (Signed)
Script for 50 mg has been printed, signed, and is ready for pickup.

## 2022-02-08 NOTE — Telephone Encounter (Signed)
Spoke to pt and she stated 50 mg will be easier to find

## 2022-02-08 NOTE — Telephone Encounter (Signed)
Please review

## 2022-02-08 NOTE — Telephone Encounter (Signed)
Addendum to attached message. Tabitha Bennett called and said by the time she got to the drug store they had no Vyvanse 60 mg.She would like to get a written RX for 50 mg since it is easier to find? She would like to pick up when ready. Phone number is [phone].

## 2022-02-08 NOTE — Telephone Encounter (Signed)
Please confirm that she is requesting script for 50 mg since this is not her usual dose. Will print script once pt confirms dose.

## 2022-02-14 NOTE — Telephone Encounter (Signed)
I did not prescribe this medication

## 2022-02-16 ENCOUNTER — Encounter: Payer: Self-pay | Admitting: Psychiatry

## 2022-02-16 ENCOUNTER — Ambulatory Visit (INDEPENDENT_AMBULATORY_CARE_PROVIDER_SITE_OTHER): Payer: 59 | Admitting: Psychiatry

## 2022-02-16 VITALS — BP 111/59 | HR 89

## 2022-02-16 DIAGNOSIS — F3181 Bipolar II disorder: Secondary | ICD-10-CM

## 2022-02-16 DIAGNOSIS — F419 Anxiety disorder, unspecified: Secondary | ICD-10-CM | POA: Diagnosis not present

## 2022-02-16 DIAGNOSIS — F900 Attention-deficit hyperactivity disorder, predominantly inattentive type: Secondary | ICD-10-CM | POA: Diagnosis not present

## 2022-02-16 MED ORDER — LISDEXAMFETAMINE DIMESYLATE 40 MG PO CAPS: 40.0000 mg | ORAL_CAPSULE | ORAL | 0 refills | Status: DC

## 2022-02-16 MED ORDER — LISDEXAMFETAMINE DIMESYLATE 20 MG PO CAPS: 20.0000 mg | ORAL_CAPSULE | Freq: Every day | ORAL | 0 refills | Status: DC

## 2022-02-16 MED ORDER — LURASIDONE HCL 60 MG PO TABS: 60.0000 mg | ORAL_TABLET | Freq: Every day | ORAL | 0 refills | Status: DC

## 2022-02-16 MED ORDER — CITALOPRAM HYDROBROMIDE 40 MG PO TABS: ORAL_TABLET | ORAL | 0 refills | Status: DC

## 2022-02-16 MED ORDER — TRAZODONE HCL 50 MG PO TABS: ORAL_TABLET | ORAL | 0 refills | Status: DC

## 2022-02-16 MED ORDER — CAPLYTA 42 MG PO CAPS: 42.0000 mg | ORAL_CAPSULE | Freq: Every day | ORAL | 2 refills | Status: DC

## 2022-02-16 NOTE — Progress Notes (Unsigned)
[facility] [id number] 03-14-1965 56 y.o.  Subjective:   Patient ID:  [name] Tabitha Bennett is a 56 y.o. (DOB 1965/04/12) female.  Chief Complaint:  Chief Complaint  Patient presents with   Other    Irritability   Anxiety    Anxiety     [name] presents to the office today for follow-up of mood disturbance, anxiety, ADHD, and insomnia. She reports increased irritability- "I seem to be yelling at everybody." She reports, "a month ago my sister became psychotic" and was admitted to the hospital and "is still psychotic." She reports, "I've lashed out a some family members because they aren't doing anything to help." She reports that she had to initiate a commitment for her sister. Sister had to be re-admitted and is currently hospitalized. She has had anxiety in response to this situation. She reports that she ran out of Vyvanse for a few days around Thanksgiving and she had severe depression. "I'm not depressed, I'm just angry." She reports that she started crying during recent visit to see her sister in the hospital out of frustration. She reports sleeping well with Trazodone. Appetite has been ok. She denies losing weight. She reports motivation is good in the morning. Feels that Vvyanse "may be running out" mid-day since her energy, motivation, and concentration diminish in the afternoon. She reports that she has poor memory and recall. Denies impulsive or risky behavior. Denies SI.   She reports that she increased Latuda to 60 mg daily 2 days ago.   She reports that she started "trying to go off Vyvanse."   Vyvanse 50 last filled 02/09/22 Alprazolam last filled 01/09/22 x1   Past medication trials: Celexa Lexapro Paxil Prozac Sertraline Effexor Pristiq Trintellix Wellbutrin Lamictal Trileptal Vraylar-weight gain Latuda- Reports side effects (akathisia, agitation) with 60 mg. Constipation at 60 mg.  Rexulti-tremor Caplyta Adderall  XR Adderall- Agitation Vyvanse Mydayis Ritalin Xanax Trazodone Buspar      AIMS    Flowsheet Row Office Visit from 11/22/2021 in [facility] Office Visit from 04/04/2021 in [facility] Office Visit from 07/20/2020 in [facility] Office Visit from 01/14/2020 in [facility] Office Visit from 09/28/2019 in [facility]  AIMS Total Score 0 0 0 0 0        Review of Systems:  Review of Systems  Medications: I have reviewed the patient's current medications.  Current Outpatient Medications  Medication Sig Dispense Refill   ALPRAZolam (XANAX) 0.5 MG tablet Take 1 tablet (0.5 mg total) by mouth 3 (three) times daily as needed. 90 tablet 5   Cholecalciferol (VITAMIN D3) 5000 units CAPS Take by mouth.     citalopram (CELEXA) 40 MG tablet Take 1/2-1 tab po qd 90 tablet 0   estradiol (ESTRACE) 0.5 MG tablet Take by mouth. (Patient not taking: Reported on 11/22/2021)     LATUDA 60 MG TABS TAKE 1 TABLET EVERY DAY WITH SUPPER 90 tablet 0   lisdexamfetamine (VYVANSE) 50 MG capsule Take 1 capsule (50 mg total) by mouth every morning. 30 capsule 0   lisdexamfetamine (VYVANSE) 60 MG capsule Take 1 capsule (60 mg total) by mouth every morning. 30 capsule 0   lisdexamfetamine (VYVANSE) 60 MG capsule Take 1 capsule (60 mg total) by mouth every morning. 7 capsule 0   lisdexamfetamine (VYVANSE) 60 MG capsule Take 1 capsule (60 mg total) by mouth every morning. 30 capsule 0   lumateperone tosylate (CAPLYTA) 42 MG capsule Take 1 capsule (42 mg  total) by mouth daily. 30 capsule 2   lurasidone (LATUDA) 40 MG TABS tablet Take 1/2-1 tab po daily with supper 90 tablet 1   traZODone (DESYREL) 50 MG tablet TAKE 1 TO 2 TABLETS BY MOUTH AT BEDTIME 180 tablet 0   tretinoin (RETIN-A) 0.1 % cream Apply topically daily. (Patient not taking: Reported on 11/22/2021)     No current facility-administered medications for this visit.     Medication Side Effects: None  Allergies:  Allergies  Allergen Reactions   Aripiprazole Other (See Comments)    Pt reports it makes her irritable.    Fluvoxamine Maleate Other (See Comments)    Pt reports it makes her irritable.     Paroxetine Hcl Other (See Comments)    Pt reports it makes her irritable.     Venlafaxine Other (See Comments)    Pt reports it makes her irritable.      Past Medical History:  Diagnosis Date   Bipolar 1 disorder [facility])     Past Medical History, Surgical history, Social history, and Family history were reviewed and updated as appropriate.   Please see review of systems for further details on the patient's review from today.   Objective:   Physical Exam:  There were no vitals taken for this visit.  Physical Exam Constitutional:      General: She is not in acute distress. Musculoskeletal:        General: No deformity.  Neurological:     Mental Status: She is alert and oriented to person, place, and time.     Coordination: Coordination normal.  Psychiatric:        Attention and Perception: Attention and perception normal. She does not perceive auditory or visual hallucinations.        Mood and Affect: Mood is anxious. Mood is not depressed. Affect is angry. Affect is not labile, blunt or inappropriate.        Speech: Speech normal.        Behavior: Behavior normal.        Thought Content: Thought content normal. Thought content is not paranoid or delusional. Thought content does not include homicidal or suicidal ideation. Thought content does not include homicidal or suicidal plan.        Cognition and Memory: Cognition and memory normal.        Judgment: Judgment normal.     Comments: Insight intact     Lab Review:     Component Value Date/Time   NA 138 02/13/2021 1122   K 4.5 02/13/2021 1122   CL 103 02/13/2021 1122   CO2 31 02/13/2021 1122   GLUCOSE 79 02/13/2021 1122   BUN 17 02/13/2021 1122   CREATININE 1.04 02/13/2021  1122   CALCIUM 9.1 02/13/2021 1122   PROT 6.0 11/25/2010 0942   ALBUMIN 3.0 (L) 11/25/2010 0942   AST 21 11/25/2010 0942   ALT 18 11/25/2010 0942   ALKPHOS 43 11/25/2010 0942   BILITOT 0.2 (L) 11/25/2010 0942   GFRNONAA >60 05/03/2016 1542   GFRAA >60 05/03/2016 1542       Component Value Date/Time   WBC 6.6 05/03/2016 1542   RBC 4.09 05/03/2016 1542   HGB 12.8 05/03/2016 1542   HCT 39.5 05/03/2016 1542   PLT 279 05/03/2016 1542   MCV 96.6 05/03/2016 1542   MCH 31.3 05/03/2016 1542   MCHC 32.4 05/03/2016 1542   RDW 12.3 05/03/2016 1542    Lithium Lvl  Date Value Ref Range Status  04/22/2016 <0.06 (L) 0.60 - 1.20 mmol/L Final     No results found for: "PHENYTOIN", "PHENOBARB", "VALPROATE", "CBMZ"   .res Assessment: Plan:    There are no diagnoses linked to this encounter.   Please see After Visit Summary for patient specific instructions.  No future appointments.  No orders of the defined types were placed in this encounter.   -------------------------------

## 2022-02-20 ENCOUNTER — Telehealth: Payer: Self-pay | Admitting: Psychiatry

## 2022-02-20 DIAGNOSIS — F900 Attention-deficit hyperactivity disorder, predominantly inattentive type: Secondary | ICD-10-CM

## 2022-02-20 NOTE — Telephone Encounter (Signed)
Filled vyvanse 50 mg on 12/1

## 2022-02-20 NOTE — Telephone Encounter (Signed)
Pt [facility] on [address] has the brand vyvanse 30 mg in stock. Please send script there

## 2022-02-21 ENCOUNTER — Ambulatory Visit: Payer: 59 | Admitting: Psychiatry

## 2022-02-21 MED ORDER — LISDEXAMFETAMINE DIMESYLATE 30 MG PO CAPS: 30.0000 mg | ORAL_CAPSULE | Freq: Two times a day (BID) | ORAL | 0 refills | Status: DC

## 2022-02-21 NOTE — Addendum Note (Signed)
Addended by: Derenda Mis on: 02/21/2022 04:27 PM   Modules accepted: Orders

## 2022-02-21 NOTE — Telephone Encounter (Signed)
Script sent to [facility] for 15 day supply of 30 mg BID.

## 2022-02-21 NOTE — Telephone Encounter (Signed)
Pt filled 50 mg on 12/1.I called her back to clarify and also saw in the note where you changed rx to 40 mg and 20 mg.The pharmacy does not have those 2 doses so she is requesting vyvanse 30 mg BID #60.Ok to pend even though she picked up the 50 mg?

## 2022-03-23 ENCOUNTER — Ambulatory Visit: Payer: 59 | Admitting: Psychiatry

## 2022-05-09 ENCOUNTER — Ambulatory Visit: Payer: 59 | Admitting: Psychiatry

## 2022-05-17 ENCOUNTER — Other Ambulatory Visit: Payer: Self-pay | Admitting: Psychiatry

## 2022-05-17 DIAGNOSIS — F3181 Bipolar II disorder: Secondary | ICD-10-CM

## 2022-05-18 NOTE — Telephone Encounter (Signed)
Last 2 apts were no shows, she is scheduled 3/19 Last apt was 02/2022

## 2022-05-29 ENCOUNTER — Encounter: Payer: Self-pay | Admitting: Psychiatry

## 2022-05-29 ENCOUNTER — Telehealth: Payer: Self-pay | Admitting: Psychiatry

## 2022-05-29 ENCOUNTER — Ambulatory Visit (INDEPENDENT_AMBULATORY_CARE_PROVIDER_SITE_OTHER): Payer: 59 | Admitting: Psychiatry

## 2022-05-29 DIAGNOSIS — F419 Anxiety disorder, unspecified: Secondary | ICD-10-CM | POA: Diagnosis not present

## 2022-05-29 DIAGNOSIS — F900 Attention-deficit hyperactivity disorder, predominantly inattentive type: Secondary | ICD-10-CM | POA: Diagnosis not present

## 2022-05-29 DIAGNOSIS — F3181 Bipolar II disorder: Secondary | ICD-10-CM | POA: Diagnosis not present

## 2022-05-29 MED ORDER — CAPLYTA 42 MG PO CAPS: 42.0000 mg | ORAL_CAPSULE | Freq: Every day | ORAL | 2 refills | Status: DC

## 2022-05-29 MED ORDER — LISDEXAMFETAMINE DIMESYLATE 40 MG PO CAPS: 40.0000 mg | ORAL_CAPSULE | ORAL | 0 refills | Status: DC

## 2022-05-29 MED ORDER — LISDEXAMFETAMINE DIMESYLATE 60 MG PO CAPS: 60.0000 mg | ORAL_CAPSULE | ORAL | 0 refills | Status: DC

## 2022-05-29 MED ORDER — CITALOPRAM HYDROBROMIDE 40 MG PO TABS: ORAL_TABLET | ORAL | 0 refills | Status: DC

## 2022-05-29 MED ORDER — ALPRAZOLAM 0.5 MG PO TABS: 0.5000 mg | ORAL_TABLET | Freq: Three times a day (TID) | ORAL | 5 refills | Status: DC | PRN

## 2022-05-29 MED ORDER — LAMOTRIGINE 200 MG PO TABS: 200.0000 mg | ORAL_TABLET | Freq: Every day | ORAL | 1 refills | Status: DC

## 2022-05-29 MED ORDER — LISDEXAMFETAMINE DIMESYLATE 20 MG PO CAPS: 20.0000 mg | ORAL_CAPSULE | Freq: Every day | ORAL | 0 refills | Status: DC

## 2022-05-29 MED ORDER — LURASIDONE HCL 60 MG PO TABS: ORAL_TABLET | ORAL | 0 refills | Status: DC

## 2022-05-29 NOTE — Telephone Encounter (Signed)
Please let her know it has been sent.  

## 2022-05-29 NOTE — Telephone Encounter (Signed)
Pt called and said that she needs one script of the vyvanse 60 mg sent to the [facility] pharmacy on [address]. The printed script was going to cost her 800 dollars.

## 2022-05-29 NOTE — Progress Notes (Signed)
[facility] [place] [id number] [birth year] 57 y.o.  Subjective:   Patient ID:  Tabitha Bennett is a 57 y.o. (DOB Jul 26, 1965) female.  Chief Complaint: No chief complaint on file.   HPI Tabitha Bennett presents to the office today for follow-up of anxiety, mood disturbance, and ADHD. She reports, "I have a lot of depression in the afternoon." She reports increased depression recently. She reports low energy and motivation. She reports there was an event she would typically have enjoyed, but did not decide to do. She reports some worry. Denies panic.   Husband has Parkinson's disease and has had worsening symptoms recently. She worries about "how am I going to take care of him if I don't have a brain." She reports that she sometimes has difficulty with word finding and will stop mid-sentence. She reports, "My piano has really been helping me" with focus. She reports that her sleep has been "good." Typically sleeps about 7 hours a night. Appetite has been lower. Denies any change in weight. Denies SI.   She reports that she "goes back and forth" with Caplyta and [place] based on side effects. She reports that she has sexual side effects with Caplyta and constipation with Latuda. Typically takes Latuda 40 mg daily and occasionally more. Has not taken Latuda for about the last 5 days and feels that movements may be worse since then.   She reports impulsivity recently and reports that this is unusual for her. Has had thoughts of wanting to gamble.   Stopped estradiol.   Xanax last filled 05/03/22.   Past medication trials: Celexa Lexapro Paxil Prozac Sertraline Effexor Pristiq Trintellix Wellbutrin Lamictal Trileptal Vraylar-weight gain Latuda- Reports side effects (akathisia, agitation) with 60 mg. Constipation at 60 mg.  Rexulti-tremor Caplyta Adderall XR Adderall- Agitation Vyvanse Mydayis Ritalin Xanax Trazodone Buspar  AIMS    Flowsheet Row Office  Visit from 02/16/2022 in [place] Office Visit from 11/22/2021 in [facility] Office Visit from 04/04/2021 in [place] Office Visit from 07/20/2020 in [place] Office Visit from 01/14/2020 in [facility]  AIMS Total Score 0 0 0 0 0        Review of Systems:  Review of Systems  Musculoskeletal:  Negative for gait problem.  Neurological:  Negative for tremors.  Psychiatric/Behavioral:         Please refer to HPI    Medications: I have reviewed the patient's current medications.  Current Outpatient Medications  Medication Sig Dispense Refill   ALPRAZolam (XANAX) 0.5 MG tablet Take 1 tablet (0.5 mg total) by mouth 3 (three) times daily as needed. 90 tablet 5   Cholecalciferol (VITAMIN D3) 5000 units CAPS Take by mouth. (Patient not taking: Reported on 02/16/2022)     citalopram (CELEXA) 40 MG tablet Take 1/2-1 tab po qd 90 tablet 0   estradiol (ESTRACE) 0.5 MG tablet Take by mouth. (Patient not taking: Reported on 11/22/2021)     lisdexamfetamine (VYVANSE) 20 MG capsule Take 1 capsule (20 mg total) by mouth daily after lunch. 30 capsule 0   lisdexamfetamine (VYVANSE) 30 MG capsule Take 1 capsule (30 mg total) by mouth 2 (two) times daily for 15 days. 30 capsule 0   lisdexamfetamine (VYVANSE) 40 MG capsule Take 1 capsule (40 mg total) by mouth every morning. 30 capsule 0   lumateperone tosylate (CAPLYTA) 42 MG capsule Take 1 capsule (42 mg total) by mouth daily. [place]  capsule 2   Lurasidone HCl 60 MG TABS TAKE 1 TABLET (60 MG TOTAL) BY MOUTH DAILY WITH SUPPER. 90 tablet 0   traZODone (DESYREL) 50 MG tablet TAKE 1 TO 2 TABLETS BY MOUTH AT BEDTIME 180 tablet 0   tretinoin (RETIN-A) 0.1 % cream Apply topically daily. (Patient not taking: Reported on 11/22/2021)     No current facility-administered medications for this visit.    Medication Side  Effects: Other: orofacial movements  Allergies:  Allergies  Allergen Reactions   Aripiprazole Other (See Comments)    Pt reports it makes her irritable.    Fluvoxamine Maleate Other (See Comments)    Pt reports it makes her irritable.     Paroxetine Hcl Other (See Comments)    Pt reports it makes her irritable.     Venlafaxine Other (See Comments)    Pt reports it makes her irritable.      Past Medical History:  Diagnosis Date   Bipolar 1 disorder [facility])     Past Medical History, Surgical history, Social history, and Family history were reviewed and updated as appropriate.   Please see review of systems for further details on the patient's review from today.   Objective:   Physical Exam:  There were no vitals taken for this visit.  Physical Exam  Lab Review:     Component Value Date/Time   NA 138 02/13/2021 1122   K 4.5 02/13/2021 1122   CL 103 02/13/2021 1122   CO2 31 02/13/2021 1122   GLUCOSE 79 02/13/2021 1122   BUN 17 02/13/2021 1122   CREATININE 1.04 02/13/2021 1122   CALCIUM 9.1 02/13/2021 1122   PROT 6.0 11/25/2010 0942   ALBUMIN 3.0 (L) 11/25/2010 0942   AST 21 11/25/2010 0942   ALT 18 11/25/2010 0942   ALKPHOS 43 11/25/2010 0942   BILITOT 0.2 (L) 11/25/2010 0942   GFRNONAA >60 05/03/2016 1542   GFRAA >60 05/03/2016 1542       Component Value Date/Time   WBC 6.6 05/03/2016 1542   RBC 4.09 05/03/2016 1542   HGB 12.8 05/03/2016 1542   HCT 39.5 05/03/2016 1542   PLT 279 05/03/2016 1542   MCV 96.6 05/03/2016 1542   MCH 31.3 05/03/2016 1542   MCHC 32.4 05/03/2016 1542   RDW 12.3 05/03/2016 1542    Lithium Lvl  Date Value Ref Range Status  04/22/2016 <0.06 (L) 0.60 - 1.20 mmol/L Final     No results found for: "PHENYTOIN", "PHENOBARB", "VALPROATE", "CBMZ"   .res Assessment: Plan:    There are no diagnoses linked to this encounter.   Please see After Visit Summary for patient specific instructions.  No future appointments.  No orders  of the defined types were placed in this encounter.   -------------------------------

## 2022-06-06 ENCOUNTER — Telehealth: Payer: Self-pay | Admitting: Psychiatry

## 2022-06-06 DIAGNOSIS — F3181 Bipolar II disorder: Secondary | ICD-10-CM

## 2022-06-06 MED ORDER — CAPLYTA 21 MG PO CAPS: 21.0000 mg | ORAL_CAPSULE | Freq: Every day | ORAL | 0 refills | Status: DC

## 2022-06-06 NOTE — Telephone Encounter (Signed)
Patient notified of Rx and possible causes of sx.  She said she takes [place], not both. She has been taking only Caplyta for the past 12 days. Asked that she let us know next week if she is still having sx.

## 2022-06-06 NOTE — Telephone Encounter (Signed)
Patient with c/o agitation and body/leg movements. She said she isn't sure if the agitation is causing the movements or the movements is causing the agitation. She has no sleep issues. She said she also notices that she holds her breath. She said this was not new and that you were aware of this. She also reports the Xanax doesn't seem to be working. She is prescribed 0.5mg  TID PRN. I couldn't get her to quantify how much she is taking, but said she doesn't always take at night because she takes trazodone. She questions if she needs to increase Xanax to help with current sx.  She thought sx may be related to [place]. She had no other complaints.

## 2022-06-06 NOTE — Telephone Encounter (Signed)
Tabitha Bennett next appt is 08/29/22. She called stating she is very agitated and doesn't feel good. Wondering if it is one of her meds. States Caplyta and says that her Xanax wont help her. She has a lot of movement with her body, especially her legs. Phone number is [phone].

## 2022-06-06 NOTE — Telephone Encounter (Signed)
Please let her know Caplyta is now available in a lower dose and that a script was sent in for her to try Caplyta 21 mg to possibly minimize side effects. Also, please ask her about how much [place] she is taking since the side effects she is describing can also be related to [place] or the combination of Latuda and Caplyta.

## 2022-06-26 ENCOUNTER — Other Ambulatory Visit: Payer: Self-pay | Admitting: Psychiatry

## 2022-06-26 DIAGNOSIS — F3181 Bipolar II disorder: Secondary | ICD-10-CM

## 2022-06-29 ENCOUNTER — Telehealth: Payer: Self-pay | Admitting: Psychiatry

## 2022-06-29 DIAGNOSIS — F900 Attention-deficit hyperactivity disorder, predominantly inattentive type: Secondary | ICD-10-CM

## 2022-06-29 MED ORDER — LISDEXAMFETAMINE DIMESYLATE 60 MG PO CAPS: 60.0000 mg | ORAL_CAPSULE | ORAL | 0 refills | Status: DC

## 2022-06-29 NOTE — Telephone Encounter (Signed)
Please let her know a script for Vyvanse 60 mg daily has been sent to her pharmacy.

## 2022-06-29 NOTE — Telephone Encounter (Signed)
Patient lvm stating she is out of her Vyvanse as of today. She is requesting a refill at [id number], [facility] - [address] [place], [place] [address] Phone: [phone]  Fax: [phone]    Appointment 08/29/22 Contact information # [phone]

## 2022-07-30 ENCOUNTER — Telehealth: Payer: Self-pay | Admitting: Psychiatry

## 2022-07-30 DIAGNOSIS — F900 Attention-deficit hyperactivity disorder, predominantly inattentive type: Secondary | ICD-10-CM

## 2022-07-30 NOTE — Telephone Encounter (Signed)
Pt lvm at 4:48p to request as many script as possible for the Vyvanse 60mg  to   [id number] - [address] [place], [place] [address] Phone: [phone]  Fax: [phone]   Next appt 6/19

## 2022-07-31 MED ORDER — LISDEXAMFETAMINE DIMESYLATE 60 MG PO CAPS: 60.0000 mg | ORAL_CAPSULE | ORAL | 0 refills | Status: DC

## 2022-07-31 NOTE — Telephone Encounter (Signed)
Please let her know a script has been sent.

## 2022-07-31 NOTE — Telephone Encounter (Signed)
Pt called again in case we did not get message from yesterday.

## 2022-08-11 ENCOUNTER — Other Ambulatory Visit: Payer: Self-pay | Admitting: Psychiatry

## 2022-08-11 DIAGNOSIS — F3181 Bipolar II disorder: Secondary | ICD-10-CM

## 2022-08-20 ENCOUNTER — Other Ambulatory Visit: Payer: Self-pay | Admitting: Psychiatry

## 2022-08-20 DIAGNOSIS — F3181 Bipolar II disorder: Secondary | ICD-10-CM

## 2022-08-29 ENCOUNTER — Telehealth: Payer: Self-pay | Admitting: Psychiatry

## 2022-08-29 ENCOUNTER — Encounter: Payer: Self-pay | Admitting: Psychiatry

## 2022-08-29 ENCOUNTER — Ambulatory Visit (INDEPENDENT_AMBULATORY_CARE_PROVIDER_SITE_OTHER): Payer: 59 | Admitting: Psychiatry

## 2022-08-29 VITALS — BP 120/61 | HR 78

## 2022-08-29 DIAGNOSIS — G2401 Drug induced subacute dyskinesia: Secondary | ICD-10-CM

## 2022-08-29 DIAGNOSIS — F900 Attention-deficit hyperactivity disorder, predominantly inattentive type: Secondary | ICD-10-CM | POA: Diagnosis not present

## 2022-08-29 DIAGNOSIS — Z79899 Other long term (current) drug therapy: Secondary | ICD-10-CM | POA: Diagnosis not present

## 2022-08-29 DIAGNOSIS — F3181 Bipolar II disorder: Secondary | ICD-10-CM | POA: Diagnosis not present

## 2022-08-29 DIAGNOSIS — F419 Anxiety disorder, unspecified: Secondary | ICD-10-CM | POA: Diagnosis not present

## 2022-08-29 MED ORDER — LAMOTRIGINE 200 MG PO TABS
200.0000 mg | ORAL_TABLET | Freq: Every day | ORAL | 0 refills | Status: DC
Start: 2022-08-29 — End: 2022-09-27

## 2022-08-29 MED ORDER — TRAZODONE HCL 50 MG PO TABS
ORAL_TABLET | ORAL | 0 refills | Status: DC
Start: 2022-08-29 — End: 2022-09-27

## 2022-08-29 MED ORDER — PROPRANOLOL HCL 10 MG PO TABS
ORAL_TABLET | ORAL | 1 refills | Status: DC
Start: 2022-08-29 — End: 2022-09-27

## 2022-08-29 MED ORDER — LISDEXAMFETAMINE DIMESYLATE 60 MG PO CHEW
60.0000 mg | CHEWABLE_TABLET | Freq: Every day | ORAL | 0 refills | Status: DC
Start: 2022-08-29 — End: 2022-09-27

## 2022-08-29 MED ORDER — CITALOPRAM HYDROBROMIDE 40 MG PO TABS
ORAL_TABLET | ORAL | 0 refills | Status: DC
Start: 2022-08-29 — End: 2022-09-27

## 2022-08-29 MED ORDER — LISDEXAMFETAMINE DIMESYLATE 60 MG PO CAPS
60.0000 mg | ORAL_CAPSULE | ORAL | 0 refills | Status: DC
Start: 2022-08-29 — End: 2023-03-21

## 2022-08-29 MED ORDER — LISDEXAMFETAMINE DIMESYLATE 60 MG PO CAPS
60.0000 mg | ORAL_CAPSULE | ORAL | 0 refills | Status: DC
Start: 2022-09-26 — End: 2023-01-10

## 2022-08-29 NOTE — Telephone Encounter (Signed)
Please let her know it was sent.

## 2022-08-29 NOTE — Telephone Encounter (Signed)
Patient lvm at 3:39 with questions about medication she was prescribed today. Ph: [phone]

## 2022-08-29 NOTE — Telephone Encounter (Signed)
Patient reports taking two doses of 2 tablets each today of propranolol and it did nothing for her movements. She said she was told it would take 15-20 minutes to kick in. She said she played a video while she was playing the piano and still had the movements, especially with her mouth. She is requesting a TD medication.  She struggled with being able to communicate this information to me (word finding or organizing thoughts ?).

## 2022-08-29 NOTE — Telephone Encounter (Signed)
Patient called in stating that pharmacy is out of Vyvanse 60mg . They do have the chewables in stock and she would like a [facility] [address] [place]. Ph: [phone]

## 2022-08-29 NOTE — Progress Notes (Signed)
[facility] [id number] Oct 15, 1965 57 y.o.  Subjective:   Patient ID:  Tabitha Bennett Teubert is a 57 y.o. (DOB [birth year]) female.  Chief Complaint:  Chief Complaint  Patient presents with   Anxiety   ADHD    HPI Tabitha Bennett presents to the office today for follow-up of mood disturbance, anxiety, ADHD, and insomnia. She reports, "I have not been doing well." She reports that her anxiety has been high. "I'm very fidgety." She reports that she had acute onset of itching yesterday that she thinks may be anxiety related. She reports, "I'm depressed a lot." She reports that she has had more difficulty with practicing piano- "I think I am having cognitive issues like my mom." She reports that on 2 occasions she was not able to think of the name of the road she was on. She reports that she has had significant distractibility. Frequently losing and misplacing things. "I'm not aware, I'm just floating around." Some word finding difficulties. Denies any change in irritability. "I've used poor judgment a couple of times." She reports that her energy is high. She reports sleeping 5 hours the last few nights. She reports that she continues to want to garden. "If I have a depression day, I just want to sit there on the couch all day." She reports having a depression day about 3 days a week. She reports that she has some mild depression every afternoon. Appetite "goes back and forth." Eats less when depressed. Denies SI.   She and her husband are often taking care of her grandson. He is with them 5 nights a week. Reports grandson "talks all the time."   She reports, "I'm not taking Xanax anymore because it doesn't work." She reports that she gradually reduced Xanax.   Reports that she was rarely taking Latuda and "now I've just stopped taking it."   She reports that movements are getting "worse" over time.   Past medication  trials: Celexa Lexapro Paxil Prozac Sertraline Effexor Pristiq Trintellix Wellbutrin Lamictal Trileptal Vraylar-weight gain Latuda- Reports side effects (akathisia, agitation) with 60 mg. Constipation at 60 mg.  Rexulti-tremor Caplyta Adderall XR Adderall- Agitation Vyvanse Mydayis Ritalin Xanax Trazodone Buspar  AIMS    Flowsheet Row Office Visit from 08/29/2022 in [facility]itha [facility] Office Visit from 05/29/2022 in Tabitha Bennett [facility] Office Visit from 02/16/2022 in Tabitha Bennett [facility] Office Visit from 11/22/2021 in Tabitha Bennett [facility] Office Visit from 04/04/2021 in Tabitha Bennett [facility]  AIMS Total Score 6 4 0 0 0        Review of Systems:  Review of Systems  Musculoskeletal:  Negative for gait problem.  Skin:        Recent itching  Neurological:  Negative for tremors.  Psychiatric/Behavioral:         Please refer to HPI    Medications: I have reviewed the patient's current medications.  Current Outpatient Medications  Medication Sig Dispense Refill   APPLE CIDER VINEGAR PO Take by mouth.     ascorbic acid (VITAMIN C) 500 MG tablet Take 500 mg by mouth daily.     Cholecalciferol 25 MCG (1000 UT) capsule Take by mouth.     estradiol (ESTRACE) 0.5 MG tablet Take by mouth.     [START ON 09/26/2022] lisdexamfetamine (VYVANSE) 60 MG capsule Take 1 capsule (60 mg total) by mouth every morning. 30 capsule 0   lumateperone tosylate (CAPLYTA) 42 MG capsule Take 1 capsule (  42 mg total) by mouth daily. 30 capsule 2   propranolol (INDERAL) 10 MG tablet Take 1-2 tabs po BID prn anxiety 120 tablet 1   ALPRAZolam (XANAX) 0.5 MG tablet Take 1 tablet (0.5 mg total) by mouth 3 (three) times daily as needed. (Patient not taking: Reported on 08/29/2022) 90 tablet 5   citalopram (CELEXA) 40 MG tablet Take 1/2-1 tab po qd 90 tablet 0   Deutetrabenazine (AUSTEDO XR PATIENT TITRATION)  6 & 12 & 24 MG TEPK Take 12 mg by mouth daily for 7 days, THEN 18 mg daily for 7 days, THEN 24 mg daily for 7 days, THEN 30 mg daily for 7 days. 1 each 0   lamoTRIgine (LAMICTAL) 200 MG tablet Take 1 tablet (200 mg total) by mouth daily. 90 tablet 0   lisdexamfetamine (VYVANSE) 60 MG capsule Take 1 capsule (60 mg total) by mouth every morning. 30 capsule 0   Lisdexamfetamine Dimesylate (VYVANSE) 60 MG CHEW Chew 1 tablet (60 mg total) by mouth daily. 30 tablet 0   Multiple Vitamin (MULTIVITAMIN) tablet Take 1 tablet by mouth daily. (Patient not taking: Reported on 08/29/2022)     traZODone (DESYREL) 50 MG tablet TAKE 1 TO 2 TABLETS BY MOUTH AT BEDTIME 180 tablet 0   tretinoin (RETIN-A) 0.1 % cream Apply topically daily. (Patient not taking: Reported on 08/29/2022)     No current facility-administered medications for this visit.    Medication Side Effects: Other: Jaw tightness, akathisia, involuntary movements.   Allergies:  Allergies  Allergen Reactions   Aripiprazole Other (See Comments)    Pt reports it makes her irritable.    Fluvoxamine Maleate Other (See Comments)    Pt reports it makes her irritable.     Paroxetine Hcl Other (See Comments)    Pt reports it makes her irritable.     Venlafaxine Other (See Comments)    Pt reports it makes her irritable.      Past Medical History:  Diagnosis Date   Bipolar 1 disorder [facility])     Past Medical History, Surgical history, Social history, and Family history were reviewed and updated as appropriate.   Please see review of systems for further details on the patient's review from today.   Objective:   Physical Exam:  BP 120/61   Pulse 78   Physical Exam Constitutional:      General: She is not in acute distress. Musculoskeletal:        General: No deformity.  Neurological:     Mental Status: She is alert and oriented to person, place, and time.     Coordination: Coordination normal.  Psychiatric:        Attention and  Perception: Attention and perception normal. She does not perceive auditory or visual hallucinations.        Mood and Affect: Mood is anxious. Mood is not depressed. Affect is not labile, blunt, angry or inappropriate.        Speech: Speech normal.        Behavior: Behavior normal.        Thought Content: Thought content normal. Thought content is not paranoid or delusional. Thought content does not include homicidal or suicidal ideation. Thought content does not include homicidal or suicidal plan.        Cognition and Memory: Cognition and memory normal.        Judgment: Judgment normal.     Comments: Insight intact     Lab Review:     Component  Value Date/Time   NA 138 02/13/2021 1122   K 4.5 02/13/2021 1122   CL 103 02/13/2021 1122   CO2 31 02/13/2021 1122   GLUCOSE 79 02/13/2021 1122   BUN 17 02/13/2021 1122   CREATININE 1.04 02/13/2021 1122   CALCIUM 9.1 02/13/2021 1122   PROT 6.0 11/25/2010 0942   ALBUMIN 3.0 (L) 11/25/2010 0942   AST 21 11/25/2010 0942   ALT 18 11/25/2010 0942   ALKPHOS 43 11/25/2010 0942   BILITOT 0.2 (L) 11/25/2010 0942   GFRNONAA >60 05/03/2016 1542   GFRAA >60 05/03/2016 1542       Component Value Date/Time   WBC 6.6 05/03/2016 1542   RBC 4.09 05/03/2016 1542   HGB 12.8 05/03/2016 1542   HCT 39.5 05/03/2016 1542   PLT 279 05/03/2016 1542   MCV 96.6 05/03/2016 1542   MCH 31.3 05/03/2016 1542   MCHC 32.4 05/03/2016 1542   RDW 12.3 05/03/2016 1542    Lithium Lvl  Date Value Ref Range Status  04/22/2016 <0.06 (L) 0.60 - 1.20 mmol/L Final     No results found for: "PHENYTOIN", "PHENOBARB", "VALPROATE", "CBMZ"   .res Assessment: Plan:   Discussed potential benefits, risks, and side effects of Propranolol. Discussed that Propranolol can be used as needed for severe restlessness (akathisia) and anxiety signs and symptoms. Patient agrees to trial of Propranolol. Will start Propranolol 10 mg 1-2 tabs po BID prn anxiety.  Discussed considering  treatment for TD if movements do not improve. Discussed that she may be experience a temporary increase in involuntary movements after stopping Latuda.  Pt requests labs to evaluate for a possible vitamin deficiency, to include Vitamin D and Vitamin B12. Will also order a lipid panel and hemoglobin A1C to assess for possible adverse effects with atypical antipsychotics.  Continue Caplyta 42 mg daily for mood disorder.  Continue Vyvanse 60 mg daily for ADHD .  Continue Lamictal 200 mg daily for mood stabilization.  Continue Trazodone 50 mg 1-2 tabs po at bedtime for insomnia.  Discussed potential benefits, risks, and side effects of Propranolol. Discussed that Propranolol can be used as needed for anxiety signs and symptoms. Patient agrees to trial of Propranolol. Will start Propranolol 10 mg 1-2 tabs po BID prn anxiety.  Continue Citalopram  40 mg 1/2-1 tab po every day for depression and anxiety.  Will discontinue Alprazolam since pt reports that this is no longer helpful for her.  Pt to follow-up with this provider in 4 weeks or sooner if clinically indicated.  Patient advised to contact office with any questions, adverse effects, or acute worsening in signs and symptoms.    Tabitha Bennett was seen today for anxiety and adhd.  Diagnoses and all orders for this visit:  Bipolar II disorder (HCC) -     lamoTRIgine (LAMICTAL) 200 MG tablet; Take 1 tablet (200 mg total) by mouth daily. -     citalopram (CELEXA) 40 MG tablet; Take 1/2-1 tab po qd -     traZODone (DESYREL) 50 MG tablet; TAKE 1 TO 2 TABLETS BY MOUTH AT BEDTIME  High risk medication use -     VITAMIN D 25 Hydroxy (Vit-D Deficiency, Fractures) -     Vitamin B12 -     Lipid panel -     Hemoglobin A1c  Anxiety disorder, unspecified type -     propranolol (INDERAL) 10 MG tablet; Take 1-2 tabs po BID prn anxiety -     citalopram (CELEXA) 40 MG tablet; Take 1/2-1 tab  po qd  ADHD, predominantly inattentive type -     lisdexamfetamine  (VYVANSE) 60 MG capsule; Take 1 capsule (60 mg total) by mouth every morning. -     lisdexamfetamine (VYVANSE) 60 MG capsule; Take 1 capsule (60 mg total) by mouth every morning.  Tardive dyskinesia     Please see After Visit Summary for patient specific instructions.  Future Appointments  Date Time Provider Department [facility]  09/27/2022 11:00 AM Corie Chiquito, PMHNP [facility] None    Orders Placed This Encounter  Procedures   VITAMIN D 25 Hydroxy (Vit-D Deficiency, Fractures)   Vitamin B12   Lipid panel   Hemoglobin A1c    -------------------------------

## 2022-08-30 MED ORDER — AUSTEDO XR PATIENT TITRATION 6 & 12 & 24 MG PO TEPK
EXTENDED_RELEASE_TABLET | ORAL | 0 refills | Status: AC
Start: 2022-08-30 — End: 2022-09-26

## 2022-08-30 NOTE — Telephone Encounter (Signed)
Please let her know that Propranolol typically helps more with restlessness instead of TD. Did she feel any less restless or anxious after taking Propranolol? Please let her know a sample titration kit for Austedo XR for TD has been pulled for her with instructions on how to titrate dose each week over the next 4 weeks.

## 2022-08-31 NOTE — Telephone Encounter (Signed)
No decrease in restlessness or anxiety. Says she is frustrated. She will get Austedo samples.

## 2022-09-04 ENCOUNTER — Telehealth: Payer: Self-pay | Admitting: Psychiatry

## 2022-09-04 NOTE — Telephone Encounter (Signed)
Patient notified

## 2022-09-04 NOTE — Telephone Encounter (Signed)
-----   Message from Franchot Erichsen, [place] sent at 09/04/2022  8:48 AM EDT ----- Patient reports propranolol not helping her, but after discussion she was only taking 1 tablet BID. She will bump up to 20 BID and is asking if she can even go up to 30 mg.   Austedo made her depressed and she can't take it. She stopped the Caplyta 2 days ago and restarted Latuda 60 mg 2 days ago. She said she read that the Latuda will cause less movement disorders than Caplyta.  ----- Message ----- From: Corie Chiquito, PMHNP Sent: 09/03/2022  12:00 AM EDT To: Franchot Erichsen, CMA  Please call patient and ask how she is feeling with Propranolol.

## 2022-09-04 NOTE — Telephone Encounter (Signed)
Recommend trying Propranolol 20 mg BID to determine if this is helpful for anxiety and/or the restlessness that she has been experiencing. Would not recommend increasing to 30 mg BID at this time since higher dose may cause dizziness/light-headedness. Recommend not making any additional changes at this time since she switched Caplyta to [place] and had brief trial of Austedo.

## 2022-09-20 ENCOUNTER — Other Ambulatory Visit: Payer: Self-pay | Admitting: Psychiatry

## 2022-09-20 DIAGNOSIS — F419 Anxiety disorder, unspecified: Secondary | ICD-10-CM

## 2022-09-21 NOTE — Telephone Encounter (Signed)
Is she still taking?

## 2022-09-24 NOTE — Telephone Encounter (Signed)
Has appt 7/18. LVM to [facility].

## 2022-09-27 ENCOUNTER — Ambulatory Visit (INDEPENDENT_AMBULATORY_CARE_PROVIDER_SITE_OTHER): Payer: 59 | Admitting: Psychiatry

## 2022-09-27 ENCOUNTER — Encounter: Payer: Self-pay | Admitting: Psychiatry

## 2022-09-27 VITALS — BP 87/64 | HR 86

## 2022-09-27 DIAGNOSIS — F3181 Bipolar II disorder: Secondary | ICD-10-CM | POA: Diagnosis not present

## 2022-09-27 DIAGNOSIS — G2401 Drug induced subacute dyskinesia: Secondary | ICD-10-CM | POA: Diagnosis not present

## 2022-09-27 DIAGNOSIS — F900 Attention-deficit hyperactivity disorder, predominantly inattentive type: Secondary | ICD-10-CM | POA: Diagnosis not present

## 2022-09-27 DIAGNOSIS — F419 Anxiety disorder, unspecified: Secondary | ICD-10-CM

## 2022-09-27 MED ORDER — LISDEXAMFETAMINE DIMESYLATE 60 MG PO CHEW
60.0000 mg | CHEWABLE_TABLET | Freq: Every day | ORAL | 0 refills | Status: DC
Start: 2022-09-27 — End: 2022-11-27

## 2022-09-27 MED ORDER — LAMOTRIGINE 200 MG PO TABS
200.0000 mg | ORAL_TABLET | Freq: Every day | ORAL | 0 refills | Status: DC
Start: 2022-09-27 — End: 2023-01-10

## 2022-09-27 MED ORDER — VALBENAZINE TOSYLATE 40 MG PO CAPS
40.0000 mg | ORAL_CAPSULE | Freq: Every day | ORAL | Status: DC
Start: 2022-09-27 — End: 2022-10-16

## 2022-09-27 MED ORDER — VALBENAZINE TOSYLATE 60 MG PO CAPS
60.0000 mg | ORAL_CAPSULE | Freq: Every day | ORAL | Status: DC
Start: 2022-09-27 — End: 2022-10-16

## 2022-09-27 MED ORDER — CITALOPRAM HYDROBROMIDE 40 MG PO TABS
ORAL_TABLET | ORAL | 0 refills | Status: DC
Start: 2022-09-27 — End: 2023-01-10

## 2022-09-27 MED ORDER — TRAZODONE HCL 50 MG PO TABS
ORAL_TABLET | ORAL | 0 refills | Status: DC
Start: 2022-09-27 — End: 2023-01-10

## 2022-09-27 MED ORDER — LURASIDONE HCL 60 MG PO TABS
60.0000 mg | ORAL_TABLET | Freq: Every evening | ORAL | 1 refills | Status: DC
Start: 2022-09-27 — End: 2023-01-10

## 2022-09-27 NOTE — Progress Notes (Signed)
[facility] [place] [id number] 11/10/1965 57 y.o.  Subjective:   Patient ID:  [facility] Brian is a 57 y.o. (DOB 03/07/1966) female.  Chief Complaint:  Chief Complaint  Patient presents with   Medication Problem    TD   Follow-up    Mood disturbance, anxiety, ADHD    HPI [facility] presents to the office today for follow-up of mood disturbance, anxiety, ADHD, and insomnia. She reports, "I'm doing better, as far as my mood, on Jordan." She reports that she is taking Web designer and this seems to be preventing GI side effects that she experienced previously. She reports, "I'm constantly putting pressure on myself" to take care of grandson.  She reports that she is not having panic regularly. She reports sleeping less now in general. Energy and motivation have been ok. She reports that her appetite has been ok. She reports poor concentration. Denies SI.   She notices less orofacial movements. She reports involuntary movements have improved some.   She reports, "instant, deep depression" after taking Austedo XR.  She reports that she has not had an opportunity to have labs drawn yet.   Vyvanse last filled 08/29/22.  Past medication trials: Celexa Lexapro Paxil Prozac Sertraline Effexor Pristiq Trintellix Wellbutrin Lamictal Trileptal Vraylar-weight gain Latuda- Reports side effects (akathisia, agitation) with 60 mg. Constipation at 60 mg.  Rexulti-tremor Caplyta Adderall XR Adderall- Agitation Vyvanse Mydayis Ritalin Xanax Trazodone Buspar   AIMS    Flowsheet Row Office Visit from 09/27/2022 in [facility] Office Visit from 08/29/2022 in [facility] Office Visit from 05/29/2022 in [facility] Office Visit from 02/16/2022 in [facility] Office Visit from 11/22/2021 in [facility]  AIMS Total Score 9  6 4  0 0        Review of Systems:  Review of Systems  Gastrointestinal:  Negative for constipation.  Musculoskeletal:  Negative for gait problem.  Psychiatric/Behavioral:         Please refer to HPI    Medications: I have reviewed the patient's current medications.  Current Outpatient Medications  Medication Sig Dispense Refill   APPLE CIDER VINEGAR PO Take by mouth.     ascorbic acid (VITAMIN C) 500 MG tablet Take 500 mg by mouth daily.     valbenazine (INGREZZA) 40 MG capsule Take 1 capsule (40 mg total) by mouth daily.     valbenazine (INGREZZA) 60 MG capsule Take 1 capsule (60 mg total) by mouth at bedtime. Start after taking 40 mg capsule at bedtime for one week 7 capsule    Cholecalciferol 25 MCG (1000 UT) capsule Take by mouth.     citalopram (CELEXA) 40 MG tablet Take 1/2-1 tab po qd 90 tablet 0   estradiol (ESTRACE) 0.5 MG tablet Take by mouth.     lamoTRIgine (LAMICTAL) 200 MG tablet Take 1 tablet (200 mg total) by mouth daily. 90 tablet 0   lisdexamfetamine (VYVANSE) 60 MG capsule Take 1 capsule (60 mg total) by mouth every morning. 30 capsule 0   lisdexamfetamine (VYVANSE) 60 MG capsule Take 1 capsule (60 mg total) by mouth every morning. 30 capsule 0   Lisdexamfetamine Dimesylate (VYVANSE) 60 MG CHEW Chew 1 tablet (60 mg total) by mouth daily. 30 tablet 0   Lurasidone HCl 60 MG TABS Take 1 tablet (60 mg total) by mouth every evening. 90 tablet 1   Multiple Vitamin (MULTIVITAMIN) tablet Take 1 tablet  by mouth daily. (Patient not taking: Reported on 08/29/2022)     propranolol (INDERAL) 10 MG tablet Take 1-2 tabs po BID prn anxiety 120 tablet 1   traZODone (DESYREL) 50 MG tablet TAKE 1 TO 2 TABLETS BY MOUTH AT BEDTIME 180 tablet 0   tretinoin (RETIN-A) 0.1 % cream Apply topically daily. (Patient not taking: Reported on 08/29/2022)     No current facility-administered medications for this visit.    Medication Side Effects: None  Allergies:  Allergies  Allergen  Reactions   Aripiprazole Other (See Comments)    Pt reports it makes her irritable.    Fluvoxamine Maleate Other (See Comments)    Pt reports it makes her irritable.     Paroxetine Hcl Other (See Comments)    Pt reports it makes her irritable.     Venlafaxine Other (See Comments)    Pt reports it makes her irritable.      Past Medical History:  Diagnosis Date   Bipolar 1 disorder [facility])     Past Medical History, Surgical history, Social history, and Family history were reviewed and updated as appropriate.   Please see review of systems for further details on the patient's review from today.   Objective:   Physical Exam:  BP (!) 87/64   Pulse 86   Physical Exam Constitutional:      General: She is not in acute distress. Musculoskeletal:        General: No deformity.  Neurological:     Mental Status: She is alert and oriented to person, place, and time.     Coordination: Coordination normal.  Psychiatric:        Attention and Perception: Attention and perception normal. She does not perceive auditory or visual hallucinations.        Mood and Affect: Mood is anxious. Mood is not depressed. Affect is not labile, blunt, angry or inappropriate.        Speech: Speech normal.        Behavior: Behavior normal.        Thought Content: Thought content normal. Thought content is not paranoid or delusional. Thought content does not include homicidal or suicidal ideation. Thought content does not include homicidal or suicidal plan.        Cognition and Memory: Cognition and memory normal.        Judgment: Judgment normal.     Comments: Insight intact Mood presents as less anxious compared to last exam     Lab Review:     Component Value Date/Time   NA 138 02/13/2021 1122   K 4.5 02/13/2021 1122   CL 103 02/13/2021 1122   CO2 31 02/13/2021 1122   GLUCOSE 79 02/13/2021 1122   BUN 17 02/13/2021 1122   CREATININE 1.04 02/13/2021 1122   CALCIUM 9.1 02/13/2021 1122   PROT 6.0  11/25/2010 0942   ALBUMIN 3.0 (L) 11/25/2010 0942   AST 21 11/25/2010 0942   ALT 18 11/25/2010 0942   ALKPHOS 43 11/25/2010 0942   BILITOT 0.2 (L) 11/25/2010 0942   GFRNONAA >60 05/03/2016 1542   GFRAA >60 05/03/2016 1542       Component Value Date/Time   WBC 6.6 05/03/2016 1542   RBC 4.09 05/03/2016 1542   HGB 12.8 05/03/2016 1542   HCT 39.5 05/03/2016 1542   PLT 279 05/03/2016 1542   MCV 96.6 05/03/2016 1542   MCH 31.3 05/03/2016 1542   MCHC 32.4 05/03/2016 1542   RDW 12.3 05/03/2016 1542  Lithium Lvl  Date Value Ref Range Status  04/22/2016 <0.06 (L) 0.60 - 1.20 mmol/L Final     No results found for: "PHENYTOIN", "PHENOBARB", "VALPROATE", "CBMZ"   .res Assessment: Plan:    Pt reports that she would like to continue current medications without changes since her mood has significantly improved with stopping Caplyta and resuming Latuda. She reports some slight improvement in tardive dyskinesia. She reports that TD continues to cause her significant distress and asks about another treatment option for TD. Discussed potential benefits, risks, and side effects of Ingrezza. Discussed that Allena Earing has a similar mechanism of action compared to Austedo XR, and she may therefore have similar adverse effect to [place] that she experienced with Austedo XR. Pt reports some apprehension about trial of Ingrezza. She requests samples of Ingrezza and reports that she may wait for an opportune time to start trial of Ingrezza. Pt provided with samples of Ingrezza 40 mg at bedtime for one week and then samples to increase to 60 mg at bedtime as tolerated. Advised pt to contact office for script if she starts Ingrezza and is able to tolerate it.  Will continue Latuda 60 mg daily for mood symptoms.  Will continue Citalopram 40 mg 1/2-1 tablet daily for anxiety and mood symptoms.  Will continue Lamictal 200 mg daily for mood symptoms.  Will continue Vyvanse 60 mg daily for ADHD.  Continue  Propranolol 10 mg 1-2 tabs po BID prn anxiety.  Continue Trazodone 50 mg 1-2 tabs po at bedtime for insomnia.  Pt to follow-up in 3 months or sooner if clinically indicated.  Patient advised to contact office with any questions, adverse effects, or acute worsening in signs and symptoms.   Estoria was seen today for medication problem and follow-up.  Diagnoses and all orders for this visit:  Anxiety disorder, unspecified type -     citalopram (CELEXA) 40 MG tablet; Take 1/2-1 tab po qd  ADHD, predominantly inattentive type -     Lisdexamfetamine Dimesylate (VYVANSE) 60 MG CHEW; Chew 1 tablet (60 mg total) by mouth daily.  Tardive dyskinesia -     valbenazine (INGREZZA) 40 MG capsule; Take 1 capsule (40 mg total) by mouth daily. -     valbenazine (INGREZZA) 60 MG capsule; Take 1 capsule (60 mg total) by mouth at bedtime. Start after taking 40 mg capsule at bedtime for one week  Bipolar II disorder (HCC) -     citalopram (CELEXA) 40 MG tablet; Take 1/2-1 tab po qd -     lamoTRIgine (LAMICTAL) 200 MG tablet; Take 1 tablet (200 mg total) by mouth daily. -     traZODone (DESYREL) 50 MG tablet; TAKE 1 TO 2 TABLETS BY MOUTH AT BEDTIME -     Lurasidone HCl 60 MG TABS; Take 1 tablet (60 mg total) by mouth every evening.     Please see After Visit Summary for patient specific instructions.  Future Appointments  Date Time Provider Department Center  12/26/2022 10:00 AM Corie Chiquito, PMHNP CP-CP None    No orders of the defined types were placed in this encounter.   -------------------------------

## 2022-10-09 DIAGNOSIS — F341 Dysthymic disorder: Secondary | ICD-10-CM

## 2022-10-15 ENCOUNTER — Telehealth: Payer: Self-pay | Admitting: Psychiatry

## 2022-10-15 DIAGNOSIS — F419 Anxiety disorder, unspecified: Secondary | ICD-10-CM

## 2022-10-15 NOTE — Telephone Encounter (Signed)
Did she try Ingrezza?

## 2022-10-15 NOTE — Telephone Encounter (Signed)
Pt LVM @ 1:36p requesting to start Gabapentin for her nerves and tardive dyskinesia.  Called pt back to get name of pharmacy if Shanda Bumps will send the medication in, but had to LVM.  Next appt 10/16

## 2022-10-16 ENCOUNTER — Telehealth: Payer: Self-pay | Admitting: Psychiatry

## 2022-10-16 MED ORDER — GABAPENTIN 300 MG PO CAPS: 300.0000 mg | ORAL_CAPSULE | Freq: Two times a day (BID) | ORAL | 1 refills | Status: DC

## 2022-10-16 NOTE — Telephone Encounter (Signed)
I had tried to call patient twice and she called back twice, but we kept missing each other. She came into the office today to ask for gabapentin. She was prescribed Ingrezza but she said she won't take it because she has read too much bad stuff about it. Instead she is asking for gabapentin 300 mg, didn't specify frequency. She said she had taken it before. I did not see in PMPD and I don't see in Epic med history. She reports it calms her down.

## 2022-10-16 NOTE — Telephone Encounter (Signed)
Please let her know a script has been sent for Gabapentin.

## 2022-10-16 NOTE — Telephone Encounter (Signed)
Patient notified

## 2022-10-16 NOTE — Telephone Encounter (Signed)
Patient called back earlier today but I was not available. Left another VM.

## 2022-10-16 NOTE — Telephone Encounter (Signed)
LVM to RC 

## 2022-10-24 NOTE — Telephone Encounter (Signed)
Error

## 2022-11-23 ENCOUNTER — Other Ambulatory Visit: Payer: Self-pay | Admitting: Family Medicine

## 2022-11-23 ENCOUNTER — Ambulatory Visit
Admission: RE | Admit: 2022-11-23 | Discharge: 2022-11-23 | Disposition: A | Discharge status: Home / Self Care | Payer: 59 | Source: Ambulatory Visit | Attending: Family Medicine | Admitting: Family Medicine

## 2022-11-23 DIAGNOSIS — R1030 Lower abdominal pain, unspecified: Secondary | ICD-10-CM

## 2022-11-23 MED ORDER — IOPAMIDOL (ISOVUE-300) INJECTION 61%
500.0000 mL | Freq: Once | INTRAVENOUS | Status: AC | PRN
  Administered 2022-11-23: 100 mL via INTRAVENOUS

## 2022-11-27 ENCOUNTER — Other Ambulatory Visit: Payer: Self-pay

## 2022-11-27 ENCOUNTER — Telehealth: Payer: Self-pay | Admitting: Psychiatry

## 2022-11-27 DIAGNOSIS — F900 Attention-deficit hyperactivity disorder, predominantly inattentive type: Secondary | ICD-10-CM

## 2022-11-27 NOTE — Telephone Encounter (Signed)
Patient lvm requesting a refill on the Vyvanse 60 mg chew. Fill at [id number] Otto, [facility] - [address] [place], [place] [address] Phone: [phone]  Fax: [phone]   Appointment 12/26/22

## 2022-11-27 NOTE — Telephone Encounter (Signed)
Pended.

## 2022-11-28 MED ORDER — LISDEXAMFETAMINE DIMESYLATE 60 MG PO CHEW
60.0000 mg | CHEWABLE_TABLET | Freq: Every day | ORAL | 0 refills | Status: DC
Start: 2022-11-28 — End: 2022-12-28

## 2022-12-18 ENCOUNTER — Other Ambulatory Visit: Payer: Self-pay | Admitting: Psychiatry

## 2022-12-18 DIAGNOSIS — F419 Anxiety disorder, unspecified: Secondary | ICD-10-CM

## 2022-12-26 ENCOUNTER — Ambulatory Visit: Payer: 59 | Admitting: Psychiatry

## 2022-12-28 ENCOUNTER — Other Ambulatory Visit: Payer: Self-pay

## 2022-12-28 ENCOUNTER — Telehealth: Payer: Self-pay | Admitting: Psychiatry

## 2022-12-28 DIAGNOSIS — F900 Attention-deficit hyperactivity disorder, predominantly inattentive type: Secondary | ICD-10-CM

## 2022-12-28 NOTE — Telephone Encounter (Signed)
Pended.

## 2022-12-28 NOTE — Telephone Encounter (Signed)
Pt called at 4:48p requesting refill of Vyvanse  60mg  chewable to   [id number] [address] [facility] - [address] [place], [place] [address] Phone: [phone]  Fax: [phone]   Next appt 10/31

## 2022-12-31 MED ORDER — LISDEXAMFETAMINE DIMESYLATE 60 MG PO CHEW
60.0000 mg | CHEWABLE_TABLET | Freq: Every day | ORAL | 0 refills | Status: DC
Start: 2022-12-31 — End: 2023-01-10

## 2023-01-04 ENCOUNTER — Ambulatory Visit (INDEPENDENT_AMBULATORY_CARE_PROVIDER_SITE_OTHER): Payer: Self-pay | Admitting: Plastic Surgery

## 2023-01-04 ENCOUNTER — Encounter: Payer: Self-pay | Admitting: Plastic Surgery

## 2023-01-04 DIAGNOSIS — Z719 Counseling, unspecified: Secondary | ICD-10-CM

## 2023-01-04 NOTE — Progress Notes (Signed)
Filler Injection Procedure Note  Procedure:  Filler administration  Pre-operative Diagnosis: Rytides   Post-operative Diagnosis: Same  Surgeon: Electronically signed by: Wayland Denis, DO   Complications:  None  Brief history: The patient desires injection with fillers in her face. I discussed with the patient this proposed procedure of filler injections, which is customized depending on the particular needs of the patient. It is performed on facial volume loss as a temporary correction. The alternatives were discussed with the patient. The risks were addressed including bleeding, scarring, infection, damage to deeper structures, asymmetry, and chronic pain, which may occur infrequently after a procedure. The individual's choice to undergo a surgical procedure is based on the comparison of risks to potential benefits. Other risks include unsatisfactory results, allergic reaction, which should go away with time, bruising and delayed healing. Fillers do not arrest the aging process or produce permanent tightening.  Operative intervention maybe necessary to maintain the results. The patient understands and wishes to proceed. An informed consent was signed and informational brochures given to her prior to the procedure.  Procedure: The area was prepped with alcohol and dried with a clean gauze. Using a clean technique, a 30 gauge needle was then used to inject the filler into the upper lip and perioral area. This was done with one syringe.   Patient wanted filler in her chin but I do not know what filler she had done 6 months ago.  I do not want to add to it without knowing what is in there.  The patient describes something from [place].  Will try and get the records to find out what she had injected.  No complications were noted. Light pressure was held for 5 minutes. She was instructed explicitly in post-operative care.   Kysse LOT: [id number]

## 2023-01-10 ENCOUNTER — Ambulatory Visit (INDEPENDENT_AMBULATORY_CARE_PROVIDER_SITE_OTHER): Payer: 59 | Admitting: Psychiatry

## 2023-01-10 ENCOUNTER — Encounter: Payer: Self-pay | Admitting: Psychiatry

## 2023-01-10 DIAGNOSIS — F419 Anxiety disorder, unspecified: Secondary | ICD-10-CM

## 2023-01-10 DIAGNOSIS — F3181 Bipolar II disorder: Secondary | ICD-10-CM

## 2023-01-10 DIAGNOSIS — F900 Attention-deficit hyperactivity disorder, predominantly inattentive type: Secondary | ICD-10-CM

## 2023-01-10 MED ORDER — LAMOTRIGINE 200 MG PO TABS
200.0000 mg | ORAL_TABLET | Freq: Every day | ORAL | 0 refills | Status: DC
Start: 2023-01-10 — End: 2023-03-20

## 2023-01-10 MED ORDER — GABAPENTIN 300 MG PO CAPS
300.0000 mg | ORAL_CAPSULE | Freq: Three times a day (TID) | ORAL | 3 refills | Status: DC
Start: 2023-01-10 — End: 2023-03-20

## 2023-01-10 MED ORDER — LISDEXAMFETAMINE DIMESYLATE 60 MG PO CAPS: 60.0000 mg | ORAL_CAPSULE | ORAL | 0 refills | Status: AC

## 2023-01-10 MED ORDER — LISDEXAMFETAMINE DIMESYLATE 60 MG PO CHEW: 60.0000 mg | CHEWABLE_TABLET | Freq: Every day | ORAL | 0 refills | Status: DC

## 2023-01-10 MED ORDER — CITALOPRAM HYDROBROMIDE 40 MG PO TABS
ORAL_TABLET | ORAL | 0 refills | Status: DC
Start: 2023-01-10 — End: 2023-03-20

## 2023-01-10 MED ORDER — LURASIDONE HCL 60 MG PO TABS
60.0000 mg | ORAL_TABLET | Freq: Every evening | ORAL | 1 refills | Status: DC
Start: 2023-01-10 — End: 2023-03-20

## 2023-01-10 MED ORDER — TRAZODONE HCL 50 MG PO TABS
ORAL_TABLET | ORAL | 0 refills | Status: DC
Start: 2023-01-10 — End: 2023-03-20

## 2023-01-10 NOTE — Progress Notes (Signed)
[facility] [place] [id number] 1965/05/05 57 y.o.  Subjective:   Patient ID:  [facility] Raffel is a 57 y.o. (DOB 08/15/1965) female.  Chief Complaint:  Chief Complaint  Patient presents with   Follow-up    Anxiety, mood disturbance, ADHD, and insomnia    HPI [facility] presents to the office today for follow-up of anxiety, mood  She reports "I''m doing much better." She reports that she is moving her mouth less. She reports that she rarely has irritability. Anxiety is less. Denies any recent panic attacks. Concentration is fair. Reports that she needs to write things down. Difficulty keeping up with appointments. Reports feeling very distracted and not completing tasks.   Denies depressed mood. Improved anxiety. Sleeping well. Appetite has been increased. She reports, "I have been a little bit manic, but it hasn't changed too much of my behavior." She reports that when she is depressed she does not spend money and is spending more. Denies SI.   Vyvanse last filled 12/31/22. Gabapentin 300 mg last filled 12/18/22   Past medication trials: Celexa Lexapro Paxil Prozac Sertraline Effexor Pristiq Trintellix Wellbutrin Lamictal Trileptal Vraylar-weight gain Latuda- Reports side effects (akathisia, agitation) with 60 mg. Constipation at 60 mg.  Rexulti-tremor Caplyta Adderall XR Adderall- Agitation Vyvanse Mydayis Ritalin Xanax Trazodone Buspar    AIMS    Flowsheet Row Office Visit from 01/10/2023 in [facility] Office Visit from 09/27/2022 in [facility] Office Visit from 08/29/2022 in [facility] Office Visit from 05/29/2022 in [facility] Office Visit from 02/16/2022 in [facility]  AIMS Total Score 2 9 6 4  0        Review of Systems:  Review of Systems  Gastrointestinal:  Positive for constipation.   Musculoskeletal:  Negative for gait problem.  Psychiatric/Behavioral:         Please refer to HPI    Medications: I have reviewed the patient's current medications.  Current Outpatient Medications  Medication Sig Dispense Refill   ascorbic acid (VITAMIN C) 500 MG tablet Take 500 mg by mouth daily.     estradiol (ESTRACE) 0.5 MG tablet Take by mouth.     lisdexamfetamine (VYVANSE) 60 MG capsule Take 1 capsule (60 mg total) by mouth every morning. 30 capsule 0   [START ON 01/28/2023] Lisdexamfetamine Dimesylate (VYVANSE) 60 MG CHEW Chew 1 tablet (60 mg total) by mouth daily. 30 tablet 0   Multiple Vitamin (MULTIVITAMIN) tablet Take 1 tablet by mouth daily.     citalopram (CELEXA) 40 MG tablet Take 1/2-1 tab po qd 90 tablet 0   gabapentin (NEURONTIN) 300 MG capsule Take 1 capsule (300 mg total) by mouth 3 (three) times daily. 90 capsule 3   lamoTRIgine (LAMICTAL) 200 MG tablet Take 1 tablet (200 mg total) by mouth daily. 90 tablet 0   [START ON 03/25/2023] lisdexamfetamine (VYVANSE) 60 MG capsule Take 1 capsule (60 mg total) by mouth every morning. 30 capsule 0   [START ON 02/25/2023] Lisdexamfetamine Dimesylate (VYVANSE) 60 MG CHEW Chew 1 tablet (60 mg total) by mouth daily. 30 tablet 0   Lurasidone HCl 60 MG TABS Take 1 tablet (60 mg total) by mouth every evening. 90 tablet 1   traZODone (DESYREL) 50 MG tablet TAKE 1 TO 2 TABLETS BY MOUTH AT BEDTIME 180 tablet 0   No current facility-administered medications for this visit.    Medication Side Effects: Other: Constipation  Allergies:  Allergies  Allergen Reactions   Aripiprazole Other (See Comments)    Pt reports it makes her irritable.    Fluvoxamine Maleate Other (See Comments)    Pt reports it makes her irritable.     Paroxetine Hcl Other (See Comments)    Pt reports it makes her irritable.     Venlafaxine Other (See Comments)    Pt reports it makes her irritable.      Past Medical History:  Diagnosis Date   Bipolar 1  disorder [facility])     Past Medical History, Surgical history, Social history, and Family history were reviewed and updated as appropriate.   Please see review of systems for further details on the patient's review from today.   Objective:   Physical Exam:  BP 92/69   Pulse 83   Physical Exam Constitutional:      General: She is not in acute distress. Musculoskeletal:        General: No deformity.  Neurological:     Mental Status: She is alert and oriented to person, place, and time.     Coordination: Coordination normal.  Psychiatric:        Attention and Perception: Attention and perception normal. She does not perceive auditory or visual hallucinations.        Mood and Affect: Mood normal. Mood is not anxious or depressed. Affect is not labile, blunt, angry or inappropriate.        Speech: Speech normal.        Behavior: Behavior normal.        Thought Content: Thought content normal. Thought content is not paranoid or delusional. Thought content does not include homicidal or suicidal ideation. Thought content does not include homicidal or suicidal plan.        Cognition and Memory: Cognition and memory normal.        Judgment: Judgment normal.     Comments: Insight intact    Lab Review:     Component Value Date/Time   NA 138 02/13/2021 1122   K 4.5 02/13/2021 1122   CL 103 02/13/2021 1122   CO2 31 02/13/2021 1122   GLUCOSE 79 02/13/2021 1122   BUN 17 02/13/2021 1122   CREATININE 1.04 02/13/2021 1122   CALCIUM 9.1 02/13/2021 1122   PROT 6.0 11/25/2010 0942   ALBUMIN 3.0 (L) 11/25/2010 0942   AST 21 11/25/2010 0942   ALT 18 11/25/2010 0942   ALKPHOS 43 11/25/2010 0942   BILITOT 0.2 (L) 11/25/2010 0942   GFRNONAA >60 05/03/2016 1542   GFRAA >60 05/03/2016 1542       Component Value Date/Time   WBC 6.6 05/03/2016 1542   RBC 4.09 05/03/2016 1542   HGB 12.8 05/03/2016 1542   HCT 39.5 05/03/2016 1542   PLT 279 05/03/2016 1542   MCV 96.6 05/03/2016 1542   MCH 31.3  05/03/2016 1542   MCHC 32.4 05/03/2016 1542   RDW 12.3 05/03/2016 1542    Lithium Lvl  Date Value Ref Range Status  04/22/2016 <0.06 (L) 0.60 - 1.20 mmol/L Final     No results found for: "PHENYTOIN", "PHENOBARB", "VALPROATE", "CBMZ"   .res Assessment: Plan:   Will increase Gabapentin to 300 mg three times daily for anxiety since pt reports that she occasionally notices some break through anxiety between doses.  Continue Celexa 40 mg 1/2-1 tab daily for depression and anxiety.  Continue Lamictal 200 mg daily for mood symptoms.  Continue Vyvanse 60 mg daily for ADHD. She requests chewable through  the end of the year and resuming capsules in the new year due to insurance reasons.  Continue Latuda 60 mg daily for mood symptoms.  Continue Trazodone 50 mg 1-2 tabs at bedtime for insomnia.  Pt to follow-up in 2 months or sooner if clinically indicated.  Patient advised to contact office with any questions, adverse effects, or acute worsening in signs and symptoms.   Indi was seen today for follow-up.  Diagnoses and all orders for this visit:  Anxiety disorder, unspecified type -     citalopram (CELEXA) 40 MG tablet; Take 1/2-1 tab po qd -     gabapentin (NEURONTIN) 300 MG capsule; Take 1 capsule (300 mg total) by mouth 3 (three) times daily.  Bipolar II disorder (HCC) -     citalopram (CELEXA) 40 MG tablet; Take 1/2-1 tab po qd -     lamoTRIgine (LAMICTAL) 200 MG tablet; Take 1 tablet (200 mg total) by mouth daily. -     Lurasidone HCl 60 MG TABS; Take 1 tablet (60 mg total) by mouth every evening. -     traZODone (DESYREL) 50 MG tablet; TAKE 1 TO 2 TABLETS BY MOUTH AT BEDTIME  ADHD, predominantly inattentive type -     Lisdexamfetamine Dimesylate (VYVANSE) 60 MG CHEW; Chew 1 tablet (60 mg total) by mouth daily. -     Lisdexamfetamine Dimesylate (VYVANSE) 60 MG CHEW; Chew 1 tablet (60 mg total) by mouth daily. -     lisdexamfetamine (VYVANSE) 60 MG capsule; Take 1 capsule (60  mg total) by mouth every morning.     Please see After Visit Summary for patient specific instructions.  Future Appointments  Date Time Provider Department Center  03/21/2023  1:00 PM Corie Chiquito, PMHNP CP-CP None    No orders of the defined types were placed in this encounter.   -------------------------------

## 2023-01-15 ENCOUNTER — Other Ambulatory Visit: Payer: Self-pay | Admitting: Psychiatry

## 2023-01-15 DIAGNOSIS — F419 Anxiety disorder, unspecified: Secondary | ICD-10-CM

## 2023-01-23 ENCOUNTER — Encounter: Payer: Self-pay | Admitting: Psychiatry

## 2023-03-04 ENCOUNTER — Telehealth: Payer: Self-pay | Admitting: Plastic Surgery

## 2023-03-04 ENCOUNTER — Encounter: Payer: 59 | Admitting: Plastic Surgery

## 2023-03-04 NOTE — Telephone Encounter (Signed)
Called to see if pt wants to r/s, left vmail let pt aware it is a no show visit

## 2023-03-08 ENCOUNTER — Telehealth: Payer: Self-pay

## 2023-03-08 NOTE — Telephone Encounter (Signed)
Prior Authorization submitted and approved for Vyvanse 60 mg through 03/07/2026 with [employer].

## 2023-03-20 NOTE — Progress Notes (Signed)
 [facility] [place] [id number] [birth year] 58 y.o.  Subjective:   Patient ID:  Tabitha Bennett is a 58 y.o. (DOB [birth year]) female.  Chief Complaint:  Chief Complaint  Patient presents with   Follow-up    Mood disturbance, anxiety, insomnia, and ADHD    HPI Tabitha Bennett presents to the office today for follow-up of mood disturbance, anxiety, ADHD, and insomnia. Mood has been stable. She reports that her anxiety has been good. Sleeping well. Energy and motivation have been ok. Concentration is the same. Denies SI.   She reports that her holidays went well.   Tabitha Bennett is spending time at their house often.   Vyvanse  last filled 03/04/23.  Has a  grandson that is 2 months old.   Past medication trials: Celexa  Lexapro Paxil Prozac Sertraline Effexor Pristiq  Trintellix  Wellbutrin Lamictal  Trileptal  Vraylar-weight gain Latuda - Reports side effects (akathisia, agitation) with 60 mg. Constipation at 60 mg.  Rexulti-tremor Caplyta  Adderall XR Adderall- Agitation Vyvanse  Mydayis  Ritalin Xanax  Trazodone  Buspar     AIMS    Flowsheet Row Office Visit from 03/21/2023 in [facility] Office Visit from 01/10/2023 in [facility] Office Visit from 09/27/2022 in [facility] Office Visit from 08/29/2022 in [facility] Office Visit from 05/29/2022 in [facility]  AIMS Total Score 2 2 9 6 4         Review of Systems:  Review of Systems  Cardiovascular:  Negative for palpitations.  Musculoskeletal:  Negative for gait problem.  Neurological:  Positive for tremors.  Psychiatric/Behavioral:         Please refer to HPI    Medications: I have reviewed the patient's current medications.  Current Outpatient Medications  Medication Sig Dispense Refill   ascorbic acid (VITAMIN C) 500 MG tablet Take 500 mg by mouth  daily.     estradiol (ESTRACE) 0.5 MG tablet Take by mouth.     [START ON 05/27/2023] lisdexamfetamine (VYVANSE ) 60 MG capsule Take 1 capsule (60 mg total) by mouth every morning. 30 capsule 0   [START ON 06/24/2023] lisdexamfetamine (VYVANSE ) 60 MG capsule Take 1 capsule (60 mg total) by mouth every morning. 30 capsule 0   Multiple Vitamin (MULTIVITAMIN) tablet Take 1 tablet by mouth daily.     citalopram  (CELEXA ) 40 MG tablet Take 1/2-1 tab po qd 90 tablet 1   [START ON 03/30/2023] gabapentin  (NEURONTIN ) 300 MG capsule Take 1 capsule (300 mg total) by mouth 3 (three) times daily. 90 capsule 5   lamoTRIgine  (LAMICTAL ) 200 MG tablet Take 1 tablet (200 mg total) by mouth daily. 90 tablet 1   [START ON 03/25/2023] lisdexamfetamine (VYVANSE ) 60 MG capsule Take 1 capsule (60 mg total) by mouth every morning. 30 capsule 0   [START ON 04/29/2023] lisdexamfetamine (VYVANSE ) 60 MG capsule Take 1 capsule (60 mg total) by mouth every morning. 30 capsule 0   Lurasidone  HCl 60 MG TABS Take 1 tablet (60 mg total) by mouth every evening. 90 tablet 1   traZODone  (DESYREL ) 50 MG tablet TAKE 1 TO 2 TABLETS BY MOUTH AT BEDTIME 180 tablet 0   No current facility-administered medications for this visit.    Medication Side Effects: Other: Some restlessness and mild involuntary movements  Allergies:  Allergies  Allergen Reactions   Aripiprazole Other (See Comments)    Pt reports it makes her irritable.    Fluvoxamine Maleate Other (See Comments)    Pt reports it makes  her irritable.     Paroxetine Hcl Other (See Comments)    Pt reports it makes her irritable.     Venlafaxine Other (See Comments)    Pt reports it makes her irritable.      Past Medical History:  Diagnosis Date   Bipolar 1 disorder [facility])     Past Medical History, Surgical history, Social history, and Family history were reviewed and updated as appropriate.   Please see review of systems for further details on the patient's review from  today.   Objective:   Physical Exam:  BP 121/78   Pulse 72   Physical Exam Constitutional:      General: She is not in acute distress. Musculoskeletal:        General: No deformity.  Neurological:     Mental Status: She is alert and oriented to person, place, and time.     Coordination: Coordination normal.  Psychiatric:        Attention and Perception: Attention and perception normal. She does not perceive auditory or visual hallucinations.        Mood and Affect: Mood normal. Mood is not anxious or depressed. Affect is not labile, blunt, angry or inappropriate.        Speech: Speech normal.        Behavior: Behavior normal.        Thought Content: Thought content normal. Thought content is not paranoid or delusional. Thought content does not include homicidal or suicidal ideation. Thought content does not include homicidal or suicidal plan.        Cognition and Memory: Cognition and memory normal.        Judgment: Judgment normal.     Comments: Insight intact     Lab Review:     Component Value Date/Time   NA 138 02/13/2021 1122   K 4.5 02/13/2021 1122   CL 103 02/13/2021 1122   CO2 31 02/13/2021 1122   GLUCOSE 79 02/13/2021 1122   BUN 17 02/13/2021 1122   CREATININE 1.04 02/13/2021 1122   CALCIUM 9.1 02/13/2021 1122   PROT 6.0 11/25/2010 0942   ALBUMIN 3.0 (L) 11/25/2010 0942   AST 21 11/25/2010 0942   ALT 18 11/25/2010 0942   ALKPHOS 43 11/25/2010 0942   BILITOT 0.2 (L) 11/25/2010 0942   GFRNONAA >60 05/03/2016 1542   GFRAA >60 05/03/2016 1542       Component Value Date/Time   WBC 6.6 05/03/2016 1542   RBC 4.09 05/03/2016 1542   HGB 12.8 05/03/2016 1542   HCT 39.5 05/03/2016 1542   PLT 279 05/03/2016 1542   MCV 96.6 05/03/2016 1542   MCH 31.3 05/03/2016 1542   MCHC 32.4 05/03/2016 1542   RDW 12.3 05/03/2016 1542    Lithium  Lvl  Date Value Ref Range Status  04/22/2016 <0.06 (L) 0.60 - 1.20 mmol/L Final     No results found for: PHENYTOIN,  PHENOBARB, VALPROATE, CBMZ   .res Assessment: Plan:   Will continue current plan of care since target signs and symptoms are well controlled without any tolerability issues. Continue Latuda  60 mg daily for mood symptoms.  Continue Gabapentin  300 mg three times daily for anxiety. Continue Celexa  40 mg 1/2-1 tab daily for depression and anxiety.  Continue Lamictal  200 mg daily for mood symptoms.  Continue Vyvanse  60 mg daily for ADHD.  Continue Trazodone  50 mg 1-2 tabs at bedtime for insomnia.  Pt to follow-up in 2 months or sooner if clinically indicated.  Patient advised  to contact office with any questions, adverse effects, or acute worsening in signs and symptoms.  Tabitha Bennett was seen today for follow-up.  Diagnoses and all orders for this visit:  Anxiety disorder, unspecified type -     gabapentin  (NEURONTIN ) 300 MG capsule; Take 1 capsule (300 mg total) by mouth 3 (three) times daily. -     citalopram  (CELEXA ) 40 MG tablet; Take 1/2-1 tab po qd  Bipolar II disorder (HCC) -     lamoTRIgine  (LAMICTAL ) 200 MG tablet; Take 1 tablet (200 mg total) by mouth daily. -     Lurasidone  HCl 60 MG TABS; Take 1 tablet (60 mg total) by mouth every evening. -     traZODone  (DESYREL ) 50 MG tablet; TAKE 1 TO 2 TABLETS BY MOUTH AT BEDTIME -     citalopram  (CELEXA ) 40 MG tablet; Take 1/2-1 tab po qd  ADHD, predominantly inattentive type -     lisdexamfetamine (VYVANSE ) 60 MG capsule; Take 1 capsule (60 mg total) by mouth every morning. -     lisdexamfetamine (VYVANSE ) 60 MG capsule; Take 1 capsule (60 mg total) by mouth every morning. -     lisdexamfetamine (VYVANSE ) 60 MG capsule; Take 1 capsule (60 mg total) by mouth every morning.     Please see After Visit Summary for patient specific instructions.  No future appointments.   No orders of the defined types were placed in this encounter.   -------------------------------

## 2023-03-21 ENCOUNTER — Encounter: Payer: Self-pay | Admitting: Psychiatry

## 2023-03-21 ENCOUNTER — Ambulatory Visit (INDEPENDENT_AMBULATORY_CARE_PROVIDER_SITE_OTHER): Payer: 59 | Admitting: Psychiatry

## 2023-03-21 VITALS — BP 121/78 | HR 72

## 2023-03-21 DIAGNOSIS — F419 Anxiety disorder, unspecified: Secondary | ICD-10-CM

## 2023-03-21 DIAGNOSIS — F900 Attention-deficit hyperactivity disorder, predominantly inattentive type: Secondary | ICD-10-CM

## 2023-03-21 DIAGNOSIS — F3181 Bipolar II disorder: Secondary | ICD-10-CM | POA: Diagnosis not present

## 2023-03-21 MED ORDER — LISDEXAMFETAMINE DIMESYLATE 60 MG PO CAPS
60.0000 mg | ORAL_CAPSULE | ORAL | 0 refills | Status: AC
Start: 2023-05-27 — End: ?

## 2023-03-21 MED ORDER — GABAPENTIN 300 MG PO CAPS
300.0000 mg | ORAL_CAPSULE | Freq: Three times a day (TID) | ORAL | 5 refills | Status: AC
Start: 2023-03-30 — End: ?

## 2023-03-21 MED ORDER — CITALOPRAM HYDROBROMIDE 40 MG PO TABS: ORAL_TABLET | ORAL | 1 refills | Status: AC

## 2023-03-21 MED ORDER — LURASIDONE HCL 60 MG PO TABS: 60.0000 mg | ORAL_TABLET | Freq: Every evening | ORAL | 1 refills | Status: AC

## 2023-03-21 MED ORDER — TRAZODONE HCL 50 MG PO TABS
ORAL_TABLET | ORAL | 0 refills | Status: AC
Start: 2023-03-21 — End: ?

## 2023-03-21 MED ORDER — LAMOTRIGINE 200 MG PO TABS: 200.0000 mg | ORAL_TABLET | Freq: Every day | ORAL | 1 refills | Status: AC

## 2023-03-21 MED ORDER — LISDEXAMFETAMINE DIMESYLATE 60 MG PO CAPS: 60.0000 mg | ORAL_CAPSULE | ORAL | 0 refills | Status: AC

## 2023-03-21 MED ORDER — LISDEXAMFETAMINE DIMESYLATE 60 MG PO CAPS
60.0000 mg | ORAL_CAPSULE | ORAL | 0 refills | Status: AC
Start: 2023-06-24 — End: ?

## 2023-04-09 ENCOUNTER — Other Ambulatory Visit: Payer: Self-pay

## 2023-04-09 ENCOUNTER — Telehealth: Payer: Self-pay | Admitting: Psychiatry

## 2023-04-09 MED ORDER — LISDEXAMFETAMINE DIMESYLATE 60 MG PO CHEW: 1.0000 | CHEWABLE_TABLET | Freq: Every day | ORAL | 0 refills | Status: DC

## 2023-04-09 NOTE — Telephone Encounter (Signed)
Called patient to ask about how to send Rx for Vyvanse. Pharmacy sent a fax to report she had gotten chewable tablets the last few fills and they had in stock.  She has requested 30 mg capsules, 2 a day, which they have in stock, but may trigger a PA. She said to go ahead and send as chewables.  Pended 60 mg chewables with notation to cancel Rx for capsules. Sent to [facility] on [address].

## 2023-04-09 NOTE — Telephone Encounter (Signed)
Tabitha Bennett called and LM at 12:58 that the [facility] doesn't have the Vyvanse 60mg . They do have 30mg .  She is asking that you send in a new prescription for 30mg  2/day,  send to [id number] - [place], [place] - [address]

## 2023-05-13 ENCOUNTER — Telehealth: Payer: Self-pay | Admitting: Psychiatry

## 2023-05-13 ENCOUNTER — Other Ambulatory Visit: Payer: Self-pay

## 2023-05-13 MED ORDER — LISDEXAMFETAMINE DIMESYLATE 60 MG PO CHEW: 1.0000 | CHEWABLE_TABLET | Freq: Every day | ORAL | 0 refills | Status: AC

## 2023-05-13 NOTE — Telephone Encounter (Signed)
 LF 1/28. Has scripts but it is capsules and don't have in stock, is asking for chewables. Said she is going to follow with Shanda Bumps but doesn't have an appt yet.

## 2023-05-13 NOTE — Telephone Encounter (Signed)
 Pended Vyvanse chewable to [facility] on [address] with note to cancel capsules.

## 2023-05-13 NOTE — Telephone Encounter (Signed)
 Mystery called and LM at 4:47 stating the pharmacy did not have the Vyvanse 60mg  cap.  They do have the edible.  She requested that we send in the corrected prescription for the edible 60mg . Same pharmacy [facility] [address]
